# Patient Record
Sex: Female | Born: 1945
Health system: Southern US, Community
[De-identification: ages and names within clinical notes are randomized; demographics above are authoritative.]

## PROBLEM LIST (undated history)

## (undated) DIAGNOSIS — F32A Depression, unspecified: Secondary | ICD-10-CM

## (undated) DIAGNOSIS — M779 Enthesopathy, unspecified: Secondary | ICD-10-CM

## (undated) DIAGNOSIS — I2510 Atherosclerotic heart disease of native coronary artery without angina pectoris: Secondary | ICD-10-CM

## (undated) DIAGNOSIS — R81 Glycosuria: Secondary | ICD-10-CM

## (undated) DIAGNOSIS — N6019 Diffuse cystic mastopathy of unspecified breast: Secondary | ICD-10-CM

## (undated) DIAGNOSIS — I219 Acute myocardial infarction, unspecified: Secondary | ICD-10-CM

## (undated) DIAGNOSIS — C439 Malignant melanoma of skin, unspecified: Secondary | ICD-10-CM

## (undated) DIAGNOSIS — I10 Essential (primary) hypertension: Secondary | ICD-10-CM

## (undated) DIAGNOSIS — K219 Gastro-esophageal reflux disease without esophagitis: Secondary | ICD-10-CM

## (undated) DIAGNOSIS — M1990 Unspecified osteoarthritis, unspecified site: Secondary | ICD-10-CM

## (undated) DIAGNOSIS — F419 Anxiety disorder, unspecified: Secondary | ICD-10-CM

## (undated) DIAGNOSIS — N920 Excessive and frequent menstruation with regular cycle: Secondary | ICD-10-CM

## (undated) DIAGNOSIS — K4090 Unilateral inguinal hernia, without obstruction or gangrene, not specified as recurrent: Secondary | ICD-10-CM

## (undated) DIAGNOSIS — F329 Major depressive disorder, single episode, unspecified: Secondary | ICD-10-CM

## (undated) HISTORY — DX: Enthesopathy, unspecified: M77.9

## (undated) HISTORY — PX: NODE DISSECTION: SHX5269

## (undated) HISTORY — DX: Unilateral inguinal hernia, without obstruction or gangrene, not specified as recurrent: K40.90

## (undated) HISTORY — PX: APPENDECTOMY: SHX54

## (undated) HISTORY — DX: Gastro-esophageal reflux disease without esophagitis: K21.9

## (undated) HISTORY — DX: Malignant melanoma of skin, unspecified: C43.9

## (undated) HISTORY — PX: OTHER SURGICAL HISTORY: SHX169

## (undated) HISTORY — PX: CHOLECYSTECTOMY: SHX55

## (undated) HISTORY — DX: Excessive and frequent menstruation with regular cycle: N92.0

## (undated) HISTORY — PX: TOTAL ABDOMINAL HYSTERECTOMY: SHX209

## (undated) HISTORY — PX: HERNIA REPAIR: SHX51

## (undated) HISTORY — DX: Glycosuria: R81

## (undated) HISTORY — DX: Diffuse cystic mastopathy of unspecified breast: N60.19

---

## 1996-05-15 HISTORY — PX: HYSTEROSCOPY: SHX211

## 1997-04-14 DIAGNOSIS — I219 Acute myocardial infarction, unspecified: Secondary | ICD-10-CM

## 1997-04-14 HISTORY — DX: Acute myocardial infarction, unspecified: I21.9

## 1997-08-22 ENCOUNTER — Other Ambulatory Visit: Admission: RE | Admit: 1997-08-22 | Discharge: 1997-08-22 | Payer: Self-pay | Admitting: Obstetrics and Gynecology

## 1997-09-26 ENCOUNTER — Inpatient Hospital Stay (HOSPITAL_COMMUNITY): Admission: RE | Admit: 1997-09-26 | Discharge: 1997-09-28 | Payer: Self-pay | Admitting: Obstetrics and Gynecology

## 1998-01-12 HISTORY — PX: CORONARY ANGIOPLASTY WITH STENT PLACEMENT: SHX49

## 1998-01-25 ENCOUNTER — Encounter: Payer: Self-pay | Admitting: Cardiology

## 1998-01-25 ENCOUNTER — Inpatient Hospital Stay (HOSPITAL_COMMUNITY): Admission: EM | Admit: 1998-01-25 | Discharge: 1998-01-27 | Payer: Self-pay | Admitting: Emergency Medicine

## 1998-02-06 ENCOUNTER — Encounter (HOSPITAL_COMMUNITY): Admission: RE | Admit: 1998-02-06 | Discharge: 1998-05-07 | Payer: Self-pay | Admitting: Cardiology

## 1998-04-14 HISTORY — PX: ANGIOPLASTY: SHX39

## 1998-08-28 ENCOUNTER — Ambulatory Visit (HOSPITAL_COMMUNITY): Admission: RE | Admit: 1998-08-28 | Discharge: 1998-08-29 | Payer: Self-pay | Admitting: Cardiology

## 1998-09-25 ENCOUNTER — Encounter (HOSPITAL_COMMUNITY): Admission: RE | Admit: 1998-09-25 | Discharge: 1998-12-24 | Payer: Self-pay | Admitting: Cardiology

## 1998-12-25 ENCOUNTER — Encounter (HOSPITAL_COMMUNITY): Admission: RE | Admit: 1998-12-25 | Discharge: 1999-03-25 | Payer: Self-pay | Admitting: Cardiology

## 1999-08-01 ENCOUNTER — Ambulatory Visit (HOSPITAL_COMMUNITY): Admission: RE | Admit: 1999-08-01 | Discharge: 1999-08-01 | Payer: Self-pay | Admitting: Obstetrics and Gynecology

## 1999-08-01 ENCOUNTER — Encounter: Payer: Self-pay | Admitting: Obstetrics and Gynecology

## 2000-05-13 ENCOUNTER — Ambulatory Visit (HOSPITAL_COMMUNITY): Admission: RE | Admit: 2000-05-13 | Discharge: 2000-05-14 | Payer: Self-pay | Admitting: Cardiology

## 2000-05-27 ENCOUNTER — Inpatient Hospital Stay (HOSPITAL_COMMUNITY): Admission: AD | Admit: 2000-05-27 | Discharge: 2000-05-30 | Payer: Self-pay | Admitting: Cardiology

## 2000-05-28 ENCOUNTER — Encounter: Payer: Self-pay | Admitting: Cardiology

## 2000-05-29 ENCOUNTER — Encounter: Payer: Self-pay | Admitting: Cardiology

## 2000-06-09 ENCOUNTER — Encounter (HOSPITAL_COMMUNITY): Admission: RE | Admit: 2000-06-09 | Discharge: 2000-07-31 | Payer: Self-pay | Admitting: Cardiology

## 2000-11-23 ENCOUNTER — Other Ambulatory Visit: Admission: RE | Admit: 2000-11-23 | Discharge: 2000-11-23 | Payer: Self-pay | Admitting: Obstetrics and Gynecology

## 2001-05-16 ENCOUNTER — Emergency Department (HOSPITAL_COMMUNITY): Admission: EM | Admit: 2001-05-16 | Discharge: 2001-05-16 | Payer: Self-pay | Admitting: Emergency Medicine

## 2001-05-23 ENCOUNTER — Encounter: Payer: Self-pay | Admitting: Emergency Medicine

## 2001-05-23 ENCOUNTER — Inpatient Hospital Stay (HOSPITAL_COMMUNITY): Admission: EM | Admit: 2001-05-23 | Discharge: 2001-05-24 | Payer: Self-pay | Admitting: Emergency Medicine

## 2001-05-24 ENCOUNTER — Encounter: Payer: Self-pay | Admitting: Emergency Medicine

## 2001-05-27 ENCOUNTER — Ambulatory Visit (HOSPITAL_COMMUNITY): Admission: RE | Admit: 2001-05-27 | Discharge: 2001-05-27 | Payer: Self-pay | Admitting: Cardiology

## 2002-10-13 DIAGNOSIS — K4090 Unilateral inguinal hernia, without obstruction or gangrene, not specified as recurrent: Secondary | ICD-10-CM

## 2002-10-13 HISTORY — DX: Unilateral inguinal hernia, without obstruction or gangrene, not specified as recurrent: K40.90

## 2002-10-19 ENCOUNTER — Ambulatory Visit (HOSPITAL_COMMUNITY): Admission: RE | Admit: 2002-10-19 | Discharge: 2002-10-19 | Payer: Self-pay

## 2003-04-26 ENCOUNTER — Ambulatory Visit (HOSPITAL_COMMUNITY): Admission: RE | Admit: 2003-04-26 | Discharge: 2003-04-26 | Payer: Self-pay | Admitting: Cardiology

## 2005-09-29 ENCOUNTER — Ambulatory Visit (HOSPITAL_COMMUNITY): Admission: RE | Admit: 2005-09-29 | Discharge: 2005-09-29 | Payer: Self-pay | Admitting: Gastroenterology

## 2008-10-12 DIAGNOSIS — C439 Malignant melanoma of skin, unspecified: Secondary | ICD-10-CM

## 2008-10-12 HISTORY — DX: Malignant melanoma of skin, unspecified: C43.9

## 2008-10-30 ENCOUNTER — Encounter: Admission: RE | Admit: 2008-10-30 | Discharge: 2008-10-30 | Payer: Self-pay | Admitting: General Surgery

## 2008-10-30 ENCOUNTER — Ambulatory Visit (HOSPITAL_COMMUNITY): Admission: RE | Admit: 2008-10-30 | Discharge: 2008-10-30 | Payer: Self-pay | Admitting: General Surgery

## 2008-11-01 ENCOUNTER — Encounter (INDEPENDENT_AMBULATORY_CARE_PROVIDER_SITE_OTHER): Payer: Self-pay | Admitting: General Surgery

## 2008-11-01 ENCOUNTER — Ambulatory Visit (HOSPITAL_BASED_OUTPATIENT_CLINIC_OR_DEPARTMENT_OTHER): Admission: RE | Admit: 2008-11-01 | Discharge: 2008-11-01 | Payer: Self-pay | Admitting: General Surgery

## 2008-11-20 ENCOUNTER — Ambulatory Visit: Payer: Self-pay | Admitting: Oncology

## 2008-11-30 LAB — CBC WITH DIFFERENTIAL/PLATELET
BASO%: 0.5 % (ref 0.0–2.0)
Basophils Absolute: 0 10*3/uL (ref 0.0–0.1)
EOS%: 1.2 % (ref 0.0–7.0)
Eosinophils Absolute: 0.1 10*3/uL (ref 0.0–0.5)
HCT: 43.3 % (ref 34.8–46.6)
HGB: 14.8 g/dL (ref 11.6–15.9)
LYMPH%: 33.6 % (ref 14.0–49.7)
MCH: 31.3 pg (ref 25.1–34.0)
MCHC: 34.1 g/dL (ref 31.5–36.0)
MCV: 91.7 fL (ref 79.5–101.0)
MONO#: 0.4 10*3/uL (ref 0.1–0.9)
MONO%: 6.9 % (ref 0.0–14.0)
NEUT#: 3.5 10*3/uL (ref 1.5–6.5)
NEUT%: 57.8 % (ref 38.4–76.8)
Platelets: 208 10*3/uL (ref 145–400)
RBC: 4.73 10*6/uL (ref 3.70–5.45)
RDW: 13 % (ref 11.2–14.5)
WBC: 6 10*3/uL (ref 3.9–10.3)
lymph#: 2 10*3/uL (ref 0.9–3.3)

## 2008-11-30 LAB — COMPREHENSIVE METABOLIC PANEL
ALT: 36 U/L — ABNORMAL HIGH (ref 0–35)
AST: 23 U/L (ref 0–37)
Albumin: 4.2 g/dL (ref 3.5–5.2)
Alkaline Phosphatase: 68 U/L (ref 39–117)
BUN: 13 mg/dL (ref 6–23)
CO2: 25 mEq/L (ref 19–32)
Calcium: 9.3 mg/dL (ref 8.4–10.5)
Chloride: 104 mEq/L (ref 96–112)
Creatinine, Ser: 0.63 mg/dL (ref 0.40–1.20)
Glucose, Bld: 103 mg/dL — ABNORMAL HIGH (ref 70–99)
Potassium: 3.9 mEq/L (ref 3.5–5.3)
Sodium: 142 mEq/L (ref 135–145)
Total Bilirubin: 0.9 mg/dL (ref 0.3–1.2)
Total Protein: 6.5 g/dL (ref 6.0–8.3)

## 2008-11-30 LAB — LACTATE DEHYDROGENASE: LDH: 165 U/L (ref 94–250)

## 2009-06-08 ENCOUNTER — Ambulatory Visit: Payer: Self-pay | Admitting: Oncology

## 2009-11-05 ENCOUNTER — Ambulatory Visit (HOSPITAL_COMMUNITY): Admission: RE | Admit: 2009-11-05 | Discharge: 2009-11-05 | Payer: Self-pay | Admitting: Oncology

## 2009-11-12 ENCOUNTER — Ambulatory Visit: Payer: Self-pay | Admitting: Oncology

## 2010-05-06 ENCOUNTER — Encounter: Payer: Self-pay | Admitting: General Surgery

## 2010-05-09 ENCOUNTER — Other Ambulatory Visit: Payer: Self-pay | Admitting: Dermatology

## 2010-06-05 ENCOUNTER — Other Ambulatory Visit: Payer: Self-pay | Admitting: Medical

## 2010-06-05 ENCOUNTER — Encounter (HOSPITAL_BASED_OUTPATIENT_CLINIC_OR_DEPARTMENT_OTHER): Payer: BC Managed Care – PPO | Admitting: Oncology

## 2010-06-05 DIAGNOSIS — C4360 Malignant melanoma of unspecified upper limb, including shoulder: Secondary | ICD-10-CM

## 2010-06-05 LAB — LACTATE DEHYDROGENASE: LDH: 157 U/L (ref 94–250)

## 2010-06-05 LAB — CBC WITH DIFFERENTIAL/PLATELET
BASO%: 0.4 % (ref 0.0–2.0)
Basophils Absolute: 0 10*3/uL (ref 0.0–0.1)
EOS%: 1.1 % (ref 0.0–7.0)
Eosinophils Absolute: 0.1 10*3/uL (ref 0.0–0.5)
HCT: 42.2 % (ref 34.8–46.6)
HGB: 14.6 g/dL (ref 11.6–15.9)
LYMPH%: 34.7 % (ref 14.0–49.7)
MCH: 31.6 pg (ref 25.1–34.0)
MCHC: 34.6 g/dL (ref 31.5–36.0)
MCV: 91.3 fL (ref 79.5–101.0)
MONO#: 0.3 10*3/uL (ref 0.1–0.9)
MONO%: 6.2 % (ref 0.0–14.0)
NEUT#: 3.1 10*3/uL (ref 1.5–6.5)
NEUT%: 57.6 % (ref 38.4–76.8)
Platelets: 238 10*3/uL (ref 145–400)
RBC: 4.63 10*6/uL (ref 3.70–5.45)
RDW: 13.1 % (ref 11.2–14.5)
WBC: 5.5 10*3/uL (ref 3.9–10.3)
lymph#: 1.9 10*3/uL (ref 0.9–3.3)

## 2010-06-05 LAB — COMPREHENSIVE METABOLIC PANEL
ALT: 42 U/L — ABNORMAL HIGH (ref 0–35)
AST: 32 U/L (ref 0–37)
Albumin: 3.8 g/dL (ref 3.5–5.2)
Alkaline Phosphatase: 60 U/L (ref 39–117)
BUN: 10 mg/dL (ref 6–23)
CO2: 26 mEq/L (ref 19–32)
Calcium: 9.3 mg/dL (ref 8.4–10.5)
Chloride: 103 mEq/L (ref 96–112)
Creatinine, Ser: 0.69 mg/dL (ref 0.40–1.20)
Glucose, Bld: 135 mg/dL — ABNORMAL HIGH (ref 70–99)
Potassium: 3.3 mEq/L — ABNORMAL LOW (ref 3.5–5.3)
Sodium: 138 mEq/L (ref 135–145)
Total Bilirubin: 1.1 mg/dL (ref 0.3–1.2)
Total Protein: 6.8 g/dL (ref 6.0–8.3)

## 2010-07-21 LAB — CBC
HCT: 41.8 % (ref 36.0–46.0)
Hemoglobin: 14.5 g/dL (ref 12.0–15.0)
MCHC: 34.7 g/dL (ref 30.0–36.0)
MCV: 93.3 fL (ref 78.0–100.0)
Platelets: 204 10*3/uL (ref 150–400)
RBC: 4.49 MIL/uL (ref 3.87–5.11)
RDW: 13 % (ref 11.5–15.5)
WBC: 6.2 10*3/uL (ref 4.0–10.5)

## 2010-07-21 LAB — COMPREHENSIVE METABOLIC PANEL
ALT: 40 U/L — ABNORMAL HIGH (ref 0–35)
AST: 36 U/L (ref 0–37)
Albumin: 3.7 g/dL (ref 3.5–5.2)
Alkaline Phosphatase: 62 U/L (ref 39–117)
BUN: 7 mg/dL (ref 6–23)
CO2: 26 mEq/L (ref 19–32)
Calcium: 9 mg/dL (ref 8.4–10.5)
Chloride: 105 mEq/L (ref 96–112)
Creatinine, Ser: 0.67 mg/dL (ref 0.4–1.2)
GFR calc Af Amer: >60 mL/min (ref >60)
GFR calc non Af Amer: >60 mL/min (ref >60)
Glucose, Bld: 122 mg/dL — ABNORMAL HIGH (ref 70–99)
Potassium: 3.4 mEq/L — ABNORMAL LOW (ref 3.5–5.1)
Sodium: 139 mEq/L (ref 135–145)
Total Bilirubin: 1.1 mg/dL (ref 0.3–1.2)
Total Protein: 6.5 g/dL (ref 6.0–8.3)

## 2010-07-21 LAB — DIFFERENTIAL
Basophils Absolute: 0 10*3/uL (ref 0.0–0.1)
Basophils Relative: 0 % (ref 0–1)
Eosinophils Absolute: 0.1 10*3/uL (ref 0.0–0.7)
Eosinophils Relative: 2 % (ref 0–5)
Lymphocytes Relative: 35 % (ref 12–46)
Lymphs Abs: 2.2 10*3/uL (ref 0.7–4.0)
Monocytes Absolute: 0.5 10*3/uL (ref 0.1–1.0)
Monocytes Relative: 8 % (ref 3–12)
Neutro Abs: 3.5 10*3/uL (ref 1.7–7.7)
Neutrophils Relative %: 56 % (ref 43–77)

## 2010-08-27 NOTE — Op Note (Signed)
[name], [name]               ACCOUNT NO.:  [id number]   MEDICAL RECORD NO.:  [id number]          PATIENT TYPE:  AMB   LOCATION:  DSC                          FACILITY:  [facility]   PHYSICIAN:  Juanetta Gosling, MDDATE OF BIRTH:  [birth year]   DATE OF PROCEDURE:  11/01/2008  DATE OF DISCHARGE:                               OPERATIVE REPORT   PREOPERATIVE DIAGNOSES:  1. Melanoma in situ, right arm.  2. A 1.1 mm thick right shoulder melanoma.   POSTOPERATIVE DIAGNOSES:  1. Melanoma in situ, right arm.  2. A 1.1 mm thick right shoulder melanoma.   PROCEDURES:  1. Right arm melanoma in situ excision with 0.5 cm margins.  2. Injection of methylene blue dye for sentinel lymph node biopsy.  3. Right axillary sentinel lymph node biopsy.  4. Right shoulder melanoma excision with 1-cm margins.   SURGEON:  Troy Sine. Dwain Sarna, MD   ASSISTANT:  None.   ANESTHESIA:  General.   SPECIMENS:  1. Right arm melanoma.  2. Right axillary sentinel lymph node.  3. Right axillary lymph node.  4. Right axillary tissue.  5. Right shoulder melanoma, wide local excision, all to pathology.   ESTIMATED BLOOD LOSS:  Minimal.   COMPLICATIONS:  None.   DRAINS:  None.   DISPOSITION:  To recovery room in stable condition.   HISTORY:  [name] is a 65 year old female with a history of  coronary artery disease who underwent an 8-mm punch biopsy of a right  shoulder lesion as well as a shave biopsy of her right arm lesion on  October 13, 2008.  Pathology on the right shoulder lesion was a 1.1 mm thick  melanoma with no regression, no ulceration, no satellitosis, and no  vascular invasion identified.  The margin did appear free on this  limited evaluation.  The shave biopsy on the right arm showed a melanoma  in situ.  She and I discussed excision of both of these lesions with a  right axillary sentinel lymph node biopsy.  She underwent a  lymphoscintigram preoperatively which localized to her  right axilla.  We  discussed the risks and benefits of these procedures prior to beginning.   PROCEDURE IN DETAIL:  After informed consent was obtained, the patient  was first injected with technetium surrounding her right shoulder  melanoma.  She was administered 400 mg of IV ciprofloxacin due to a  penicillin allergy and had sequential compression devices placed on her  lower extremities prior to operation.  She then had an LMA placed and  underwent general anesthesia without complication.  Her entire right arm  and right axilla were then prepped with ChloraPrep.  She was then draped  in a standard sterile surgical fashion including a stockinette after 3  minutes were allowed to pass.  A surgical time-out was then performed.  Prior to this also, I injected 1 mL of methylene blue surrounding the  melanoma on the right shoulder.  I first excised the melanoma in situ in the right forearm with 0.5 cm  margins.  This was excised in total.  This was  then closed with a layer  of 3-0 Vicryl followed by 4-0 Monocryl.  Steri-Strips were placed  overlying this wound.  I then made an incision in her right axilla where it appeared that a  sentinel node was.  This was about 3-cm incision due to her body  habitus.  This dissection was carried out down into the axilla.  I  noticed two abnormal lymph nodes that I removed.  One of these appeared  to be the sentinel lymph node.  I also noticed one other smaller lymph  node upon evaluation and removed this is as a nonsentinel lymph node.  There were no background counts upon completion of this.  I did not  locate any other blue nodes in her axilla either.  This was closed with  a 2-0 Vicryl for the deep layer for the axillary fascia and then a 4-0  Monocryl was used to close the skin.  Steri-Strips and sterile dressing  were then placed.  She was then repositioned in the lateral position at about 45 degrees  with her arm down by her side and  appropriately padded.  I marked out 1-  cm margins surrounding the melanoma that she had.  There was a larger  area and a nodule present.  I went 1 cm out from the edges of the larger  area.  She had very much requested and I tried not to put this incision  where her bra strap would be resting.  This did not necessarily go with  the resting skin tension lines but I did make an attempt to put this out  of where her bra strap would reside upon completion of this.  This was  about a 3 x 9 cm elliptical incision down to level of the fascia that  was passed off the table.  I then closed this in 3 layers with a 2-0  Vicryl after developing flaps in all directions.  The dermis was then  closed with a 3-0 Vicryl and the skin was closed with a 2-0 nylon  vertical mattress sutures to bring this together.  Bacitracin and a  sterile dressing were then placed over this.  She was then rolled  supine, had her LMA removed, having tolerated this well.  She was  transferred to the recovery room in stable condition.      Juanetta Gosling, MD  Electronically Signed     MCW/MEDQ  D:  11/01/2008  T:  11/02/2008  Job:  [id number]   cc:   Venancio Poisson, M.D.  Vikki Ports, M.D.

## 2010-08-30 NOTE — H&P (Signed)
[place]. [facility]  Patient:    [name], BIER Visit Number: [id number] MRN: [id number]          Service Type: Attending:  Meade Maw, M.D. Dictated by:   Meade Maw, M.D.   CC:         Francisca December, M.D.   History and Physical  PRIMARY CARDIOLOGIST:  Dr. Francisca December.  REASON FOR ADMISSION:  Unstable angina.  HISTORY:  [name] is a 65 year old female with known coronary artery disease.  Her last hospital admission of February of 2002.  At this time, she was found to have significant disease in the ramus intermediate requiring PTCA and cutting balloon as well as PTCA with cutting balloon of the left circumflex vessel.  She has been doing well since this admission, last saw Dr. Amil Amen in January of 2003; at this time, he discontinued her Isordil mononitrate and started Isordil dinitrate.  The night prior to admission, she awoke at 12:15 with substernal chest pain which radiated to the back.  This pain was not entirely like her previous pain, in that it was more epigastric rather than lower sternal, however, she took several nitroglycerin sprays, obtained some relief and continued to use nitroglycerin spray throughout the night; she used a total of one-half bottle of nitroglycerin spray.  Currently, she is pain-free.  REVIEW OF SYSTEMS:  She feels as if her heart races at night.  She has had some shortness of breath.  She has had no pedal edema.  She had a fall this past Sunday which resulted in trauma to her left eye and her left knee.  This has subsequently prevented her normal activities this past week.  Her activity has been limited by left knee pain.  She has been compliant with her current medications.  There was associated nausea with the chest pain but no emesis. There was no orthopnea, no PND, no change in bowel or bladder habits.  CURRENT MEDICATIONS: 1. Toprol 100 mg p.o. q.d. 2. Lipitor 10 mg p.o. q.d. 3. Protonix 40  mg p.o. q.d. 4. Hydrochlorothiazide 12.5 mg p.o. q.d. 5. Estrogen q.d. 6. Isosorbide dinitrate 40 mg b.i.d. 7. Norvasc 5 mg q.d. 8. Aspirin 325 mg q.d.   ALLERGIES: 1. MORPHINE SULFATE. 2. TETANUS TOXOID. 3. AZITHROMYCIN.  PAST SURGICAL HISTORY: 1. Coronary artery disease, status post coronary angiography as noted above. 2. Appendectomy. 3. Cholecystectomy. 4. Hysterectomy with unilateral oophorectomy.  FAMILY HISTORY:  Mother passed at age 49 of stroke.  Her first cardiovascular event was in her 45s.  The patients father has coronary artery disease, status post MI in his 60s.  SOCIAL HISTORY:  No history of alcohol, tobacco or illicit drug use.  The patient works as a Geologist, engineering for a second grade.  PHYSICAL EXAMINATION:  GENERAL:  Exam reveals a middle-aged obese female currently pain-free.  VITAL SIGNS:  Blood pressure is 136/84.  Heart rate is 88.  She is afebrile.  HEENT:  Remarkable for significant ecchymosis of her left eye and left periorbital swelling.  Of note, she has been evaluated by an ophthalmologist this past weeks and was informed that her eye was okay.  NECK:  There is no neck vein distention, no bruits.  Good carotid upstrokes. Thyroid is not palpable.  CARDIOVASCULAR:  Normal S1, normal S2, regular rate and rhythm.  No murmurs, rubs, or gallops are noted.  PMI is difficult to palpate secondary to pendulous breasts.  ABDOMEN:  Obese.  No unusual bruits or  pulsations are noted.  Normal bowel sounds.  EXTREMITIES:  No clubbing, cyanosis or edema.  Good capillary refill.  NEUROLOGIC:  Nonfocal.  Motor is 5/5 throughout.  SKIN:  Warm and dry.  LABORATORY AND ACCESSORY DATA:  White count is 7.2, hemoglobin is 13, platelet count is 227,000; sodium is 138, potassium is 3.4, creatinine is 0.9; pH is 7.42 with a PCO2 of 46.  ECG reveals a normal sinus rhythm, normal ECG.  Chest x-ray is within normal limits.  IMPRESSION: 33. A 65 year old  female with current chest/epigastric pain radiating to her    back, not entirely like her previous anginal pain but similar enough to    promote her to use one-half bottle of nitroglycerin spray.  Currently, she    is pain-free.  Her cardiac workup has been negative.  She will be admitted    for further observation.  Serial cardiac enzymes will be obtained.  Further    workup at the discretion of Dr. Amil Amen. 2. Gastroesophageal reflux.  We will continue with her Protonix 40 mg daily. 3. Dyslipidemia.  We will continue with her Lipitor 10 mg daily. 4. Hypertension.  Her blood pressure currently is well-controlled on her    current regimen. 5. Mild hypokalemia.  She will be started on potassium 20 mEq daily.Dictated by:   Meade Maw, M.D. Attending:  Meade Maw, M.D. DD:  05/23/01 TD:  05/23/01 Job: [id number]

## 2010-08-30 NOTE — Cardiovascular Report (Signed)
[place]. [facility]  Patient:    [name], [name] Visit Number: [id number] MRN: [id number]          Service Type: Attending:  Francisca December, M.D. Dictated by:   Francisca December, M.D. Proc. Date: 05/27/01   CC:         Vikki Ports, M.D.  Cardiac Catheterization Laboratory   Cardiac Catheterization  PROCEDURES PERFORMED: 1. Left heart catheterization. 2. Coronary angiography 3. Left ventriculogram. 4. Right femoral artery percutaneous closure.  INDICATIONS:  The patient is a 65 year old woman who has previously undergone percutaneous revascularization of the LAD with in-stent restenosis treated in May 2000.  On May 13, 2000, she underwent cutting balloon angioplasty of the ramus intermedius and proximal left circumflex.  She was admitted five days ago with a prolonged episode of unstable angina.  A myocardial infarction was ruled out.  She underwent a myocardial perfusion study which showed reversible lateral wall defect.  She is brought now to the catheterization laboratory to identify possible progressive disease, late restenosis, and provide further therapeutic options.  DESCRIPTION OF PROCEDURE:  Left heart catheterization was performed following the percutaneous insertion of a 5 French catheter sheath utilizing the an anterior approach over a guiding J-wire into the right femoral artery.  A 110 cm pigtail catheter was used to measure pressures in the ascending aorta and in the left ventricle both prior to and following the ventriculogram.  A 30 degree cine left ventriculogram was performed utilizing a power injector. Following the sublingual administration of 0.4 mg of nitroglycerin coronary angiography was performed using 5 [place] #4 left and right Judkins catheters. Cine angiography of each coronary artery was conducted in multiple LAD and RAO projections.  At the completion of the procedure, the catheter sheath was removed and the  arteriotomy site was percutaneously closed using the Perclose system.  A right femoral angiogram documented the arteriotomy site to be well above the bifurcation of the profunda femoris and the superficial femoral arteries.  There was good hemostasis and an intact distal pulse at the completion.  HEMODYNAMICS: Systemic arterial pressure was 134/72 with a mean of 99 mmHg.  There was no systolic gradient across the aortic valve.  The left ventricular and end-diastolic pressure was 6-10 mmHg.  CORONARY ANGIOGRAPHY:  The left ventriculogram demonstrated normal chamber size and normal global systolic function with a visual estimated ejection fraction of 75%.  There was no mitral regurgitation and no coronary calcification seen.  A stent was easily visible in the mid portion of the left anterior descending artery.  There was a right dominant coronary system and the left main coronary artery was normal.  The left anterior descending artery and its branches appeared normal. There was stented segment in the mid portion of the origin of the large diagonal branch and it was widely patent. The large diagonal branch itself was widely patent without any significant obstruction.  The ongoing anterior descending artery was large, barely traversed the apex and without significant obstruction.  There was a 20% eccentric stenosis in the LAD opposite the origin of the large diagonal branch.  The left circumflex coronary artery and its branches were mildly diseased. The vessel contains a proximal 30% stenosis which starts just beyond the ostium and the site of previous angioplasty.  There was a large first marginal branch/ramus intermedius which has a 50% stenosis at its origin.  The ongoing circumflex gives rise to a small third marginal branch and a moderate to large size  true obtuse marginal branch, all of which are without significant obstruction.  The right coronary artery and its branches are  again without significant obstruction and really appears generally normal.  There is a 20% stenosis in the proximal portion of the vessel.  The ongoing right coronary artery shows no obstruction or luminal irregularity.  It gives rise to a large moderate size posterior descending artery which has a very proximal tandem smaller branch.  The posterior lateral segment is small and gives rise to a small posterior lateral branch only.  FINAL IMPRESSION: 1. Atherosclerotic coronary vascular disease, two vessel. 2. Widely patent stent side, mid left anterior descending. 3. Widely patent percutaneous transluminal coronary angioplasty site of    proximal/ostial left circumflex. 4. Mild restenosis ostium, ramus intermedius.  PLAN/RECOMMENDATION:  The patient is presented with this gratifying news.  The reversible defect on the Cardiolite was falsely positive.  It was more wide spread than could be explained by this small ramus intermedius 50% stenosis. Medical therapy only is indicated. Dictated by:   Francisca December, M.D. Attending:  Francisca December, M.D. DD:  05/27/01 TD:  05/28/01 Job: [id number]

## 2010-08-30 NOTE — Discharge Summary (Signed)
[place]. [facility]  Patient:    [name], [name]                      MRN: [id number] Adm. Date:  [id number] Disc. Date: [id number] Attending:  Corliss Marcus Dictator:   Gwenlyn Perking, M.D.                           Discharge Summary  ADMISSION DIAGNOSES: 1. Atherosclerotic coronary vascular disease two vessel with one week history    of unstable angina status post percutaneous transluminal coronary    angioplasty and cutting balloon ramus intermediate May 13, 2000, status    post percutaneous transluminal coronary angioplasty cutting balloon    proximal left circumflex May 13, 2000, status post percutaneous    transluminal coronary angioplasty and stent in the left 1999, and status    post percutaneous transluminal coronary angioplasties for in-stent    restenosis in May 2000. 2. Gastroesophageal reflux disease. 3. Dyslipidemia. 4. Obesity. 5. Positive treadmill exercise study May 11, 2000. 6. Osteoarthritis.  DISCHARGE DIAGNOSES: 1. Atherosclerotic coronary vascular disease status post cardiac    catheterization. 2. Gastroesophageal reflux. 3. Dyslipidemia. 4. Obesity. 5. Osteoarthritis.  CONSULTS:  None.  PROCEDURE:  Cardiac catheterization May 28, 2000.  HISTORY AND PHYSICAL EXAMINATION:  See admission history and physical.  HOSPITAL COURSE:  The patient was admitted on May 27, 2000 with a recent cardiac catheterization May 13, 2000 with a one week history of changing in pattern of her angina.  This was felt to be worrisome on admission in a patient with known coronary artery disease despite a recent catheterization. Therefore, the patient was admitted and in fact a cardiac catheterization was performed on May 28, 2000 again which revealed the following:  Likely chronic recurrent and persistent left main coronary artery spasm following cutting balloon dilatation of the ostium, the left circumflex, and  proximal first marginal.  Reviewing the angiograms from two weeks ago when she had the previous cardiac catheterization there was extensive manipulation of the catheter in the ostial portion of the left main with some balloon overlap into the left main from the left circumflex.  Furthermore, atherosclerotic coronary vascular disease two vessel widely patent was observed.  Non-obstructive fixed atherosclerosis of the left main coronary artery possibly worsened by "mixed angina" and an overall intact left ventricular size and systolic function were demonstrated.  Patient tolerated the cardiac catheterization well, however, topical nitroglycerin was added to her medical regimen of a beta blocker and ______.  It was felt by Dr. Amil Amen that the patient may require higher doses of the ______ as previously prescribed.  Patient was also instructed after presented with these findings that self administration of any potential vasoconstrictors to be avoided.  Furthermore, an exercise treadmill test was advised with Cardiolite myocardial perfusion imaging to rule out any occult stenosis not visualized on the cardiac catheterization which was performed on May 28, 2000.  During her hospitalization the patient did experience some nausea which was readily responsive to Zofran at first and Phenergan later. On May 30, 2000 it was decided that patient had benefited maximally from this hospital admission and could be discharged home safely after she was instructed that she needed to have the Cardiolite at a later date.  We decided to bring her back as an outpatient for a Cardiolite secondary to the nausea that the patient had experienced so on May 30, 2000 it was  decided that patient had benefited maximally from this hospital admission and could be discharged home safely.  CONDITION ON DISCHARGE:  Stable.  DISPOSITION:  Discharged patient to home.  DISCHARGE MEDICATIONS: 1. Metoprolol 100  mg one p.o. q.d. 2. Protonix 40 mg p.o. q.d. 3. Hydrochlorothiazide 12.5 mg p.o. q.d. 4. Enteric coated aspirin 325 mg one p.o. q.d. 5. Lipitor 20 mg one p.o. q.h.s. 6. Norvasc 5 mg one p.o. q.d. 7. Potassium chloride 20 mEq one p.o. q.d.  ACTIVITY:  No driving, no lifting for one week.  DIET:  Low fat, low salt.  Symptoms to warrant further treatment:  Should the patient experience any intense pain at the catheterization site, swelling, or bleeding she is to call the office immediately.  She is also to call the office if she experiences further chest pain or some to the nearest emergency room for evaluation and treatment.  FOLLOW-UP:  The patient was scheduled to come back for a followup visit with Dr. Amil Amen on June 05, 2000 at 3:15. DD:  07/21/00 TD:  07/21/00 Job: [id number]

## 2010-11-07 ENCOUNTER — Other Ambulatory Visit: Payer: Self-pay | Admitting: Dermatology

## 2010-12-04 ENCOUNTER — Ambulatory Visit (HOSPITAL_COMMUNITY)
Admission: RE | Admit: 2010-12-04 | Discharge: 2010-12-04 | Disposition: A | Discharge status: Home / Self Care | Payer: BC Managed Care – PPO | Source: Ambulatory Visit | Attending: Oncology | Admitting: Oncology

## 2010-12-04 ENCOUNTER — Other Ambulatory Visit: Payer: Self-pay | Admitting: Oncology

## 2010-12-04 ENCOUNTER — Encounter (HOSPITAL_BASED_OUTPATIENT_CLINIC_OR_DEPARTMENT_OTHER): Payer: BC Managed Care – PPO | Admitting: Oncology

## 2010-12-04 DIAGNOSIS — C801 Malignant (primary) neoplasm, unspecified: Secondary | ICD-10-CM | POA: Insufficient documentation

## 2010-12-04 DIAGNOSIS — C4360 Malignant melanoma of unspecified upper limb, including shoulder: Secondary | ICD-10-CM

## 2010-12-04 LAB — CBC WITH DIFFERENTIAL/PLATELET
BASO%: 0.5 % (ref 0.0–2.0)
Basophils Absolute: 0 10*3/uL (ref 0.0–0.1)
EOS%: 1.7 % (ref 0.0–7.0)
Eosinophils Absolute: 0.1 10*3/uL (ref 0.0–0.5)
HCT: 42.8 % (ref 34.8–46.6)
HGB: 14.8 g/dL (ref 11.6–15.9)
LYMPH%: 28.9 % (ref 14.0–49.7)
MCH: 32 pg (ref 25.1–34.0)
MCHC: 34.5 g/dL (ref 31.5–36.0)
MCV: 92.7 fL (ref 79.5–101.0)
MONO#: 0.5 10*3/uL (ref 0.1–0.9)
MONO%: 8.6 % (ref 0.0–14.0)
NEUT#: 3.8 10*3/uL (ref 1.5–6.5)
NEUT%: 60.3 % (ref 38.4–76.8)
Platelets: 229 10*3/uL (ref 145–400)
RBC: 4.62 10*6/uL (ref 3.70–5.45)
RDW: 13.2 % (ref 11.2–14.5)
WBC: 6.2 10*3/uL (ref 3.9–10.3)
lymph#: 1.8 10*3/uL (ref 0.9–3.3)

## 2010-12-04 LAB — COMPREHENSIVE METABOLIC PANEL
ALT: 37 U/L — ABNORMAL HIGH (ref 0–35)
AST: 29 U/L (ref 0–37)
Albumin: 4.2 g/dL (ref 3.5–5.2)
Alkaline Phosphatase: 62 U/L (ref 39–117)
BUN: 9 mg/dL (ref 6–23)
CO2: 26 mEq/L (ref 19–32)
Calcium: 8.9 mg/dL (ref 8.4–10.5)
Chloride: 103 mEq/L (ref 96–112)
Creatinine, Ser: 0.73 mg/dL (ref 0.50–1.10)
Glucose, Bld: 111 mg/dL — ABNORMAL HIGH (ref 70–99)
Potassium: 3.6 mEq/L (ref 3.5–5.3)
Sodium: 140 mEq/L (ref 135–145)
Total Bilirubin: 0.8 mg/dL (ref 0.3–1.2)
Total Protein: 6.7 g/dL (ref 6.0–8.3)

## 2010-12-04 LAB — LACTATE DEHYDROGENASE: LDH: 185 U/L (ref 94–250)

## 2010-12-13 ENCOUNTER — Encounter (HOSPITAL_BASED_OUTPATIENT_CLINIC_OR_DEPARTMENT_OTHER): Payer: BC Managed Care – PPO | Admitting: Oncology

## 2010-12-13 DIAGNOSIS — C4360 Malignant melanoma of unspecified upper limb, including shoulder: Secondary | ICD-10-CM

## 2011-04-25 ENCOUNTER — Telehealth: Payer: Self-pay | Admitting: Oncology

## 2011-04-25 NOTE — Telephone Encounter (Signed)
L/M ON VM RE 2/28 APPT,MAILED AS WELL   AOM PER POF FROM 12/13/10

## 2011-06-12 ENCOUNTER — Other Ambulatory Visit: Payer: Medicare Other | Admitting: Lab

## 2011-06-12 ENCOUNTER — Telehealth: Payer: Self-pay | Admitting: Oncology

## 2011-06-12 ENCOUNTER — Ambulatory Visit (HOSPITAL_BASED_OUTPATIENT_CLINIC_OR_DEPARTMENT_OTHER): Payer: Medicare Other | Admitting: Oncology

## 2011-06-12 VITALS — BP 115/65 | HR 67 | Temp 97.1°F | Ht 62.8 in | Wt 226.5 lb

## 2011-06-12 DIAGNOSIS — E785 Hyperlipidemia, unspecified: Secondary | ICD-10-CM

## 2011-06-12 DIAGNOSIS — I10 Essential (primary) hypertension: Secondary | ICD-10-CM

## 2011-06-12 DIAGNOSIS — C439 Malignant melanoma of skin, unspecified: Secondary | ICD-10-CM

## 2011-06-12 LAB — CBC WITH DIFFERENTIAL/PLATELET
BASO%: 0.5 % (ref 0.0–2.0)
Basophils Absolute: 0 10*3/uL (ref 0.0–0.1)
EOS%: 1.7 % (ref 0.0–7.0)
Eosinophils Absolute: 0.1 10*3/uL (ref 0.0–0.5)
HCT: 43.1 % (ref 34.8–46.6)
HGB: 14.6 g/dL (ref 11.6–15.9)
LYMPH%: 35.9 % (ref 14.0–49.7)
MCH: 31.5 pg (ref 25.1–34.0)
MCHC: 33.9 g/dL (ref 31.5–36.0)
MCV: 92.9 fL (ref 79.5–101.0)
MONO#: 0.5 10*3/uL (ref 0.1–0.9)
MONO%: 8 % (ref 0.0–14.0)
NEUT#: 3.3 10*3/uL (ref 1.5–6.5)
NEUT%: 53.9 % (ref 38.4–76.8)
Platelets: 225 10*3/uL (ref 145–400)
RBC: 4.64 10*6/uL (ref 3.70–5.45)
RDW: 13.7 % (ref 11.2–14.5)
WBC: 6.1 10*3/uL (ref 3.9–10.3)
lymph#: 2.2 10*3/uL (ref 0.9–3.3)

## 2011-06-12 LAB — COMPREHENSIVE METABOLIC PANEL
ALT: 42 U/L — ABNORMAL HIGH (ref 0–35)
AST: 30 U/L (ref 0–37)
Albumin: 4.2 g/dL (ref 3.5–5.2)
Alkaline Phosphatase: 66 U/L (ref 39–117)
BUN: 11 mg/dL (ref 6–23)
CO2: 28 mEq/L (ref 19–32)
Calcium: 9.5 mg/dL (ref 8.4–10.5)
Chloride: 101 mEq/L (ref 96–112)
Creatinine, Ser: 0.72 mg/dL (ref 0.50–1.10)
Glucose, Bld: 105 mg/dL — ABNORMAL HIGH (ref 70–99)
Potassium: 3.9 mEq/L (ref 3.5–5.3)
Sodium: 140 mEq/L (ref 135–145)
Total Bilirubin: 1 mg/dL (ref 0.3–1.2)
Total Protein: 6.6 g/dL (ref 6.0–8.3)

## 2011-06-12 LAB — LACTATE DEHYDROGENASE: LDH: 186 U/L (ref 94–250)

## 2011-06-12 NOTE — Progress Notes (Signed)
Hematology and Oncology Follow Up Visit  [name] [id number] Mar 04, 1946 66 y.o. 06/12/2011 2:59 PM  CC: Juanetta Gosling, MD Rebecca Der, MD [name], M.D. Rebecca Poisson, MD    Principle Diagnosis: This is a 66 year old female with a diagnosis of T2 N0, stage IB superficial spreading melanoma diagnosed July 2010.   Prior Therapy:  :  Status post wide excision followed by sentinel node biposy. Her final Clark's a level was IV with 1.4-mm depth of invasion.  Current therapy: Observation, surveillance.  Interim History: [name] presents today for a followup visit.  She has continued to do very well without any evidence to suggest relapsed or recurrent disease for her melanoma.  She had not reported any symptoms of abdominal distension or pain at this point.  She had not reported any skin rashes or lesions.  Had not had any recent change in her performance status or activity level. No skin lesions noted. She is continue to follow with Dr. Nicholas Lose  Medications: I have reviewed the patient's current medications. No current outpatient prescriptions on file.  Allergies: Allergies not on file  Past Medical History, Surgical history, Social history, and Family History were reviewed and updated.  Review of Systems: Constitutional:  Negative for fever, chills, night sweats, anorexia, weight loss, pain. Cardiovascular: no chest pain or dyspnea on exertion Respiratory: no cough, shortness of breath, or wheezing Neurological: no TIA or stroke symptoms Dermatological: negative ENT: negative Skin: Negative. Gastrointestinal: no abdominal pain, change in bowel habits, or black or bloody stools Genito-Urinary: no dysuria, trouble voiding, or hematuria Hematological and Lymphatic: negative Breast: negative Musculoskeletal: negative Remaining ROS negative. Physical Exam: Blood pressure 115/65, pulse 67, temperature 97.1 F (36.2 C), temperature source Oral, height 5'  2.8" (1.595 m), weight 226 lb 8 oz (102.74 kg). ECOG: 0 General appearance: alert Head: Normocephalic, without obvious abnormality, atraumatic Neck: no adenopathy, no carotid bruit, no JVD, supple, symmetrical, trachea midline and thyroid not enlarged, symmetric, no tenderness/mass/nodules Lymph nodes: Cervical, supraclavicular, and axillary nodes normal. Heart:regular rate and rhythm, S1, S2 normal, no murmur, click, rub or gallop Lung:chest clear, no wheezing, rales, normal symmetric air entry Abdomin: soft, non-tender, without masses or organomegaly EXT:no erythema, induration, or nodules   Lab Results: Lab Results  Component Value Date   WBC 6.1 06/12/2011   HGB 14.6 06/12/2011   HCT 43.1 06/12/2011   MCV 92.9 06/12/2011   PLT 225 06/12/2011     Chemistry      Component Value Date/Time   NA 140 12/04/2010 1321   K 3.6 12/04/2010 1321   CL 103 12/04/2010 1321   CO2 26 12/04/2010 1321   BUN 9 12/04/2010 1321   CREATININE 0.73 12/04/2010 1321      Component Value Date/Time   CALCIUM 8.9 12/04/2010 1321   ALKPHOS 62 12/04/2010 1321   AST 29 12/04/2010 1321   ALT 37* 12/04/2010 1321   BILITOT 0.8 12/04/2010 1321       Impression and Plan:  This is a 66 year old female with the following issues: 1. Stage T2 N0, stage IB Clark's level IV with depth of invasion 1.4 mm, status post wide excision.  No evidence of any metastatic disease at this point.  Plan is to continue with active surveillance, including a physical examination, liver function tests and x-rays on an annual basis.  She will continue to have dermatological examination every 6 months. 2. History of hypertension and hyperlipidemia, followed by primary care physician.  Fani Rotondo,  MD 2/28/20132:59 PM

## 2011-06-12 NOTE — Telephone Encounter (Signed)
Gv pt appt for aug2013.  Gv pt a copy of cxr orders for aug2013 @ [facility]

## 2011-11-27 ENCOUNTER — Telehealth: Payer: Self-pay | Admitting: Oncology

## 2011-11-27 ENCOUNTER — Ambulatory Visit (HOSPITAL_COMMUNITY)
Admission: RE | Admit: 2011-11-27 | Discharge: 2011-11-27 | Disposition: A | Discharge status: Home / Self Care | Payer: MEDICARE | Source: Ambulatory Visit | Attending: Oncology | Admitting: Oncology

## 2011-11-27 DIAGNOSIS — R059 Cough, unspecified: Secondary | ICD-10-CM | POA: Insufficient documentation

## 2011-11-27 DIAGNOSIS — R05 Cough: Secondary | ICD-10-CM | POA: Insufficient documentation

## 2011-11-27 DIAGNOSIS — R0602 Shortness of breath: Secondary | ICD-10-CM | POA: Insufficient documentation

## 2011-11-27 DIAGNOSIS — C439 Malignant melanoma of skin, unspecified: Secondary | ICD-10-CM

## 2011-11-27 NOTE — Telephone Encounter (Signed)
Moved 8/21 appt to 9/25. lmonvm for pt and mailed schedule.

## 2011-12-03 ENCOUNTER — Other Ambulatory Visit: Payer: Medicare Other | Admitting: Lab

## 2011-12-03 ENCOUNTER — Ambulatory Visit: Payer: Medicare Other | Admitting: Oncology

## 2011-12-24 ENCOUNTER — Other Ambulatory Visit: Payer: Self-pay | Admitting: Dermatology

## 2012-01-07 ENCOUNTER — Telehealth: Payer: Self-pay | Admitting: Oncology

## 2012-01-07 ENCOUNTER — Ambulatory Visit (HOSPITAL_BASED_OUTPATIENT_CLINIC_OR_DEPARTMENT_OTHER): Payer: Medicare Other | Admitting: Oncology

## 2012-01-07 ENCOUNTER — Other Ambulatory Visit (HOSPITAL_BASED_OUTPATIENT_CLINIC_OR_DEPARTMENT_OTHER): Payer: Medicare Other | Admitting: Lab

## 2012-01-07 VITALS — BP 127/70 | HR 60 | Temp 97.0°F | Resp 20 | Ht 62.8 in | Wt 220.1 lb

## 2012-01-07 DIAGNOSIS — C439 Malignant melanoma of skin, unspecified: Secondary | ICD-10-CM

## 2012-01-07 DIAGNOSIS — C4360 Malignant melanoma of unspecified upper limb, including shoulder: Secondary | ICD-10-CM

## 2012-01-07 LAB — COMPREHENSIVE METABOLIC PANEL (CC13)
ALT: 30 U/L (ref 0–55)
AST: 21 U/L (ref 5–34)
Albumin: 3.7 g/dL (ref 3.5–5.0)
Alkaline Phosphatase: 64 U/L (ref 40–150)
BUN: 13 mg/dL (ref 7.0–26.0)
CO2: 28 mEq/L (ref 22–29)
Calcium: 9.5 mg/dL (ref 8.4–10.4)
Chloride: 102 mEq/L (ref 98–107)
Creatinine: 0.8 mg/dL (ref 0.6–1.1)
Glucose: 101 mg/dl — ABNORMAL HIGH (ref 70–99)
Potassium: 3.4 mEq/L — ABNORMAL LOW (ref 3.5–5.1)
Sodium: 141 mEq/L (ref 136–145)
Total Bilirubin: 0.8 mg/dL (ref 0.20–1.20)
Total Protein: 6.4 g/dL (ref 6.4–8.3)

## 2012-01-07 LAB — CBC WITH DIFFERENTIAL/PLATELET
BASO%: 0.7 % (ref 0.0–2.0)
Basophils Absolute: 0 10*3/uL (ref 0.0–0.1)
EOS%: 1.8 % (ref 0.0–7.0)
Eosinophils Absolute: 0.1 10*3/uL (ref 0.0–0.5)
HCT: 43.6 % (ref 34.8–46.6)
HGB: 14.8 g/dL (ref 11.6–15.9)
LYMPH%: 33.3 % (ref 14.0–49.7)
MCH: 31.6 pg (ref 25.1–34.0)
MCHC: 34.1 g/dL (ref 31.5–36.0)
MCV: 92.7 fL (ref 79.5–101.0)
MONO#: 0.6 10*3/uL (ref 0.1–0.9)
MONO%: 8.5 % (ref 0.0–14.0)
NEUT#: 4.1 10*3/uL (ref 1.5–6.5)
NEUT%: 55.7 % (ref 38.4–76.8)
Platelets: 191 10*3/uL (ref 145–400)
RBC: 4.7 10*6/uL (ref 3.70–5.45)
RDW: 13.4 % (ref 11.2–14.5)
WBC: 7.4 10*3/uL (ref 3.9–10.3)
lymph#: 2.5 10*3/uL (ref 0.9–3.3)

## 2012-01-07 LAB — LACTATE DEHYDROGENASE (CC13): LDH: 178 U/L (ref 125–220)

## 2012-01-07 NOTE — Progress Notes (Signed)
Hematology and Oncology Follow Up Visit  [name] [id number] 1946-03-05 66 y.o. 01/07/2012 9:35 AM  CC: Rebecca Gosling, MD Rebecca Der, MD [name], M.D. Rebecca Poisson, MD    Principle Diagnosis: This is a 66 year old female with a diagnosis of T2 N0, stage IB superficial spreading melanoma diagnosed July 2010.  Prior Therapy:  :  Status post wide excision followed by sentinel node biposy. Her final Clark's a level was IV with 1.4-mm depth of invasion.  Current therapy: Observation, surveillance.  Interim History: [name] presents today for a followup visit.  She has continued to do very well without any evidence to suggest relapsed or recurrent disease for her melanoma.  She had not reported any symptoms of abdominal distension or pain at this point.  She had not reported any skin rashes or lesions.  Had not had any recent change in her performance status or activity level. No skin lesions noted. She is continue to follow with Dr. Nicholas Tran. She has not reported any abdominal pain or discomfort.   Medications: I have reviewed the patient's current medications. Current outpatient prescriptions:amLODipine (NORVASC) 10 MG tablet, Take 10 mg by mouth daily., Disp: , Rfl: ;  aspirin 325 MG tablet, Take 325 mg by mouth daily., Disp: , Rfl: ;  atorvastatin (LIPITOR) 20 MG tablet, Take 20 mg by mouth daily., Disp: , Rfl: ;  calcium citrate (CALCITRATE - DOSED IN MG ELEMENTAL CALCIUM) 950 MG tablet, Take 1 tablet by mouth daily., Disp: , Rfl:  estradiol (ESTRACE) 0.5 MG tablet, Take 0.5 mg by mouth daily., Disp: , Rfl: ;  glucosamine-chondroitin 500-400 MG tablet, Take 1 tablet by mouth 2 (two) times daily., Disp: , Rfl: ;  hydrochlorothiazide (HYDRODIURIL) 25 MG tablet, Take 25 mg by mouth daily., Disp: , Rfl: ;  isosorbide mononitrate (IMDUR) 120 MG 24 hr tablet, Take 120 mg by mouth daily., Disp: , Rfl:  lisinopril (PRINIVIL,ZESTRIL) 10 MG tablet, Take 10 mg by mouth  daily., Disp: , Rfl: ;  metoprolol (LOPRESSOR) 100 MG tablet, Take 100 mg by mouth 2 (two) times daily., Disp: , Rfl: ;  Multiple Vitamin (MULTIVITAMIN) tablet, Take 1 tablet by mouth daily., Disp: , Rfl: ;  omeprazole (PRILOSEC) 20 MG capsule, Take 20 mg by mouth 2 (two) times daily., Disp: , Rfl:  potassium chloride SA (K-DUR,KLOR-CON) 20 MEQ tablet, Take 20 mEq by mouth daily. , Disp: , Rfl: ;  Vitamin D, Ergocalciferol, (DRISDOL) 50000 UNITS CAPS, Take 50,000 Units by mouth. Every other week, Disp: , Rfl: ;  NITROSTAT 0.4 MG SL tablet, Place 0.4 mg under the tongue., Disp: , Rfl:   Allergies:  Allergies  Allergen Reactions  . Azithromycin   . Morphine And Related   . Other     Horse products  . Penicillins   . Tetanus Toxoids     Past Medical History, Surgical history, Social history, and Family History were reviewed and updated.  Review of Systems: Constitutional:  Negative for fever, chills, night sweats, anorexia, weight loss, pain. Cardiovascular: no chest pain or dyspnea on exertion Respiratory: no cough, shortness of breath, or wheezing Neurological: no TIA or stroke symptoms Dermatological: negative ENT: negative Skin: Negative. Gastrointestinal: no abdominal pain, change in bowel habits, or black or bloody stools Genito-Urinary: no dysuria, trouble voiding, or hematuria Hematological and Lymphatic: negative Breast: negative Musculoskeletal: negative Remaining ROS negative. Physical Exam: Blood pressure 127/70, pulse 60, temperature 97 F (36.1 C), temperature source Oral, resp. rate 20, height  5' 2.8" (1.595 m), weight 220 lb 1.6 oz (99.837 kg). ECOG: 0 General appearance: alert Head: Normocephalic, without obvious abnormality, atraumatic Neck: no adenopathy, no carotid bruit, no JVD, supple, symmetrical, trachea midline and thyroid not enlarged, symmetric, no tenderness/mass/nodules Lymph nodes: Cervical, supraclavicular, and axillary nodes normal. Heart:regular  rate and rhythm, S1, S2 normal, no murmur, click, rub or gallop Lung:chest clear, no wheezing, rales, normal symmetric air entry Abdomin: soft, non-tender, without masses or organomegaly EXT:no erythema, induration, or nodules   Lab Results: Lab Results  Component Value Date   WBC 7.4 01/07/2012   HGB 14.8 01/07/2012   HCT 43.6 01/07/2012   MCV 92.7 01/07/2012   PLT 191 01/07/2012     Chemistry      Component Value Date/Time   NA 140 06/12/2011 1407   K 3.9 06/12/2011 1407   CL 101 06/12/2011 1407   CO2 28 06/12/2011 1407   BUN 11 06/12/2011 1407   CREATININE 0.72 06/12/2011 1407      Component Value Date/Time   CALCIUM 9.5 06/12/2011 1407   ALKPHOS 66 06/12/2011 1407   AST 30 06/12/2011 1407   ALT 42* 06/12/2011 1407   BILITOT 1.0 06/12/2011 1407     CHEST - 2 VIEW  Comparison: PA and lateral chest 12/04/2010.  Findings: Lungs are clear. Heart size is normal. No pneumothorax  or pleural fluid. Thoracic degenerative change noted.  IMPRESSION:  No acute disease.   Impression and Plan:  This is a 66 year old female with the following issues: 1. Stage T2 N0, stage IB Clark's level IV with depth of invasion 1.4 mm, status post wide excision.  No evidence of any metastatic disease at this point.  Plan is to continue with active surveillance, including a physical examination, liver function tests and x-rays on an annual basis.  She will continue to have dermatological examination every 6 months. Her Chest xray from 11/2011 was reviewed with the patient today and is negative.   2. History of hypertension and hyperlipidemia, followed by primary care physician.  [facility], MD 9/25/20139:35 AM

## 2012-01-07 NOTE — Telephone Encounter (Signed)
gv pt appt schedule for march 2014 °

## 2012-01-29 IMAGING — CR DG CHEST 2V
2 series · 2 of 2 positions shown · non-contrast
Comparison: 11/05/2009

CLINICAL DATA: Cancer

CHEST - 2 VIEW

[w chest pa]
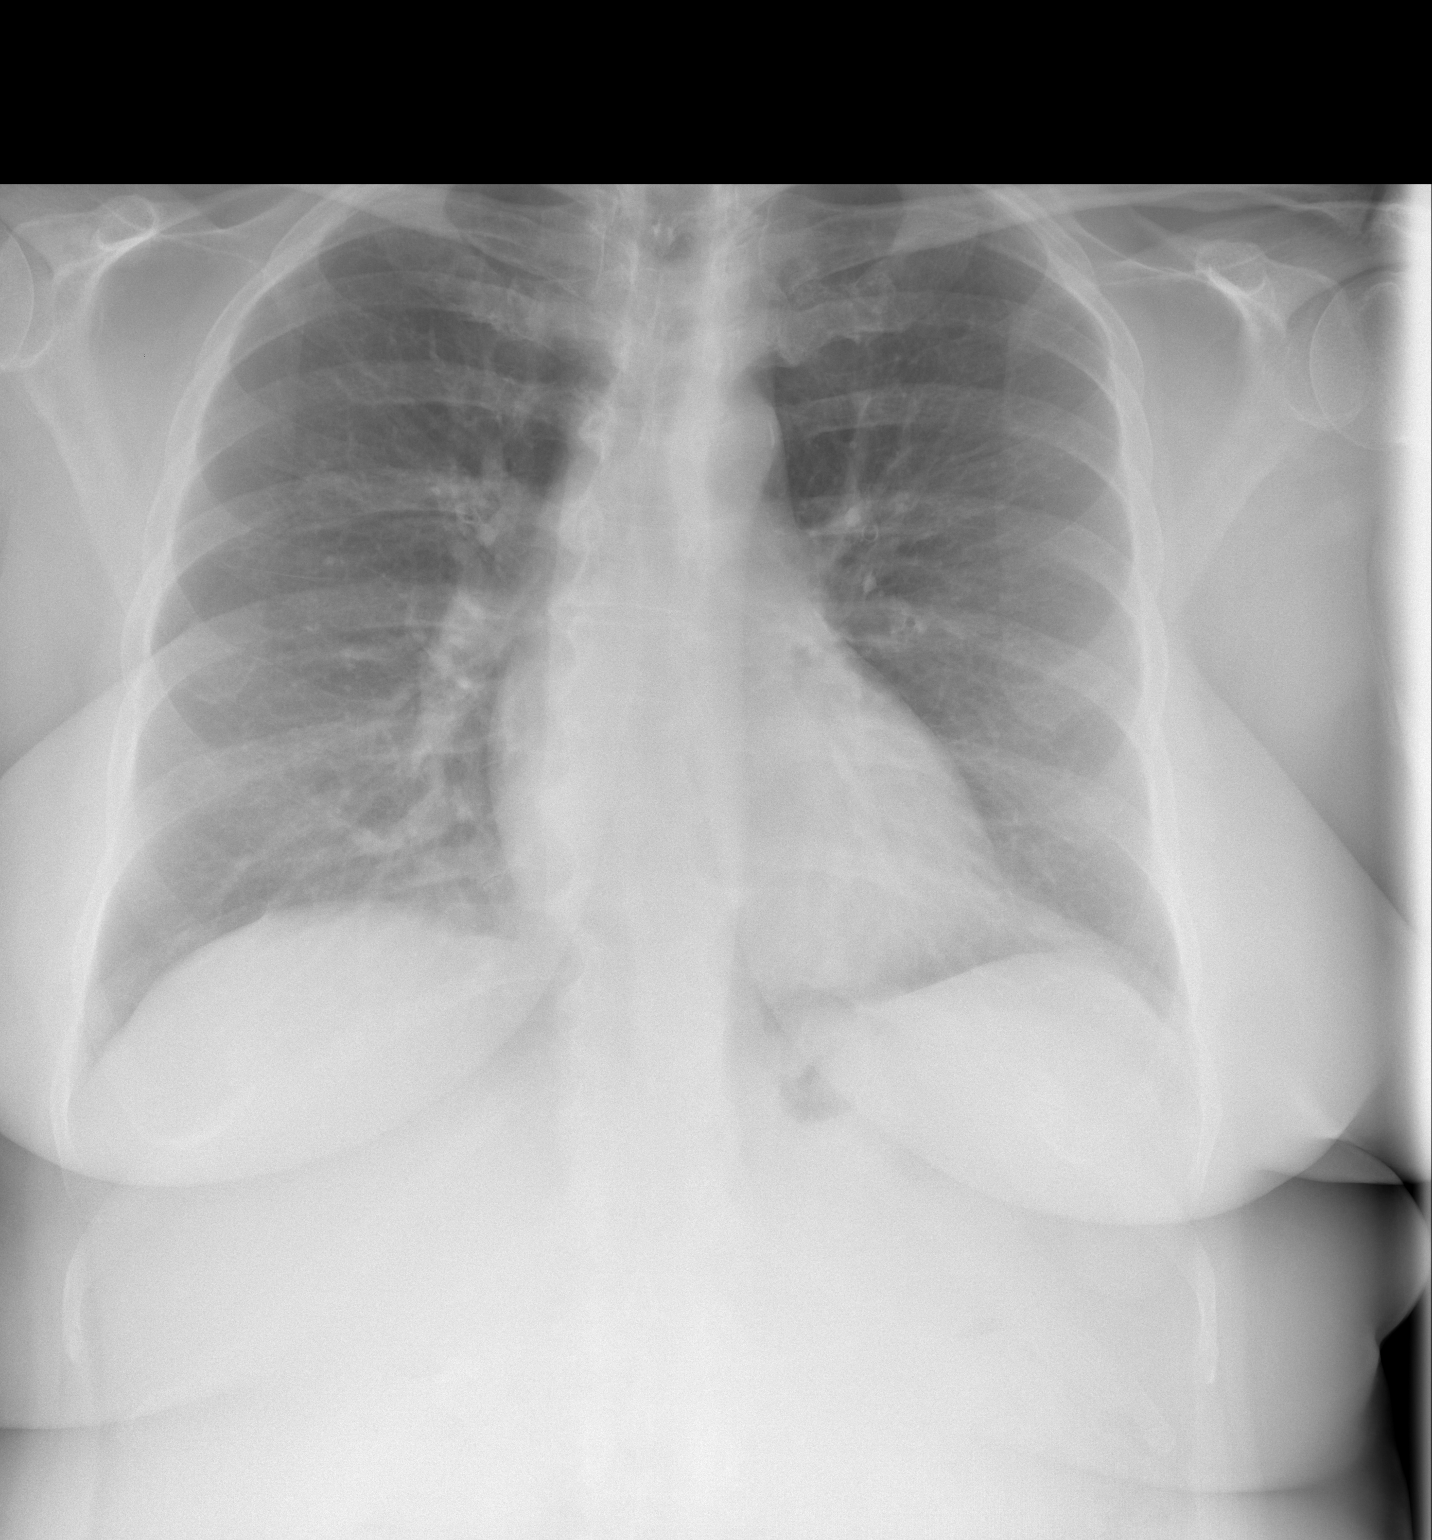

[w chest lat]
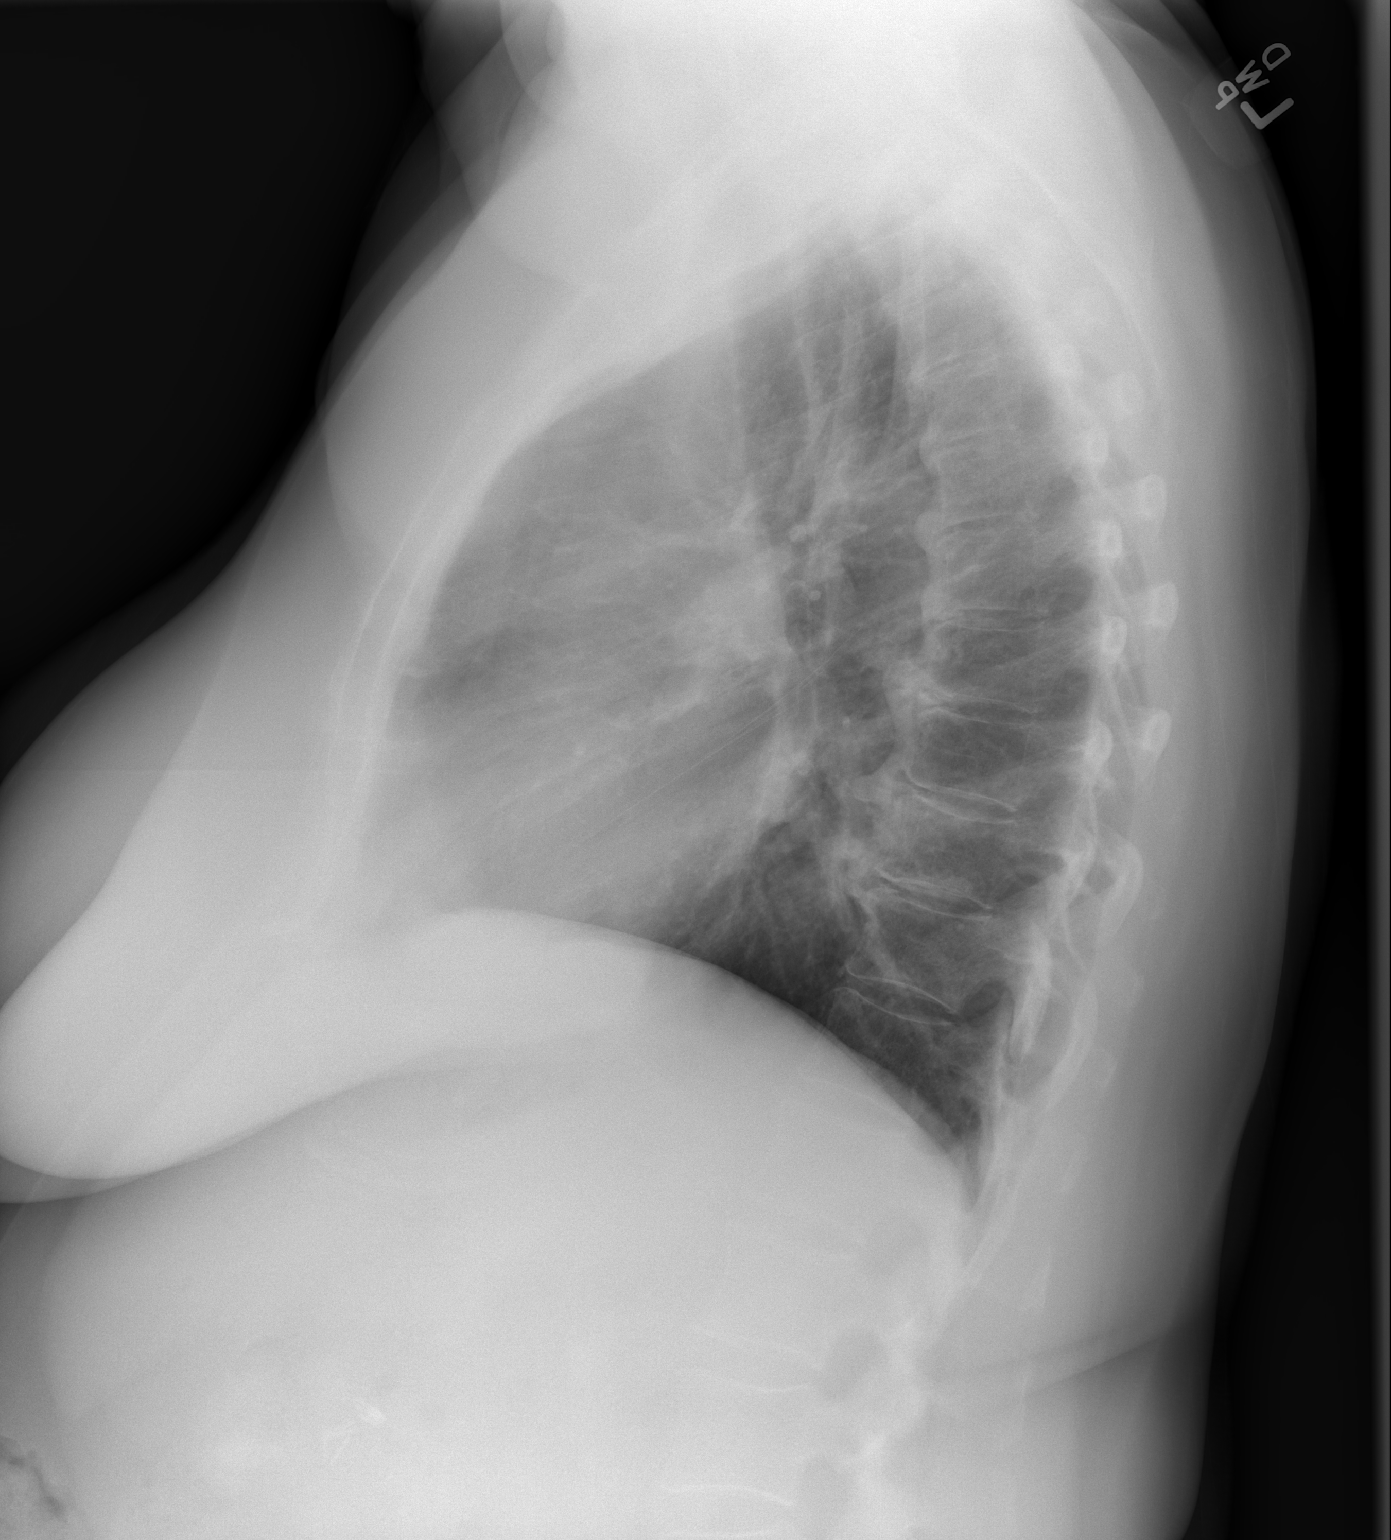

[2 of 2 positions shown; findings below may reference images not displayed]

FINDINGS: Heart size is normal.  Lungs are clear without infiltrate
or effusion.  No mass or lung nodule.
IMPRESSION: No active cardiopulmonary disease.

## 2012-06-22 ENCOUNTER — Other Ambulatory Visit (HOSPITAL_BASED_OUTPATIENT_CLINIC_OR_DEPARTMENT_OTHER): Payer: Medicare (Managed Care) | Admitting: Lab

## 2012-06-22 ENCOUNTER — Telehealth: Payer: Self-pay | Admitting: Oncology

## 2012-06-22 ENCOUNTER — Ambulatory Visit (HOSPITAL_BASED_OUTPATIENT_CLINIC_OR_DEPARTMENT_OTHER): Payer: Medicare (Managed Care) | Admitting: Oncology

## 2012-06-22 VITALS — BP 135/83 | HR 61 | Temp 97.6°F | Resp 18 | Ht 62.8 in | Wt 217.4 lb

## 2012-06-22 DIAGNOSIS — C4360 Malignant melanoma of unspecified upper limb, including shoulder: Secondary | ICD-10-CM

## 2012-06-22 DIAGNOSIS — C439 Malignant melanoma of skin, unspecified: Secondary | ICD-10-CM

## 2012-06-22 DIAGNOSIS — I10 Essential (primary) hypertension: Secondary | ICD-10-CM

## 2012-06-22 LAB — COMPREHENSIVE METABOLIC PANEL (CC13)
ALT: 45 U/L (ref 0–55)
AST: 29 U/L (ref 5–34)
Albumin: 4.1 g/dL (ref 3.5–5.0)
Alkaline Phosphatase: 72 U/L (ref 40–150)
BUN: 10.8 mg/dL (ref 7.0–26.0)
CO2: 27 mEq/L (ref 22–29)
Calcium: 9.7 mg/dL (ref 8.4–10.4)
Chloride: 103 mEq/L (ref 98–107)
Creatinine: 0.8 mg/dL (ref 0.6–1.1)
Glucose: 111 mg/dl — ABNORMAL HIGH (ref 70–99)
Potassium: 3.5 mEq/L (ref 3.5–5.1)
Sodium: 142 mEq/L (ref 136–145)
Total Bilirubin: 1.57 mg/dL — ABNORMAL HIGH (ref 0.20–1.20)
Total Protein: 7.2 g/dL (ref 6.4–8.3)

## 2012-06-22 LAB — CBC WITH DIFFERENTIAL/PLATELET
BASO%: 0.8 % (ref 0.0–2.0)
Basophils Absolute: 0 10*3/uL (ref 0.0–0.1)
EOS%: 1.6 % (ref 0.0–7.0)
Eosinophils Absolute: 0.1 10*3/uL (ref 0.0–0.5)
HCT: 44.3 % (ref 34.8–46.6)
HGB: 15.1 g/dL (ref 11.6–15.9)
LYMPH%: 32 % (ref 14.0–49.7)
MCH: 31 pg (ref 25.1–34.0)
MCHC: 34 g/dL (ref 31.5–36.0)
MCV: 91.2 fL (ref 79.5–101.0)
MONO#: 0.5 10*3/uL (ref 0.1–0.9)
MONO%: 8.8 % (ref 0.0–14.0)
NEUT#: 3.5 10*3/uL (ref 1.5–6.5)
NEUT%: 56.8 % (ref 38.4–76.8)
Platelets: 189 10*3/uL (ref 145–400)
RBC: 4.86 10*6/uL (ref 3.70–5.45)
RDW: 13.5 % (ref 11.2–14.5)
WBC: 6.2 10*3/uL (ref 3.9–10.3)
lymph#: 2 10*3/uL (ref 0.9–3.3)

## 2012-06-22 NOTE — Telephone Encounter (Signed)
GV AND PRINTED APPT SCHEDULE FOR PT FOR nov

## 2012-06-22 NOTE — Progress Notes (Signed)
Hematology and Oncology Follow Up Visit  [name] [id number] 1945-10-15 67 y.o. 06/22/2012 1:53 PM  CC: Rebecca Gosling, MD Rebecca Der, MD [name], M.D. Rebecca Poisson, MD    Principle Diagnosis: This is a 67 year old female with a diagnosis of T2 N0, stage IB superficial spreading melanoma diagnosed July 2010.  Prior Therapy:  She is Status post wide excision followed by sentinel node biposy. Her final Clark's a level was IV with 1.4-mm depth of invasion.  Current therapy: Observation, surveillance.  Interim History: [name] presents today for a followup visit.  She has continued to do very well without any evidence to suggest relapsed or recurrent disease for her melanoma.  She had not reported any symptoms of abdominal distension or pain at this point.  She had not reported any skin rashes or lesions.  Had not had any recent change in her performance status or activity level. No skin lesions noted. She is continue to follow with Dr. Nicholas Lose. She continues to have loose bowel movents that is chronic in nature.   Medications: I have reviewed the patient's current medications. Current outpatient prescriptions:amLODipine (NORVASC) 10 MG tablet, Take 10 mg by mouth daily., Disp: , Rfl: ;  aspirin 325 MG tablet, Take 325 mg by mouth daily., Disp: , Rfl: ;  atorvastatin (LIPITOR) 20 MG tablet, Take 20 mg by mouth daily., Disp: , Rfl: ;  estradiol (ESTRACE) 0.5 MG tablet, Take 0.5 mg by mouth daily., Disp: , Rfl: ;  glucosamine-chondroitin 500-400 MG tablet, Take 1 tablet by mouth 2 (two) times daily., Disp: , Rfl:  hydrochlorothiazide (HYDRODIURIL) 25 MG tablet, Take 25 mg by mouth daily., Disp: , Rfl: ;  isosorbide mononitrate (IMDUR) 120 MG 24 hr tablet, Take 120 mg by mouth daily., Disp: , Rfl: ;  lisinopril (PRINIVIL,ZESTRIL) 10 MG tablet, Take 10 mg by mouth daily., Disp: , Rfl: ;  metoprolol (LOPRESSOR) 100 MG tablet, Take 100 mg by mouth 2 (two) times daily., Disp:  , Rfl:  Multiple Vitamin (MULTIVITAMIN) tablet, Take 1 tablet by mouth daily., Disp: , Rfl: ;  NITROSTAT 0.4 MG SL tablet, Place 0.4 mg under the tongue., Disp: , Rfl: ;  potassium chloride SA (K-DUR,KLOR-CON) 20 MEQ tablet, Take 20 mEq by mouth daily. , Disp: , Rfl: ;  Vitamin D, Ergocalciferol, (DRISDOL) 50000 UNITS CAPS, Take 50,000 Units by mouth. Every other week, Disp: , Rfl:   Allergies:  Allergies  Allergen Reactions  . Azithromycin   . Morphine And Related   . Other     Horse products  . Penicillins   . Tetanus Toxoids     Past Medical History, Surgical history, Social history, and Family History were reviewed and updated.  Review of Systems: Constitutional:  Negative for fever, chills, night sweats, anorexia, weight loss, pain. Cardiovascular: no chest pain or dyspnea on exertion Respiratory: no cough, shortness of breath, or wheezing Neurological: no TIA or stroke symptoms Dermatological: negative ENT: negative Skin: Negative. Gastrointestinal: no abdominal pain, change in bowel habits, or black or bloody stools Genito-Urinary: no dysuria, trouble voiding, or hematuria Hematological and Lymphatic: negative Breast: negative Musculoskeletal: negative Remaining ROS negative. Physical Exam: Blood pressure 135/83, pulse 61, temperature 97.6 F (36.4 C), temperature source Oral, resp. rate 18, height 5' 2.8" (1.595 m), weight 217 lb 6.4 oz (98.612 kg). ECOG: 0 General appearance: alert Head: Normocephalic, without obvious abnormality, atraumatic Neck: no adenopathy, no carotid bruit, no JVD, supple, symmetrical, trachea midline and thyroid not enlarged,  symmetric, no tenderness/mass/nodules Lymph nodes: Cervical, supraclavicular, and axillary nodes normal. Heart:regular rate and rhythm, S1, S2 normal, no murmur, click, rub or gallop Lung:chest clear, no wheezing, rales, normal symmetric air entry Abdomin: soft, non-tender, without masses or organomegaly EXT:no erythema,  induration, or nodules Skin: no lesions noted on exam today.   Lab Results: Lab Results  Component Value Date   WBC 6.2 06/22/2012   HGB 15.1 06/22/2012   HCT 44.3 06/22/2012   MCV 91.2 06/22/2012   PLT 189 06/22/2012     Chemistry      Component Value Date/Time   NA 142 06/22/2012 1308   NA 140 06/12/2011 1407   K 3.5 06/22/2012 1308   K 3.9 06/12/2011 1407   CL 103 06/22/2012 1308   CL 101 06/12/2011 1407   CO2 27 06/22/2012 1308   CO2 28 06/12/2011 1407   BUN 10.8 06/22/2012 1308   BUN 11 06/12/2011 1407   CREATININE 0.8 06/22/2012 1308   CREATININE 0.72 06/12/2011 1407      Component Value Date/Time   CALCIUM 9.7 06/22/2012 1308   CALCIUM 9.5 06/12/2011 1407   ALKPHOS 72 06/22/2012 1308   ALKPHOS 66 06/12/2011 1407   AST 29 06/22/2012 1308   AST 30 06/12/2011 1407   ALT 45 06/22/2012 1308   ALT 42* 06/12/2011 1407   BILITOT 1.57* 06/22/2012 1308   BILITOT 1.0 06/12/2011 1407      Impression and Plan:  This is a 67 year old female with the following issues: 1. Stage T2 N0, stage IB Clark's level IV with depth of invasion 1.4 mm, status post wide excision.  No evidence of any metastatic disease at this point.  Plan is to continue with active surveillance, including a physical examination, liver function tests and x-rays on an annual basis.  She will continue to have dermatological examination every 6 months. Her last Chest xray done in 11/2011.  2. History of hypertension and hyperlipidemia, followed by primary care physician.  Eli Hose, MD 3/11/20141:53 PM

## 2012-09-16 ENCOUNTER — Encounter: Payer: Self-pay | Admitting: Obstetrics and Gynecology

## 2012-09-17 ENCOUNTER — Ambulatory Visit (INDEPENDENT_AMBULATORY_CARE_PROVIDER_SITE_OTHER): Payer: Medicare Other | Admitting: Obstetrics and Gynecology

## 2012-09-17 ENCOUNTER — Encounter: Payer: Self-pay | Admitting: Obstetrics and Gynecology

## 2012-09-17 VITALS — BP 122/60 | Ht 62.0 in | Wt 218.0 lb

## 2012-09-17 DIAGNOSIS — Z01419 Encounter for gynecological examination (general) (routine) without abnormal findings: Secondary | ICD-10-CM

## 2012-09-17 MED ORDER — ESTRADIOL 0.1 MG/GM VA CREA: TOPICAL_CREAM | VAGINAL | Status: DC

## 2012-09-17 MED ORDER — VITAMIN D (ERGOCALCIFEROL) 1.25 MG (50000 UNIT) PO CAPS: 50000.0000 [IU] | ORAL_CAPSULE | ORAL | Status: DC

## 2012-09-17 NOTE — Progress Notes (Signed)
67 y.o.   Married    Caucasian   female   G2P2   here for annual exam.  Sees Dr. Lavell Luster q July.  Having to take more nitroglycerin recently.  No vag bleeding.  Uses prem cream 2 x per week.  Is sexually active and that helps.  Has night sweats and hot flashes as it is, even on ERT.    No LMP recorded. Patient is postmenopausal.          Sexually active: yes  The current method of family planning is status post hysterectomy and post menopausal status.    Exercising: walk 1 mile a day, 5 days a week Last mammogram: 08/2012 normal  Last pap smear:11/23/00 History of abnormal pap: no Smoking:never Alcohol:occ glass of wine Last colonoscopy:2007 normal, repeat in 10 years Last Bone Density:  08/2011 stable Last tetanus shot: allergic reaction Last cholesterol check: 01/2012 normal  Hgb:pcp                Urine:pcp   Family History  Problem Relation Age of Onset  . Diabetes Mother   . Stroke Mother   . Hypertension Mother   . Heart disease Father   . Hypertension Father   . Cancer Father     pancreatic cancer    There are no active problems to display for this patient.   Past Medical History  Diagnosis Date  . Fibrocystic breast changes   . Menorrhagia   . Glucosuria   . GERD (gastroesophageal reflux disease)   . Bone spur   . Melanoma 10/2008    right shoulder and arm  . Hernia, inguinal, left 10/2002    Past Surgical History  Procedure Laterality Date  . Hysteroscopy  2/98    D&C (polyps)  . Total abdominal hysterectomy      LSO     Failed TVH  . Coronary angioplasty with stent placement  10/99  . Angioplasty  2000  . Node dissection      neg  . Appendectomy      Allergies: Azithromycin; Morphine and related; Other; Penicillins; Tetanus toxoids; and Augmentin  Current Outpatient Prescriptions  Medication Sig Dispense Refill  . amLODipine (NORVASC) 10 MG tablet Take 10 mg by mouth daily.      Marland Kitchen aspirin 325 MG tablet Take 325 mg by mouth daily.      Marland Kitchen  atorvastatin (LIPITOR) 20 MG tablet Take 20 mg by mouth daily.      Marland Kitchen estradiol (ESTRACE) 0.5 MG tablet Take 0.5 mg by mouth daily.      Marland Kitchen glucosamine-chondroitin 500-400 MG tablet Take 1 tablet by mouth 2 (two) times daily.      . hydrochlorothiazide (HYDRODIURIL) 25 MG tablet Take 25 mg by mouth daily.      . isosorbide mononitrate (IMDUR) 120 MG 24 hr tablet Take 120 mg by mouth daily.      Marland Kitchen lisinopril (PRINIVIL,ZESTRIL) 10 MG tablet Take 10 mg by mouth daily.      . metoprolol (LOPRESSOR) 100 MG tablet Take 100 mg by mouth 2 (two) times daily.      . Multiple Vitamin (MULTIVITAMIN) tablet Take 1 tablet by mouth daily.      Marland Kitchen NITROSTAT 0.4 MG SL tablet Place 0.4 mg under the tongue as needed.       . potassium chloride SA (K-DUR,KLOR-CON) 20 MEQ tablet Take 20 mEq by mouth daily.       . Vitamin D, Ergocalciferol, (DRISDOL) 50000 UNITS CAPS Take  50,000 Units by mouth. Every other week       No current facility-administered medications for this visit.    ROS: Pertinent items are noted in HPI.  Social Hx:  Married, two children  Exam:    BP 122/60  Ht 5\' 2"  (1.575 m)  Wt 218 lb (98.884 kg)  BMI 39.86 kg/m2  Ht stable, Wt up two pounds.   Wt Readings from Last 3 Encounters:  09/17/12 218 lb (98.884 kg)  06/22/12 217 lb 6.4 oz (98.612 kg)  01/07/12 220 lb 1.6 oz (99.837 kg)     Ht Readings from Last 3 Encounters:  09/17/12 5\' 2"  (1.575 m)  06/22/12 5' 2.8" (1.595 m)  01/07/12 5' 2.8" (1.595 m)    General appearance: alert, cooperative and appears stated age Head: Normocephalic, without obvious abnormality, atraumatic Neck: no adenopathy, supple, symmetrical, trachea midline and thyroid not enlarged, symmetric, no tenderness/mass/nodules Lungs: clear to auscultation bilaterally Breasts: Inspection negative, No nipple retraction or dimpling, No nipple discharge or bleeding, No axillary or supraclavicular adenopathy, Normal to palpation without dominant masses Heart: regular  rate and rhythm Abdomen: soft, non-tender; bowel sounds normal; no masses,  no organomegaly Extremities: extremities normal, atraumatic, no cyanosis or edema Skin: Skin color, texture, turgor normal. No rashes or lesions Lymph nodes: Cervical, supraclavicular, and axillary nodes normal. No abnormal inguinal nodes palpated Neurologic: Grossly normal   Pelvic: External genitalia:  no lesions              Urethra:  normal appearing urethra with no masses, tenderness or lesions              Bartholins and Skenes: normal                 Vagina: normal appearing vagina with normal color and discharge, no lesions,  Gr 1 rectocele, very minimal cystocele              Cervix: absent              Pap taken: no        Bimanual Exam:  Uterus:  absent                                      Adnexa: normal adnexa in size, nontender and no masses                                      Rectovaginal: Confirms                                      Anus:  normal sphincter tone, no lesions  A: normal menopausal exam, on ERT     S/P TAH/LSO     Gr 1 rectocele with min cystocele     P: mammogram counseled on breast self exam, mammography screening, adequate intake of calcium and vitamin D, diet and exercise return annually or prn     An After Visit Summary was printed and given to the patient.

## 2012-09-17 NOTE — Patient Instructions (Addendum)

## 2012-11-17 ENCOUNTER — Other Ambulatory Visit: Payer: Self-pay

## 2012-11-18 ENCOUNTER — Other Ambulatory Visit: Payer: Self-pay | Admitting: *Deleted

## 2012-11-18 NOTE — Telephone Encounter (Signed)
Fax From: [facility] for Estradiol 0.5 mg  Last Refilled: 07/05/12 Aex Scheduled: 09/19/13  Please advise patient was seen for AEX 09/17/12 no rx was given.

## 2012-11-18 NOTE — Telephone Encounter (Signed)
Chart In Your Door 

## 2012-11-19 ENCOUNTER — Other Ambulatory Visit: Payer: Self-pay

## 2012-11-19 MED ORDER — ESTRADIOL 0.5 MG PO TABS: 0.5000 mg | ORAL_TABLET | Freq: Every day | ORAL | Status: DC

## 2012-12-30 ENCOUNTER — Telehealth: Payer: Self-pay | Admitting: Oncology

## 2012-12-30 NOTE — Telephone Encounter (Signed)
Pt called and call returned pt r/s appt to 10/30 for lab and 02/11/13 MD

## 2013-01-11 ENCOUNTER — Telehealth: Payer: Self-pay | Admitting: Oncology

## 2013-01-11 NOTE — Telephone Encounter (Signed)
Due to [facility] 10/31 f/u moved to 11/7 @ 4pm per FS. S/w pt re change she is aware. Also confirmed 10/30 lb appt.

## 2013-01-27 ENCOUNTER — Telehealth: Payer: Self-pay | Admitting: *Deleted

## 2013-01-27 MED ORDER — ATORVASTATIN CALCIUM 20 MG PO TABS: 20.0000 mg | ORAL_TABLET | Freq: Every day | ORAL | Status: DC

## 2013-01-27 NOTE — Telephone Encounter (Signed)
Refilled

## 2013-02-10 ENCOUNTER — Ambulatory Visit (HOSPITAL_COMMUNITY)
Admission: RE | Admit: 2013-02-10 | Discharge: 2013-02-10 | Disposition: A | Discharge status: Home / Self Care | Payer: Medicare Other | Source: Ambulatory Visit | Attending: Oncology | Admitting: Oncology

## 2013-02-10 ENCOUNTER — Other Ambulatory Visit (HOSPITAL_BASED_OUTPATIENT_CLINIC_OR_DEPARTMENT_OTHER): Payer: Medicare (Managed Care) | Admitting: Lab

## 2013-02-10 DIAGNOSIS — C439 Malignant melanoma of skin, unspecified: Secondary | ICD-10-CM | POA: Insufficient documentation

## 2013-02-10 DIAGNOSIS — C4360 Malignant melanoma of unspecified upper limb, including shoulder: Secondary | ICD-10-CM

## 2013-02-10 LAB — CBC WITH DIFFERENTIAL/PLATELET
BASO%: 0.8 % (ref 0.0–2.0)
Basophils Absolute: 0 10*3/uL (ref 0.0–0.1)
EOS%: 2.6 % (ref 0.0–7.0)
Eosinophils Absolute: 0.2 10*3/uL (ref 0.0–0.5)
HCT: 44.1 % (ref 34.8–46.6)
HGB: 14.9 g/dL (ref 11.6–15.9)
LYMPH%: 32.3 % (ref 14.0–49.7)
MCH: 31.3 pg (ref 25.1–34.0)
MCHC: 33.7 g/dL (ref 31.5–36.0)
MCV: 92.8 fL (ref 79.5–101.0)
MONO#: 0.4 10*3/uL (ref 0.1–0.9)
MONO%: 6.9 % (ref 0.0–14.0)
NEUT#: 3.4 10*3/uL (ref 1.5–6.5)
NEUT%: 57.4 % (ref 38.4–76.8)
Platelets: 216 10*3/uL (ref 145–400)
RBC: 4.75 10*6/uL (ref 3.70–5.45)
RDW: 13.4 % (ref 11.2–14.5)
WBC: 5.9 10*3/uL (ref 3.9–10.3)
lymph#: 1.9 10*3/uL (ref 0.9–3.3)

## 2013-02-10 LAB — COMPREHENSIVE METABOLIC PANEL (CC13)
ALT: 40 U/L (ref 0–55)
AST: 28 U/L (ref 5–34)
Albumin: 3.6 g/dL (ref 3.5–5.0)
Alkaline Phosphatase: 68 U/L (ref 40–150)
Anion Gap: 11 mEq/L (ref 3–11)
BUN: 11.1 mg/dL (ref 7.0–26.0)
CO2: 25 mEq/L (ref 22–29)
Calcium: 9.5 mg/dL (ref 8.4–10.4)
Chloride: 105 mEq/L (ref 98–109)
Creatinine: 0.8 mg/dL (ref 0.6–1.1)
Glucose: 145 mg/dl — ABNORMAL HIGH (ref 70–140)
Potassium: 3.3 mEq/L — ABNORMAL LOW (ref 3.5–5.1)
Sodium: 141 mEq/L (ref 136–145)
Total Bilirubin: 1.02 mg/dL (ref 0.20–1.20)
Total Protein: 7.1 g/dL (ref 6.4–8.3)

## 2013-02-10 LAB — LACTATE DEHYDROGENASE (CC13): LDH: 197 U/L (ref 125–245)

## 2013-02-11 ENCOUNTER — Ambulatory Visit: Payer: Medicare Other | Admitting: Oncology

## 2013-02-17 ENCOUNTER — Other Ambulatory Visit: Payer: Self-pay

## 2013-02-18 ENCOUNTER — Ambulatory Visit (HOSPITAL_BASED_OUTPATIENT_CLINIC_OR_DEPARTMENT_OTHER): Payer: Medicare (Managed Care) | Admitting: Oncology

## 2013-02-18 VITALS — BP 143/84 | HR 66 | Temp 97.1°F | Resp 18 | Ht 62.0 in | Wt 222.5 lb

## 2013-02-18 DIAGNOSIS — C4360 Malignant melanoma of unspecified upper limb, including shoulder: Secondary | ICD-10-CM

## 2013-02-18 DIAGNOSIS — C439 Malignant melanoma of skin, unspecified: Secondary | ICD-10-CM

## 2013-02-18 NOTE — Progress Notes (Signed)
Hematology and Oncology Follow Up Visit  [name] [id number] 06-13-1945 67 y.o. 02/18/2013 3:52 PM  CC: Juanetta Gosling, MD Dossie Der, MD Francisca December, M.D. Venancio Poisson, MD    Principle Diagnosis: This is a 67 year old female with a diagnosis of T2 N0, stage IB superficial spreading melanoma diagnosed July 2010.  Prior Therapy:  She is Status post wide excision followed by sentinel node biposy. Her final Clark's a level was IV with 1.4-mm depth of invasion.  Current therapy: Observation, surveillance.  Interim History: [name] presents today for a followup visit. She has continued to do very well without any evidence to suggest relapsed or recurrent disease for her melanoma.  She had not reported any symptoms of abdominal distension or pain at this point.  She had not reported any skin rashes or lesions.  Had not had any recent change in her performance status or activity level. No skin lesions noted. She is continue to follow with Dr. Nicholas Lose. No issues or hospitalizations.   Medications: I have reviewed the patient's current medications.  Current Outpatient Prescriptions  Medication Sig Dispense Refill  . amLODipine (NORVASC) 10 MG tablet Take 10 mg by mouth daily.      Marland Kitchen aspirin 325 MG tablet Take 325 mg by mouth daily.      Marland Kitchen atorvastatin (LIPITOR) 20 MG tablet Take 1 tablet (20 mg total) by mouth daily.  90 tablet  3  . estradiol (ESTRACE) 0.1 MG/GM vaginal cream 1/2 g pv qhs x 2 wks, then 1/2 g pv 2-3 times q wk  42.5 g  3  . estradiol (ESTRACE) 0.5 MG tablet Take 1 tablet (0.5 mg total) by mouth daily.  90 tablet  3  . glucosamine-chondroitin 500-400 MG tablet Take 1 tablet by mouth 2 (two) times daily.      . hydrochlorothiazide (HYDRODIURIL) 25 MG tablet Take 25 mg by mouth daily.      . isosorbide mononitrate (IMDUR) 120 MG 24 hr tablet Take 120 mg by mouth daily.      Marland Kitchen lisinopril (PRINIVIL,ZESTRIL) 10 MG tablet Take 10 mg by mouth daily.      .  metoprolol (LOPRESSOR) 100 MG tablet Take 100 mg by mouth 2 (two) times daily.      . Multiple Vitamin (MULTIVITAMIN) tablet Take 1 tablet by mouth daily.      Marland Kitchen NITROSTAT 0.4 MG SL tablet Place 0.4 mg under the tongue as needed.       . potassium chloride SA (K-DUR,KLOR-CON) 20 MEQ tablet Take 20 mEq by mouth daily.       . Vitamin D, Ergocalciferol, (DRISDOL) 50000 UNITS CAPS Take 1 capsule (50,000 Units total) by mouth every 14 (fourteen) days. Every other week  26 capsule  1   No current facility-administered medications for this visit.    Allergies:  Allergies  Allergen Reactions  . Azithromycin   . Morphine And Related   . Other     Horse products  . Penicillins   . Tetanus Toxoids   . Augmentin [Amoxicillin-Pot Clavulanate] Rash    Past Medical History, Surgical history, Social history, and Family History were reviewed and updated.  Review of Systems:  Remaining ROS negative. Physical Exam: Blood pressure 143/84, pulse 66, temperature 97.1 F (36.2 C), temperature source Oral, resp. rate 18, height 5\' 2"  (1.575 m), weight 222 lb 8 oz (100.925 kg), SpO2 97.00%. ECOG: 0 General appearance: alert Head: Normocephalic, without obvious abnormality, atraumatic Neck: no adenopathy,  no carotid bruit, no JVD, supple, symmetrical, trachea midline and thyroid not enlarged, symmetric, no tenderness/mass/nodules Lymph nodes: Cervical, supraclavicular, and axillary nodes normal. Heart:regular rate and rhythm, S1, S2 normal, no murmur, click, rub or gallop Lung:chest clear, no wheezing, rales, normal symmetric air entry Abdomin: soft, non-tender, without masses or organomegaly EXT:no erythema, induration, or nodules Skin: no lesions noted on exam today.   Lab Results: Lab Results  Component Value Date   WBC 5.9 02/10/2013   HGB 14.9 02/10/2013   HCT 44.1 02/10/2013   MCV 92.8 02/10/2013   PLT 216 02/10/2013     Chemistry      Component Value Date/Time   NA 141 02/10/2013  1409   NA 140 06/12/2011 1407   K 3.3* 02/10/2013 1409   K 3.9 06/12/2011 1407   CL 103 06/22/2012 1308   CL 101 06/12/2011 1407   CO2 25 02/10/2013 1409   CO2 28 06/12/2011 1407   BUN 11.1 02/10/2013 1409   BUN 11 06/12/2011 1407   CREATININE 0.8 02/10/2013 1409   CREATININE 0.72 06/12/2011 1407      Component Value Date/Time   CALCIUM 9.5 02/10/2013 1409   CALCIUM 9.5 06/12/2011 1407   ALKPHOS 68 02/10/2013 1409   ALKPHOS 66 06/12/2011 1407   AST 28 02/10/2013 1409   AST 30 06/12/2011 1407   ALT 40 02/10/2013 1409   ALT 42* 06/12/2011 1407   BILITOT 1.02 02/10/2013 1409   BILITOT 1.0 06/12/2011 1407     EXAM:  CHEST 2 VIEW  COMPARISON: 11/27/2011  FINDINGS:  Cardiac shadow is stable. The lungs are clear bilaterally.  Degenerative changes of the thoracic spine are again seen.  IMPRESSION:  No acute abnormality is noted.  Impression and Plan:  This is a 67 year old female with the following issues: 1. Stage T2 N0, stage IB Clark's level IV with depth of invasion 1.4 mm, status post wide excision.  No evidence of any metastatic disease at this point based on laboratory data and chest x-ray that were reviewed today.  Plan is to continue with active surveillance, including a physical examination, liver function tests and x-rays on an annual basis.  She will continue to have dermatological examination every 6 months.   2. History of hypertension and hyperlipidemia, followed by primary care physician.  Eli Hose, MD 11/7/20143:52 PM

## 2013-02-22 ENCOUNTER — Other Ambulatory Visit (HOSPITAL_COMMUNITY): Payer: Medicare Other

## 2013-02-22 ENCOUNTER — Other Ambulatory Visit: Payer: 59

## 2013-02-22 ENCOUNTER — Telehealth: Payer: Self-pay | Admitting: Oncology

## 2013-02-22 NOTE — Telephone Encounter (Signed)
S/w pt re appts for lb/cxr 02/14/14 @ 1pm - cxr walk in appt and 11/5 FS @ 3pm.

## 2013-02-23 ENCOUNTER — Ambulatory Visit: Payer: 59 | Admitting: Oncology

## 2013-09-19 ENCOUNTER — Encounter: Payer: Self-pay | Admitting: Nurse Practitioner

## 2013-09-19 ENCOUNTER — Ambulatory Visit (INDEPENDENT_AMBULATORY_CARE_PROVIDER_SITE_OTHER): Payer: Medicare Other | Admitting: Nurse Practitioner

## 2013-09-19 VITALS — BP 120/74 | HR 56 | Ht 63.5 in | Wt 220.0 lb

## 2013-09-19 DIAGNOSIS — Z1211 Encounter for screening for malignant neoplasm of colon: Secondary | ICD-10-CM

## 2013-09-19 DIAGNOSIS — Z01419 Encounter for gynecological examination (general) (routine) without abnormal findings: Secondary | ICD-10-CM

## 2013-09-19 DIAGNOSIS — Z0000 Encounter for general adult medical examination without abnormal findings: Secondary | ICD-10-CM

## 2013-09-19 LAB — POCT URINALYSIS DIPSTICK
Bilirubin, UA: NEGATIVE
Blood, UA: NEGATIVE
Glucose, UA: NEGATIVE
Ketones, UA: NEGATIVE
Leukocytes, UA: NEGATIVE
Nitrite, UA: NEGATIVE
Protein, UA: NEGATIVE
Urobilinogen, UA: NEGATIVE
pH, UA: 6

## 2013-09-19 MED ORDER — ESTRADIOL 0.1 MG/GM VA CREA: TOPICAL_CREAM | VAGINAL | Status: DC

## 2013-09-19 MED ORDER — ESTRADIOL 0.5 MG PO TABS: 0.5000 mg | ORAL_TABLET | Freq: Every day | ORAL | Status: DC

## 2013-09-19 NOTE — Progress Notes (Signed)
Patient ID: [name], female   DOB: 1945-10-11, 68 y.o.   MRN: [id number] 69 y.o. G2P2 Married Caucasian Fe here for annual exam.  She is followed by Dr. Ubaldo Glassing and cancer center.  Son committed suicide while living in [place] last November.  She is in counseling and is doing some better.   She wants to continue ERT.  Vaginal atrophy is better with vaginal estrogen.  No LMP recorded. Patient is postmenopausal.          Sexually active: yes  The current method of family planning is post menopausal status and hysterectomy   Exercising: yes  walking Smoker:  no  Health Maintenance: Pap:  11/23/00, WNL, hyst MMG:  08/23/12, Bi-Rads 2:  negative  Colonoscopy:  2007, normal, repeat in 10 years BMD:  08/2011 stable TDaP:  None-allergy to vaccine Labs:  [place]  Urine:  neg   reports that she has never smoked. She has never used smokeless tobacco. She reports that she drinks about .5 ounces of alcohol per week. She reports that she does not use illicit drugs.  Past Medical History  Diagnosis Date  . Fibrocystic breast changes   . Menorrhagia   . Glucosuria   . GERD (gastroesophageal reflux disease)   . Bone spur   . Melanoma 10/2008    right shoulder and arm  . Hernia, inguinal, left 10/2002    Past Surgical History  Procedure Laterality Date  . Hysteroscopy  2/98    D&C (polyps)  . Total abdominal hysterectomy      LSO     Failed TVH  . Coronary angioplasty with stent placement  10/99  . Angioplasty  2000  . Node dissection      neg  . Appendectomy      Current Outpatient Prescriptions  Medication Sig Dispense Refill  . ALPRAZolam (XANAX) 0.25 MG tablet Take 1 tablet by mouth at bedtime.      Marland Kitchen amLODipine (NORVASC) 10 MG tablet Take 10 mg by mouth daily.      Marland Kitchen aspirin 325 MG tablet Take 325 mg by mouth daily.      Marland Kitchen atorvastatin (LIPITOR) 10 MG tablet Take 10 mg by mouth daily.      . Eflornithine HCl (VANIQA) 13.9 % cream Apply topically 2 (two) times daily with a  meal.      . estradiol (ESTRACE) 0.1 MG/GM vaginal cream 1/2 g pv qhs x 2 wks, then 1/2 g pv 2-3 times q wk  42.5 g  3  . estradiol (ESTRACE) 0.5 MG tablet Take 1 tablet (0.5 mg total) by mouth daily.  90 tablet  0  . glucosamine-chondroitin 500-400 MG tablet Take 1 tablet by mouth 2 (two) times daily.      . hydrochlorothiazide (HYDRODIURIL) 25 MG tablet Take 25 mg by mouth daily.      . isosorbide mononitrate (IMDUR) 120 MG 24 hr tablet Take 120 mg by mouth daily.      Marland Kitchen lisinopril (PRINIVIL,ZESTRIL) 10 MG tablet Take 10 mg by mouth daily.      . metoprolol (LOPRESSOR) 100 MG tablet Take 100 mg by mouth 2 (two) times daily.      . Multiple Vitamin (MULTIVITAMIN) tablet Take 1 tablet by mouth daily.      Marland Kitchen NITROSTAT 0.4 MG SL tablet Place 0.4 mg under the tongue as needed.       . potassium chloride SA (K-DUR,KLOR-CON) 20 MEQ tablet Take 20 mEq by mouth daily.       Marland Kitchen  sertraline (ZOLOFT) 50 MG tablet Take 1 tablet by mouth daily.      . Vitamin D, Ergocalciferol, (DRISDOL) 50000 UNITS CAPS capsule Take 50,000 Units by mouth every 14 (fourteen) days.       No current facility-administered medications for this visit.    Family History  Problem Relation Age of Onset  . Diabetes Mother   . Stroke Mother   . Hypertension Mother   . Heart disease Father   . Hypertension Father   . Cancer Father     pancreatic cancer    ROS:  Pertinent items are noted in HPI.  Otherwise, a comprehensive ROS was negative.  Exam:   BP 120/74  Pulse 56  Ht 5' 3.5" (1.613 m)  Wt 220 lb (99.791 kg)  BMI 38.36 kg/m2 Height: 5' 3.5" (161.3 cm)  Ht Readings from Last 3 Encounters:  09/19/13 5' 3.5" (1.613 m)  02/18/13 5\' 2"  (1.575 m)  09/17/12 5\' 2"  (1.575 m)    General appearance: alert, cooperative and appears stated age Head: Normocephalic, without obvious abnormality, atraumatic Neck: no adenopathy, supple, symmetrical, trachea midline and thyroid normal to inspection and palpation Lungs: clear to  auscultation bilaterally Breasts: normal appearance, no masses or tenderness Heart: regular rate and rhythm Abdomen: soft, non-tender; no masses,  no organomegaly Extremities: extremities normal, atraumatic, no cyanosis or edema Skin: Skin color, texture, turgor normal. No rashes or lesions Lymph nodes: Cervical, supraclavicular, and axillary nodes normal. No abnormal inguinal nodes palpated Neurologic: Grossly normal   Pelvic: External genitalia:  no lesions              Urethra:  normal appearing urethra with no masses, tenderness or lesions              Bartholin's and Skene's: normal                 Vagina: normal appearing vagina with normal color and discharge, no lesions              Cervix: absent              Pap taken: no Bimanual Exam:  Uterus:  uterus absent              Adnexa: no mass, fullness, tenderness               Rectovaginal: Confirms               Anus:  normal sphincter tone, no lesions  A:  Well Woman with normal exam  S/P TAH/ LSO 1999  Postmenopausal on ERT  Grief reaction to sons death March 03, 2023  S/P melanoma right shoulder found here at [facility]  P:   Reviewed health and wellness pertinent to exam  Pap smear not taken today  Mammogram is due and is scheduled  IFOB is given  She is aware that ERT is given only for 1 Reill until Mammo is completed.  Refill on Estradiol 0.5 mg.  Also discussed that it is time to stop and taper off ERT.  She will try this summer but if not successful will do in the fall.  Also refill of vaginal estrogen cream.  Counseled with risk of DVT, CVA, cancer, etc.  Counseled on breast self exam, mammography screening, adequate intake of calcium and vitamin D, diet and exercise return annually or prn  An After Visit Summary was printed and given to the patient.

## 2013-09-19 NOTE — Patient Instructions (Signed)

## 2013-09-21 ENCOUNTER — Telehealth: Payer: Self-pay | Admitting: Nurse Practitioner

## 2013-09-21 NOTE — Telephone Encounter (Signed)
Order to [place] Cage, [place] desk for review and signature.

## 2013-09-21 NOTE — Telephone Encounter (Signed)
Pt is calling to get an order for a 3D Screening mammogram. She checked with her insurance and was told if we write it for " preventative screening" and not a diagnostic screening it will be covered by her insurance. Pt would like to have this done at [facility].

## 2013-09-25 NOTE — Progress Notes (Signed)
Encounter reviewed by Dr. Josefa Half.  I agree with recommendation to discontinue estrogen.

## 2013-09-27 ENCOUNTER — Telehealth: Payer: Self-pay | Admitting: Nurse Practitioner

## 2013-09-27 NOTE — Telephone Encounter (Signed)
Pt has gotten mammogram done and calling to get refill for Estradiol 90 day supply to [facility] at [phone].

## 2013-09-27 NOTE — Telephone Encounter (Signed)
Spoke with patient. Patient had mammogram done last Thursday at [place]. Advised patient that results have not yet been scanned into our system. Advised I would send a message to covering provider and Rebecca Cage, FNP is out of the office. Our office may have received results and they may not have gone to scan. Advised will need results before refill. Patient agreeable and verbalizes understanding. Advised would give patient a call back with refill once results are received and reviewed. Patient agreeable and requests voicemail be left at [phone].

## 2013-09-28 ENCOUNTER — Other Ambulatory Visit: Payer: Self-pay | Admitting: Obstetrics and Gynecology

## 2013-09-28 MED ORDER — ESTRADIOL 0.5 MG PO TABS: 0.5000 mg | ORAL_TABLET | Freq: Every day | ORAL | Status: DC

## 2013-09-28 NOTE — Telephone Encounter (Signed)
Per our conversation, you will contact [facility] about the patient's incomplete  Mammogram from 09/22/13. We will need to await the final results from any diagnostic studies in order to safely prescribe her hormonal therapy.

## 2013-09-28 NOTE — Addendum Note (Signed)
Addended by: Graylon Good on: 09/28/2013 08:56 AM   Modules accepted: Orders

## 2013-09-28 NOTE — Telephone Encounter (Signed)
Addendum to abnormal screening mammogram  Additional imaging was obtained.  Images compared t0 09/22/13 and 08/23/12 - no evidence of malignancy.   Next mammogram in one year.

## 2013-09-28 NOTE — Telephone Encounter (Signed)
Please have [facility] fax any additional imaging they have performed.  I have see only the screening mammogram from 09/22/13.  It is on my desk.

## 2013-09-28 NOTE — Telephone Encounter (Signed)
I have refilled the patient's Estradiol 0.5 mg for 90 days only.  Rx sent.  The plan is for the patient to wean off the estrogen.

## 2013-09-28 NOTE — Telephone Encounter (Signed)
Spoke with [facility] mammography. Patient was seen again on 6/16 as follow up from 6/11 mammogram. Order to be faxed to our office today for review.

## 2013-09-28 NOTE — Telephone Encounter (Signed)
Dr.Silva, mammogram results from 2014 are scanned into EP[facility]. I pulled the patient's old chart and flagged all of her previous mammogram results. Chart to your desk for review.

## 2013-09-28 NOTE — Telephone Encounter (Signed)
Dr.Silva, mammogram from 6/16 to your desk for review.

## 2013-09-29 NOTE — Telephone Encounter (Signed)
Left detailed message on number provided [phone]. Okay per ROI. Advised patient that we received results from mammogram and they have been reviewed. Refill of Estradiol 0.5 mg for 90 days was sent to pharmacy of choice. Advised if patient has any further questions to feel free to call our office at [phone].  Routing to Dr.Silva as covering CC: Rebecca Cage, FNP   Routing to provider for final review. Patient agreeable to disposition. Will close encounter

## 2013-10-04 NOTE — Addendum Note (Signed)
Addended by: Antonietta Barcelona on: 10/04/2013 08:26 AM   Modules accepted: Orders

## 2013-10-10 ENCOUNTER — Encounter: Payer: Self-pay | Admitting: Interventional Cardiology

## 2013-10-10 ENCOUNTER — Encounter: Payer: Self-pay | Admitting: *Deleted

## 2013-10-17 ENCOUNTER — Encounter: Payer: Self-pay | Admitting: Interventional Cardiology

## 2013-10-17 ENCOUNTER — Ambulatory Visit (INDEPENDENT_AMBULATORY_CARE_PROVIDER_SITE_OTHER): Payer: Medicare Other | Admitting: Interventional Cardiology

## 2013-10-17 VITALS — BP 138/80 | HR 61 | Ht 63.5 in | Wt 219.0 lb

## 2013-10-17 DIAGNOSIS — E785 Hyperlipidemia, unspecified: Secondary | ICD-10-CM | POA: Insufficient documentation

## 2013-10-17 DIAGNOSIS — I10 Essential (primary) hypertension: Secondary | ICD-10-CM

## 2013-10-17 DIAGNOSIS — E782 Mixed hyperlipidemia: Secondary | ICD-10-CM

## 2013-10-17 DIAGNOSIS — I2510 Atherosclerotic heart disease of native coronary artery without angina pectoris: Secondary | ICD-10-CM | POA: Insufficient documentation

## 2013-10-17 DIAGNOSIS — I209 Angina pectoris, unspecified: Secondary | ICD-10-CM

## 2013-10-17 DIAGNOSIS — I25119 Atherosclerotic heart disease of native coronary artery with unspecified angina pectoris: Secondary | ICD-10-CM

## 2013-10-17 NOTE — Patient Instructions (Signed)
Your physician recommends that you continue on your current medications as directed. Please refer to the Current Medication list given to you today.  Your physician wants you to follow-up in: 1 year with Dr. Varanasi. You will receive a reminder letter in the mail two months in advance. If you don't receive a letter, please call our office to schedule the follow-up appointment.  

## 2013-10-17 NOTE — Progress Notes (Signed)
Patient ID: [name], female   DOB: Nov 21, 1945, 68 y.o.   MRN: [id number]    [place], [address], [place]  [address] Phone: [phone] Fax:  [phone]  Date:  10/17/2013   ID:  [name], DOB 13-Oct-1945, MRN [id number]  PCP:  Gerrit Heck, MD      History of Present Illness: [name] is a 68 y.o. female who has had CAD. She had PCI in 1999 and 2002. She has chest pressure frequently these day. It is worse in the morning and can be present when she wakes up. THere is no relation to walking.  She has relief with NTG.    Unfortunately, she has had stress due to the death of a child.  NTG use has increased as has her stress level.    She walks regularly wihtout sx.  >150 minutes of exertion /week.  Undergoing grief counseling.      Wt Readings from Last 3 Encounters:  10/17/13 219 lb (99.338 kg)  09/19/13 220 lb (99.791 kg)  02/18/13 222 lb 8 oz (100.925 kg)     Past Medical History  Diagnosis Date  . Fibrocystic breast changes   . Menorrhagia   . Glucosuria   . GERD (gastroesophageal reflux disease)   . Bone spur   . Melanoma 10/2008    right shoulder and arm  . Hernia, inguinal, left 10/2002    Current Outpatient Prescriptions  Medication Sig Dispense Refill  . ALPRAZolam (XANAX) 0.25 MG tablet Take 1 tablet by mouth at bedtime.      Marland Kitchen amLODipine (NORVASC) 10 MG tablet Take 10 mg by mouth daily.      Marland Kitchen aspirin 325 MG tablet Take 325 mg by mouth daily.      Marland Kitchen atorvastatin (LIPITOR) 10 MG tablet Take 10 mg by mouth daily.      . Eflornithine HCl (VANIQA) 13.9 % cream Apply topically 2 (two) times daily with a meal.      . estradiol (ESTRACE) 0.1 MG/GM vaginal cream 1/2 g pv qhs x 2 wks, then 1/2 g pv 2-3 times q wk  42.5 g  3  . estradiol (ESTRACE) 0.5 MG tablet Take 1 tablet (0.5 mg total) by mouth daily.  90 tablet  0  . glucosamine-chondroitin 500-400 MG tablet Take 1 tablet by mouth 2 (two) times daily.      .  hydrochlorothiazide (HYDRODIURIL) 25 MG tablet Take 25 mg by mouth daily.      . isosorbide mononitrate (IMDUR) 120 MG 24 hr tablet Take 120 mg by mouth daily.      Marland Kitchen lisinopril (PRINIVIL,ZESTRIL) 10 MG tablet Take 10 mg by mouth daily.      . metoprolol (LOPRESSOR) 100 MG tablet Take 100 mg by mouth 2 (two) times daily.      . Multiple Vitamin (MULTIVITAMIN) tablet Take 1 tablet by mouth daily.      Marland Kitchen NITROSTAT 0.4 MG SL tablet Place 0.4 mg under the tongue as needed.       . potassium chloride SA (K-DUR,KLOR-CON) 20 MEQ tablet Take 20 mEq by mouth daily.       . sertraline (ZOLOFT) 50 MG tablet Take 1 tablet by mouth daily.      . Vitamin D, Ergocalciferol, (DRISDOL) 50000 UNITS CAPS capsule Take 50,000 Units by mouth every 14 (fourteen) days.       No current facility-administered medications for this visit.    Allergies:  Allergies  Allergen Reactions  . Azithromycin   . Morphine And Related   . Other     Horse products  . Penicillins   . Tetanus Toxoids   . Augmentin [Amoxicillin-Pot Clavulanate] Rash    Social History:  The patient  reports that she has never smoked. She has never used smokeless tobacco. She reports that she drinks about .5 ounces of alcohol per week. She reports that she does not use illicit drugs.   Family History:  The patient's family history includes Cancer in her father; Diabetes in her mother; Heart disease in her father; Hypertension in her father and mother; Stroke in her mother.   ROS:  Please see the history of present illness.  No nausea, vomiting.  No fevers, chills.  No focal weakness.  No dysuria.    All other systems reviewed and negative.   PHYSICAL EXAM: VS:  BP 138/80  Pulse 61  Ht 5' 3.5" (1.613 m)  Wt 219 lb (99.338 kg)  BMI 38.18 kg/m2 Well nourished, well developed, in no acute distress HEENT: normal Neck: no JVD, no carotid bruits Cardiac:  normal S1, S2; RRR;  Lungs:  clear to auscultation bilaterally, no wheezing, rhonchi or  rales Abd: soft, nontender, no hepatomegaly Ext: no edema Skin: warm and dry Neuro:   no focal abnormalities noted  EKG:  07/11/13: NSR, no ST segment changes    ASSESSMENT AND PLAN:  Coronary atherosclerosis of native coronary artery  Continue Metoprolol Tartrate Tablet, 50 MG, 2 tablets, Orally, twice a day Change Isosorbide Mononitrate CR Tablet Extended Release 24 Hour, 60MG , TAKE 1 TABLET twice daily to help with stomach upset IMAGING: EKG    Boehler,Eileen 11/08/2012 01:56:29 PM > VARANASI,JAY 11/08/2012 02:28:08 PM > NSR, no ST segment changes   Notes: No ischemia by stress test in 2013. Still with some CP. We discussed cath but she does not want to pursue this at this time. If sx get worse,she will reconsider and let us know. She feels her sx are from stress due to the death of her child.  We spoke about cath certainly if her sx occur with exertion, wake her from sleep or feel like prior angina.  At this time, none of thise things are happening.   2. Hypercholesteremia, pure  Continue Atorvastatin Calcium Tablet, 20 MG, 1 tablet, Orally, Once a day Notes: LDL 61, controlled.    3. Essential hypertension, benign  Continue Amlodipine Besylate Tablet, 10 MG, 1 tablet, Orally, Once a day Continue Lisinopril Tablet, 10 MG, 1 tablet, Orally, Once a day Notes: Controlled.  Check BP at home.     Preventive Medicine  Adult topics discussed:  Diet: healthy diet, low calorie, low fat.  Exercise: 5 days a week, at least 30 minutes of aerobic exercise.      Signed, Mina Marble, MD, [facility] 10/17/2013 10:17 AM

## 2013-11-08 ENCOUNTER — Ambulatory Visit: Payer: Medicare (Managed Care) | Admitting: Interventional Cardiology

## 2013-11-22 ENCOUNTER — Telehealth: Payer: Self-pay | Admitting: Nurse Practitioner

## 2013-11-22 NOTE — Telephone Encounter (Signed)
Patient said she is calling saying she still has not received any results from a stool sample.

## 2013-11-22 NOTE — Telephone Encounter (Signed)
Pt needs a prior authorization sent to [facility] [phone] she needs estradiol 0.5mg  tablet

## 2013-11-22 NOTE — Telephone Encounter (Signed)
Routing to [employer], FNP for review and advise of IFOB. PA initiated. Awaiting fax with approval or denial.

## 2013-11-23 NOTE — Telephone Encounter (Signed)
Could you please read this patients IFOB test and give her the result

## 2013-11-24 LAB — FECAL OCCULT BLOOD, IMMUNOCHEMICAL: IFOBT: NEGATIVE

## 2013-11-24 NOTE — Telephone Encounter (Signed)
Left message with husband to return call.   iFob negative

## 2013-12-06 ENCOUNTER — Ambulatory Visit: Payer: Medicare (Managed Care) | Admitting: Interventional Cardiology

## 2014-01-11 NOTE — Telephone Encounter (Signed)
Pt notified via [place] on 12/12/13 by P.Grubb.

## 2014-01-20 ENCOUNTER — Telehealth: Payer: Self-pay | Admitting: Interventional Cardiology

## 2014-01-20 NOTE — Telephone Encounter (Addendum)
Lmtrc, Dr. Irish Lack, ok to proceed with heart cath?

## 2014-01-20 NOTE — Telephone Encounter (Signed)
New message     Pt want to proceed with cath.  Pt is still having occasional chest pain.  Next week and the week of oct 26th is good for her.

## 2014-01-20 NOTE — Telephone Encounter (Signed)
OK to proceed with cath.

## 2014-01-23 ENCOUNTER — Encounter: Payer: Self-pay | Admitting: Interventional Cardiology

## 2014-01-23 ENCOUNTER — Encounter: Payer: Self-pay | Admitting: Cardiology

## 2014-01-23 ENCOUNTER — Ambulatory Visit (INDEPENDENT_AMBULATORY_CARE_PROVIDER_SITE_OTHER): Payer: Medicare Other | Admitting: Interventional Cardiology

## 2014-01-23 ENCOUNTER — Other Ambulatory Visit: Payer: Medicare Other

## 2014-01-23 VITALS — BP 122/68 | HR 63 | Ht 63.5 in | Wt 222.0 lb

## 2014-01-23 DIAGNOSIS — I10 Essential (primary) hypertension: Secondary | ICD-10-CM

## 2014-01-23 DIAGNOSIS — I209 Angina pectoris, unspecified: Secondary | ICD-10-CM

## 2014-01-23 DIAGNOSIS — I25118 Atherosclerotic heart disease of native coronary artery with other forms of angina pectoris: Secondary | ICD-10-CM

## 2014-01-23 DIAGNOSIS — E782 Mixed hyperlipidemia: Secondary | ICD-10-CM

## 2014-01-23 NOTE — Progress Notes (Signed)
Patient ID: [name], female   DOB: 09-May-1945, 68 y.o.   MRN: [id number] Patient ID: [name], female   DOB: [birth year], 68 y.o.   MRN: [id number]    [place], [address] [place], [place]  [address] Phone: [phone] Fax:  [phone]  Date:  01/23/2014   ID:  [name], DOB [birth year], MRN [id number]  PCP:  Gerrit Heck, MD      History of Present Illness: [name] is a 68 y.o. female who has had CAD. She had PCI in 1999 and 2002. She has chest pressure frequently these day. It is worse in the morning and can be present when she wakes up. THere is no relation to walking.  She has relief with NTG.    Unfortunately, she has had stress due to the death of a child.  NTG use has increased as has her stress level.    She walks regularly wihtout sx.  >150 minutes of exertion /week.  Undergoing grief counseling.      Wt Readings from Last 3 Encounters:  01/23/14 222 lb (100.699 kg)  10/17/13 219 lb (99.338 kg)  09/19/13 220 lb (99.791 kg)     Past Medical History  Diagnosis Date  . Fibrocystic breast changes   . Menorrhagia   . Glucosuria   . GERD (gastroesophageal reflux disease)   . Bone spur   . Melanoma 10/2008    right shoulder and arm  . Hernia, inguinal, left 10/2002    Current Outpatient Prescriptions  Medication Sig Dispense Refill  . ALPRAZolam (XANAX) 0.25 MG tablet Take 1 tablet by mouth at bedtime.      Marland Kitchen amLODipine (NORVASC) 10 MG tablet Take 10 mg by mouth daily.      Marland Kitchen aspirin 325 MG tablet Take 325 mg by mouth daily.      Marland Kitchen atorvastatin (LIPITOR) 10 MG tablet Take 10 mg by mouth daily.      . Eflornithine HCl (VANIQA) 13.9 % cream Apply topically 2 (two) times daily with a meal.      . estradiol (ESTRACE) 0.1 MG/GM vaginal cream 1/2 g pv qhs x 2 wks, then 1/2 g pv 2-3 times q wk  42.5 g  3  . estradiol (ESTRACE) 0.5 MG tablet Take 1 tablet (0.5 mg total) by mouth daily.  90 tablet  0  . glucosamine-chondroitin  500-400 MG tablet Take 1 tablet by mouth 2 (two) times daily.      . hydrochlorothiazide (HYDRODIURIL) 25 MG tablet Take 25 mg by mouth daily.      . isosorbide mononitrate (IMDUR) 120 MG 24 hr tablet Take 120 mg by mouth daily.      Marland Kitchen lisinopril (PRINIVIL,ZESTRIL) 10 MG tablet Take 10 mg by mouth daily.      . metoprolol (LOPRESSOR) 100 MG tablet Take 100 mg by mouth 2 (two) times daily.      . Multiple Vitamin (MULTIVITAMIN) tablet Take 1 tablet by mouth daily.      Marland Kitchen NITROSTAT 0.4 MG SL tablet Place 0.4 mg under the tongue as needed.       . potassium chloride SA (K-DUR,KLOR-CON) 20 MEQ tablet Take 20 mEq by mouth daily.       . sertraline (ZOLOFT) 50 MG tablet Take 1 tablet by mouth daily.      . Vitamin D, Ergocalciferol, (DRISDOL) 50000 UNITS CAPS capsule Take 50,000 Units by mouth every 14 (fourteen) days.  No current facility-administered medications for this visit.    Allergies:    Allergies  Allergen Reactions  . Azithromycin   . Morphine And Related   . Other     Horse products  . Penicillins   . Tetanus Toxoids   . Augmentin [Amoxicillin-Pot Clavulanate] Rash    Social History:  The patient  reports that she has never smoked. She has never used smokeless tobacco. She reports that she drinks about .5 ounces of alcohol per week. She reports that she does not use illicit drugs.   Family History:  The patient's family history includes Cancer in her father; Diabetes in her mother; Heart disease in her father; Hypertension in her father and mother; Stroke in her mother.   ROS:  Please see the history of present illness.  No nausea, vomiting.  No fevers, chills.  No focal weakness.  No dysuria.    All other systems reviewed and negative.   PHYSICAL EXAM: VS:  BP 122/68  Pulse 63  Ht 5' 3.5" (1.613 m)  Wt 222 lb (100.699 kg)  BMI 38.70 kg/m2  SpO2 97% Well nourished, well developed, in no acute distress HEENT: normal Neck: no JVD, no carotid bruits Cardiac:  normal  S1, S2; RRR;  Lungs:  clear to auscultation bilaterally, no wheezing, rhonchi or rales Abd: soft, nontender, no hepatomegaly Ext: no edema Skin: warm and dry Neuro:   no focal abnormalities noted  EKG:  07/11/13: NSR, no ST segment changes    ASSESSMENT AND PLAN:  Coronary atherosclerosis of native coronary artery  Continue Metoprolol Tartrate Tablet, 50 MG, 2 tablets, Orally, twice a day Change Isosorbide Mononitrate CR Tablet Extended Release 24 Hour, 60MG , TAKE 1 TABLET twice daily to help with stomach upset IMAGING: EKG    Boehler,Eileen 11/08/2012 01:56:29 PM > VARANASI,JAY 11/08/2012 02:28:08 PM > NSR, no ST segment changes   Notes: No ischemia by stress test in 2013. Still with some CP. Plan for cath.  She wants to pursue this.  Episode of chest pain last night at rest and wanted to use SL NTG. Risks and benefits explained.  She is willing to proceed. All questions answered.   2. Hypercholesteremia, pure  Continue Atorvastatin Calcium Tablet, 20 MG, 1 tablet, Orally, Once a day Notes: LDL 61, controlled.    3. Essential hypertension, benign  Continue Amlodipine Besylate Tablet, 10 MG, 1 tablet, Orally, Once a day Continue Lisinopril Tablet, 10 MG, 1 tablet, Orally, Once a day Notes: Controlled.  Check BP at home.     Preventive Medicine  Adult topics discussed:  Diet: healthy diet, low calorie, low fat.  Exercise: 5 days a week, at least 30 minutes of aerobic exercise.      Signed, Mina Marble, MD, [facility] 01/23/2014 4:36 PM

## 2014-01-23 NOTE — Patient Instructions (Signed)
Your physician has requested that you have a cardiac catheterization. Cardiac catheterization is used to diagnose and/or treat various heart conditions. Doctors may recommend this procedure for a number of different reasons. The most common reason is to evaluate chest pain. Chest pain can be a symptom of coronary artery disease (CAD), and cardiac catheterization can show whether plaque is narrowing or blocking your heart's arteries. This procedure is also used to evaluate the valves, as well as measure the blood flow and oxygen levels in different parts of your heart. For further information please visit [email or web address]. Please follow instruction sheet, as given.  Your physician recommends that you return for lab work today.

## 2014-01-23 NOTE — Telephone Encounter (Signed)
Spoke with pt and appt made for today for pre-cath w/u with labs.

## 2014-01-24 ENCOUNTER — Encounter (HOSPITAL_COMMUNITY): Payer: Self-pay | Admitting: Pharmacy Technician

## 2014-01-24 ENCOUNTER — Other Ambulatory Visit: Payer: Self-pay | Admitting: Interventional Cardiology

## 2014-01-24 DIAGNOSIS — I209 Angina pectoris, unspecified: Secondary | ICD-10-CM

## 2014-01-24 LAB — BASIC METABOLIC PANEL
BUN: 9 mg/dL (ref 6–23)
CO2: 31 mEq/L (ref 19–32)
Calcium: 9.3 mg/dL (ref 8.4–10.5)
Chloride: 101 mEq/L (ref 96–112)
Creatinine, Ser: 0.7 mg/dL (ref 0.4–1.2)
GFR: 88.49 mL/min (ref >60.00)
Glucose, Bld: 103 mg/dL — ABNORMAL HIGH (ref 70–99)
Potassium: 3.3 mEq/L — ABNORMAL LOW (ref 3.5–5.1)
Sodium: 138 mEq/L (ref 135–145)

## 2014-01-24 LAB — CBC WITH DIFFERENTIAL/PLATELET
Basophils Absolute: 0 10*3/uL (ref 0.0–0.1)
Basophils Relative: 0.5 % (ref 0.0–3.0)
Eosinophils Absolute: 0.2 10*3/uL (ref 0.0–0.7)
Eosinophils Relative: 1.9 % (ref 0.0–5.0)
HCT: 44 % (ref 36.0–46.0)
Hemoglobin: 14.4 g/dL (ref 12.0–15.0)
Lymphocytes Relative: 29.6 % (ref 12.0–46.0)
Lymphs Abs: 2.4 10*3/uL (ref 0.7–4.0)
MCHC: 32.9 g/dL (ref 30.0–36.0)
MCV: 94.4 fl (ref 78.0–100.0)
Monocytes Absolute: 0.5 10*3/uL (ref 0.1–1.0)
Monocytes Relative: 6.2 % (ref 3.0–12.0)
Neutro Abs: 5 10*3/uL (ref 1.4–7.7)
Neutrophils Relative %: 61.8 % (ref 43.0–77.0)
Platelets: 246 10*3/uL (ref 150.0–400.0)
RBC: 4.66 Mil/uL (ref 3.87–5.11)
RDW: 13.2 % (ref 11.5–15.5)
WBC: 8.1 10*3/uL (ref 4.0–10.5)

## 2014-01-24 LAB — PROTIME-INR
INR: 1 ratio (ref 0.8–1.0)
Prothrombin Time: 11.2 s (ref 9.6–13.1)

## 2014-01-25 ENCOUNTER — Encounter (HOSPITAL_COMMUNITY)
Admission: RE | Disposition: A | Discharge status: Home / Self Care | Payer: Medicare Other | Source: Ambulatory Visit | Attending: Interventional Cardiology

## 2014-01-25 ENCOUNTER — Ambulatory Visit (HOSPITAL_COMMUNITY)
Admission: RE | Admit: 2014-01-25 | Discharge: 2014-01-25 | Disposition: A | Discharge status: Home / Self Care | Payer: Medicare Other | Source: Ambulatory Visit | Attending: Interventional Cardiology | Admitting: Interventional Cardiology

## 2014-01-25 DIAGNOSIS — T82858A Stenosis of vascular prosthetic devices, implants and grafts, initial encounter: Secondary | ICD-10-CM | POA: Diagnosis not present

## 2014-01-25 DIAGNOSIS — Z955 Presence of coronary angioplasty implant and graft: Secondary | ICD-10-CM | POA: Insufficient documentation

## 2014-01-25 DIAGNOSIS — E780 Pure hypercholesterolemia: Secondary | ICD-10-CM | POA: Insufficient documentation

## 2014-01-25 DIAGNOSIS — I10 Essential (primary) hypertension: Secondary | ICD-10-CM | POA: Insufficient documentation

## 2014-01-25 DIAGNOSIS — I209 Angina pectoris, unspecified: Secondary | ICD-10-CM | POA: Diagnosis present

## 2014-01-25 DIAGNOSIS — I25119 Atherosclerotic heart disease of native coronary artery with unspecified angina pectoris: Secondary | ICD-10-CM | POA: Diagnosis not present

## 2014-01-25 DIAGNOSIS — Z79890 Hormone replacement therapy (postmenopausal): Secondary | ICD-10-CM | POA: Insufficient documentation

## 2014-01-25 DIAGNOSIS — Z7982 Long term (current) use of aspirin: Secondary | ICD-10-CM | POA: Insufficient documentation

## 2014-01-25 DIAGNOSIS — K219 Gastro-esophageal reflux disease without esophagitis: Secondary | ICD-10-CM | POA: Insufficient documentation

## 2014-01-25 HISTORY — PX: LEFT HEART CATHETERIZATION WITH CORONARY ANGIOGRAM: SHX5451

## 2014-01-25 LAB — POTASSIUM: Potassium: 3.8 mEq/L (ref 3.7–5.3)

## 2014-01-25 SURGERY — LEFT HEART CATHETERIZATION WITH CORONARY ANGIOGRAM
Anesthesia: LOCAL

## 2014-01-25 MED ORDER — NITROGLYCERIN 1 MG/10 ML FOR IR/CATH LAB
INTRA_ARTERIAL | Status: AC
  Filled 2014-01-25: qty 10

## 2014-01-25 MED ORDER — SODIUM CHLORIDE 0.9 % IV SOLN: 1.0000 mL/kg/h | INTRAVENOUS | Status: DC

## 2014-01-25 MED ORDER — FENTANYL CITRATE 0.05 MG/ML IJ SOLN
INTRAMUSCULAR | Status: AC
  Filled 2014-01-25: qty 2

## 2014-01-25 MED ORDER — SODIUM CHLORIDE 0.9 % IV SOLN
INTRAVENOUS | Status: DC
  Administered 2014-01-25: 11:00:00 via INTRAVENOUS

## 2014-01-25 MED ORDER — ASPIRIN 81 MG PO CHEW
CHEWABLE_TABLET | ORAL | Status: AC
  Administered 2014-01-25: 81 mg via ORAL
  Filled 2014-01-25: qty 1

## 2014-01-25 MED ORDER — MIDAZOLAM HCL 2 MG/2ML IJ SOLN
INTRAMUSCULAR | Status: AC
  Filled 2014-01-25: qty 2

## 2014-01-25 MED ORDER — HEPARIN (PORCINE) IN NACL 2-0.9 UNIT/ML-% IJ SOLN
INTRAMUSCULAR | Status: AC
  Filled 2014-01-25: qty 1500

## 2014-01-25 MED ORDER — ASPIRIN 81 MG PO CHEW
81.0000 mg | CHEWABLE_TABLET | ORAL | Status: AC
  Administered 2014-01-25: 81 mg via ORAL

## 2014-01-25 MED ORDER — DIAZEPAM 5 MG PO TABS
ORAL_TABLET | ORAL | Status: AC
  Administered 2014-01-25: 5 mg via ORAL
  Filled 2014-01-25: qty 1

## 2014-01-25 MED ORDER — SODIUM CHLORIDE 0.9 % IJ SOLN: 3.0000 mL | INTRAMUSCULAR | Status: DC | PRN

## 2014-01-25 MED ORDER — DIAZEPAM 5 MG PO TABS
5.0000 mg | ORAL_TABLET | Freq: Once | ORAL | Status: AC
  Administered 2014-01-25: 5 mg via ORAL

## 2014-01-25 MED ORDER — SODIUM CHLORIDE 0.9 % IV SOLN: 250.0000 mL | INTRAVENOUS | Status: DC | PRN

## 2014-01-25 MED ORDER — VERAPAMIL HCL 2.5 MG/ML IV SOLN
INTRAVENOUS | Status: AC
  Filled 2014-01-25: qty 2

## 2014-01-25 MED ORDER — LIDOCAINE HCL (PF) 1 % IJ SOLN
INTRAMUSCULAR | Status: AC
  Filled 2014-01-25: qty 30

## 2014-01-25 MED ORDER — HEPARIN SODIUM (PORCINE) 1000 UNIT/ML IJ SOLN
INTRAMUSCULAR | Status: AC
  Filled 2014-01-25: qty 1

## 2014-01-25 MED ORDER — SODIUM CHLORIDE 0.9 % IJ SOLN: 3.0000 mL | Freq: Two times a day (BID) | INTRAMUSCULAR | Status: DC

## 2014-01-25 NOTE — H&P (View-Only) (Signed)
Patient ID: [name], female   DOB: July 27, 1945, 68 y.o.   MRN: [id number] Patient ID: [name], female   DOB: 1945/08/15, 68 y.o.   MRN: [id number]    [address] [place] [place], [place]  [address] Phone: [phone] Fax:  [phone]  Date:  01/23/2014   ID:  [name], DOB 25-Aug-1945, MRN [id number]  PCP:  Gerrit Heck, MD      History of Present Illness: [name] is a 68 y.o. female who has had CAD. She had PCI in 1999 and 2002. She has chest pressure frequently these day. It is worse in the morning and can be present when she wakes up. THere is no relation to walking.  She has relief with NTG.    Unfortunately, she has had stress due to the death of a child.  NTG use has increased as has her stress level.    She walks regularly wihtout sx.  >150 minutes of exertion /week.  Undergoing grief counseling.      Wt Readings from Last 3 Encounters:  01/23/14 222 lb (100.699 kg)  10/17/13 219 lb (99.338 kg)  09/19/13 220 lb (99.791 kg)     Past Medical History  Diagnosis Date  . Fibrocystic breast changes   . Menorrhagia   . Glucosuria   . GERD (gastroesophageal reflux disease)   . Bone spur   . Melanoma 10/2008    right shoulder and arm  . Hernia, inguinal, left 10/2002    Current Outpatient Prescriptions  Medication Sig Dispense Refill  . ALPRAZolam (XANAX) 0.25 MG tablet Take 1 tablet by mouth at bedtime.      Marland Kitchen amLODipine (NORVASC) 10 MG tablet Take 10 mg by mouth daily.      Marland Kitchen aspirin 325 MG tablet Take 325 mg by mouth daily.      Marland Kitchen atorvastatin (LIPITOR) 10 MG tablet Take 10 mg by mouth daily.      . Eflornithine HCl (VANIQA) 13.9 % cream Apply topically 2 (two) times daily with a meal.      . estradiol (ESTRACE) 0.1 MG/GM vaginal cream 1/2 g pv qhs x 2 wks, then 1/2 g pv 2-3 times q wk  42.5 g  3  . estradiol (ESTRACE) 0.5 MG tablet Take 1 tablet (0.5 mg total) by mouth daily.  90 tablet  0  . glucosamine-chondroitin  500-400 MG tablet Take 1 tablet by mouth 2 (two) times daily.      . hydrochlorothiazide (HYDRODIURIL) 25 MG tablet Take 25 mg by mouth daily.      . isosorbide mononitrate (IMDUR) 120 MG 24 hr tablet Take 120 mg by mouth daily.      Marland Kitchen lisinopril (PRINIVIL,ZESTRIL) 10 MG tablet Take 10 mg by mouth daily.      . metoprolol (LOPRESSOR) 100 MG tablet Take 100 mg by mouth 2 (two) times daily.      . Multiple Vitamin (MULTIVITAMIN) tablet Take 1 tablet by mouth daily.      Marland Kitchen NITROSTAT 0.4 MG SL tablet Place 0.4 mg under the tongue as needed.       . potassium chloride SA (K-DUR,KLOR-CON) 20 MEQ tablet Take 20 mEq by mouth daily.       . sertraline (ZOLOFT) 50 MG tablet Take 1 tablet by mouth daily.      . Vitamin D, Ergocalciferol, (DRISDOL) 50000 UNITS CAPS capsule Take 50,000 Units by mouth every 14 (fourteen) days.  No current facility-administered medications for this visit.    Allergies:    Allergies  Allergen Reactions  . Azithromycin   . Morphine And Related   . Other     Horse products  . Penicillins   . Tetanus Toxoids   . Augmentin [Amoxicillin-Pot Clavulanate] Rash    Social History:  The patient  reports that she has never smoked. She has never used smokeless tobacco. She reports that she drinks about .5 ounces of alcohol per week. She reports that she does not use illicit drugs.   Family History:  The patient's family history includes Cancer in her father; Diabetes in her mother; Heart disease in her father; Hypertension in her father and mother; Stroke in her mother.   ROS:  Please see the history of present illness.  No nausea, vomiting.  No fevers, chills.  No focal weakness.  No dysuria.    All other systems reviewed and negative.   PHYSICAL EXAM: VS:  BP 122/68  Pulse 63  Ht 5' 3.5" (1.613 m)  Wt 222 lb (100.699 kg)  BMI 38.70 kg/m2  SpO2 97% Well nourished, well developed, in no acute distress HEENT: normal Neck: no JVD, no carotid bruits Cardiac:  normal  S1, S2; RRR;  Lungs:  clear to auscultation bilaterally, no wheezing, rhonchi or rales Abd: soft, nontender, no hepatomegaly Ext: no edema Skin: warm and dry Neuro:   no focal abnormalities noted  EKG:  07/11/13: NSR, no ST segment changes    ASSESSMENT AND PLAN:  Coronary atherosclerosis of native coronary artery  Continue Metoprolol Tartrate Tablet, 50 MG, 2 tablets, Orally, twice a day Change Isosorbide Mononitrate CR Tablet Extended Release 24 Hour, 60MG , TAKE 1 TABLET twice daily to help with stomach upset IMAGING: EKG    Boehler,Eileen 11/08/2012 01:56:29 PM > VARANASI,JAY 11/08/2012 02:28:08 PM > NSR, no ST segment changes   Notes: No ischemia by stress test in 2013. Still with some CP. Plan for cath.  She wants to pursue this.  Episode of chest pain last night at rest and wanted to use SL NTG. Risks and benefits explained.  She is willing to proceed. All questions answered.   2. Hypercholesteremia, pure  Continue Atorvastatin Calcium Tablet, 20 MG, 1 tablet, Orally, Once a day Notes: LDL 61, controlled.    3. Essential hypertension, benign  Continue Amlodipine Besylate Tablet, 10 MG, 1 tablet, Orally, Once a day Continue Lisinopril Tablet, 10 MG, 1 tablet, Orally, Once a day Notes: Controlled.  Check BP at home.     Preventive Medicine  Adult topics discussed:  Diet: healthy diet, low calorie, low fat.  Exercise: 5 days a week, at least 30 minutes of aerobic exercise.      Signed, Mina Marble, MD, [facility] 01/23/2014 4:36 PM

## 2014-01-25 NOTE — Interval H&P Note (Signed)
Cath Lab Visit (complete for each Cath Lab visit)  Clinical Evaluation Leading to the Procedure:   ACS: No.  Non-ACS:    Anginal Classification: CCS III  Anti-ischemic medical therapy: Maximal Therapy (2 or more classes of medications)  Non-Invasive Test Results: No non-invasive testing performed  Prior CABG: No previous CABG      History and Physical Interval Note:  01/25/2014 1:29 PM  [name]  has presented today for surgery, with the diagnosis of unstable angina  The various methods of treatment have been discussed with the patient and family. After consideration of risks, benefits and other options for treatment, the patient has consented to  Procedure(s): LEFT HEART CATHETERIZATION WITH CORONARY ANGIOGRAM (N/A) as a surgical intervention .  The patient's history has been reviewed, patient examined, no change in status, stable for surgery.  I have reviewed the patient's chart and labs.  Questions were answered to the patient's satisfaction.     VARANASI,JAYADEEP S.

## 2014-01-25 NOTE — Discharge Instructions (Signed)
Follow post radial cath instructions ° °Radial Site Care °Refer to this sheet in the next few weeks. These instructions provide you with information on caring for yourself after your procedure. Your caregiver may also give you more specific instructions. Your treatment has been planned according to current medical practices, but problems sometimes occur. Call your caregiver if you have any problems or questions after your procedure. °HOME CARE INSTRUCTIONS °· You may shower the day after the procedure. Remove the bandage (dressing) and gently wash the site with plain soap and water. Gently pat the site dry. °· Do not apply powder or lotion to the site. °· Do not submerge the affected site in water for 3 to 5 days. °· Inspect the site at least twice daily. °· Do not flex or bend the affected arm for 24 hours. °· No lifting over 5 pounds (2.3 kg) for 5 days after your procedure. °· Do not drive home if you are discharged the same day of the procedure. Have someone else drive you. °· You may drive 24 hours after the procedure unless otherwise instructed by your caregiver. °· Do not operate machinery or power tools for 24 hours. °· A responsible adult should be with you for the first 24 hours after you arrive home. °What to expect: °· Any bruising will usually fade within 1 to 2 weeks. °· Blood that collects in the tissue (hematoma) may be painful to the touch. It should usually decrease in size and tenderness within 1 to 2 weeks. °SEEK IMMEDIATE MEDICAL CARE IF: °· You have unusual pain at the radial site. °· You have redness, warmth, swelling, or pain at the radial site. °· You have drainage (other than a small amount of blood on the dressing). °· You have chills. °· You have a fever or persistent symptoms for more than 72 hours. °· You have a fever and your symptoms suddenly get worse. °· Your arm becomes pale, cool, tingly, or numb. °· You have heavy bleeding from the site. Hold pressure on the site. °Document  Released: 05/03/2010 Document Revised: 06/23/2011 Document Reviewed: 05/03/2010 °Exit[employer]® Patient Information ©2015 [employer]. This information is not intended to replace advice given to you by your health care provider. Make sure you discuss any questions you have with your health care provider. ° °

## 2014-01-25 NOTE — CV Procedure (Signed)
       PROCEDURE:  Left heart catheterization with selective coronary angiography, left ventriculogram.  INDICATIONS:  Angina  The risks, benefits, and details of the procedure were explained to the patient.  The patient verbalized understanding and wanted to proceed.  Informed written consent was obtained.  PROCEDURE TECHNIQUE:  After Xylocaine anesthesia a 25F slender sheath was placed in the right radial artery with a single anterior needle wall stick.   Right coronary angiography was done using a Judkins R4 guide catheter.  Left coronary angiography was done using a Judkins L3.5 guide catheter.  Left ventriculography was done using a pigtail catheter.  A TR band was used for hemostasis.   CONTRAST:  Total of 60 cc.  COMPLICATIONS:  None.    HEMODYNAMICS:  Aortic pressure was 93/47; LV pressure was 95/3; LVEDP 10.  There was no gradient between the left ventricle and aorta.    ANGIOGRAPHIC DATA:   The left main coronary artery is widely patent.  The left anterior descending artery is a large vessel which wraps around the apex. A stent in the mid vessel has mild in-stent restenosis. There is a large diagonal which arises just proximal to the LAD stent. The diagonal is widely patent.  The left circumflex artery is a large vessel. There is mild proximal disease. There is a first obtuse marginal which has moderate proximal disease. In the distal circumflex, there is a 40-50% eccentric stenosis.  The right coronary artery is a large dominant vessel. There is mild disease at the ostium. There is a mild irregularities throughout the vessel. Compared to the 2002 films, there is a mild progression of disease in the RCA.Marland Kitchen  LEFT VENTRICULOGRAM:  Left ventricular angiogram was done in the 30 RAO projection and revealed normal left ventricular wall motion and systolic function with an estimated ejection fraction of 60 %.  LVEDP was 10 mmHg.  IMPRESSIONS:  1. Widely patent left main coronary  artery. 2. Patent stents in the mid left anterior descending artery with only mild in-stent restenosis. Patent large diagonal branch. 3. Mild disease in the left circumflex artery and its branches. 4. Mild scattered disease in the right coronary artery. 5. Normal left ventricular systolic function.  LVEDP 10 mmHg.  Ejection fraction 55%.  RECOMMENDATION:  Medical therapy.  Continue aggressive secondary prevention.

## 2014-01-30 ENCOUNTER — Other Ambulatory Visit: Payer: Self-pay | Admitting: Interventional Cardiology

## 2014-01-31 ENCOUNTER — Other Ambulatory Visit: Payer: Self-pay | Admitting: *Deleted

## 2014-01-31 NOTE — Telephone Encounter (Signed)
Should this patient be on atorvastatin 10mg  or 20mg ? Last office list has 10mg , but Dr Hassell Done dictation on this office note says continue atorvastatin 20mg . Please advise. Thanks, MI

## 2014-01-31 NOTE — Telephone Encounter (Signed)
I really don't know the answer. It looks like she is most likely on lipitor 10 mg daily. I don't know where he got 20 mg from in the note. Please call the patient to confirm her dose.   Thank you

## 2014-02-01 ENCOUNTER — Other Ambulatory Visit: Payer: Self-pay

## 2014-02-01 MED ORDER — ATORVASTATIN CALCIUM 10 MG PO TABS: 10.0000 mg | ORAL_TABLET | Freq: Every day | ORAL | Status: DC

## 2014-02-01 MED ORDER — POTASSIUM CHLORIDE CRYS ER 20 MEQ PO TBCR: 20.0000 meq | EXTENDED_RELEASE_TABLET | Freq: Two times a day (BID) | ORAL | Status: DC

## 2014-02-01 NOTE — Telephone Encounter (Signed)
Spoke with patient and she confirmed that her dose is 10mg  now. It was reduced from 20mg  about a year or so ago. I will send in rx.

## 2014-02-02 NOTE — Telephone Encounter (Signed)
OK 

## 2014-02-03 NOTE — Progress Notes (Signed)
Appointment with [name] 02/10/2014 at 3:00 pm for radial site check.

## 2014-02-06 ENCOUNTER — Telehealth: Payer: Self-pay | Admitting: Nurse Practitioner

## 2014-02-06 NOTE — Telephone Encounter (Signed)
Left message to call back. PG

## 2014-02-09 NOTE — Telephone Encounter (Signed)
Called patient about information from [facility] and concerns about her being on ERT.  We again are concerned with her taking Estradiol and Estrace vaginal cream.  She stated that cardiology evaluation recently was negative and that stent placement was in 1999.  I explained that this still puts her at high risk for DVT.  She is in agreement to hold med's and has a recheck appoinmetn there tomorrow and will get their recommendations as well.  She feels the estrogen really helps her with skin dryness and better voice quality.  She does understand rationale.

## 2014-02-10 ENCOUNTER — Encounter: Payer: Self-pay | Admitting: Cardiology

## 2014-02-10 ENCOUNTER — Ambulatory Visit (INDEPENDENT_AMBULATORY_CARE_PROVIDER_SITE_OTHER): Payer: Medicare Other | Admitting: Cardiology

## 2014-02-10 VITALS — BP 120/70 | HR 63 | Ht 62.5 in | Wt 220.4 lb

## 2014-02-10 DIAGNOSIS — I2510 Atherosclerotic heart disease of native coronary artery without angina pectoris: Secondary | ICD-10-CM

## 2014-02-10 DIAGNOSIS — I10 Essential (primary) hypertension: Secondary | ICD-10-CM

## 2014-02-10 DIAGNOSIS — E782 Mixed hyperlipidemia: Secondary | ICD-10-CM

## 2014-02-10 NOTE — Assessment & Plan Note (Signed)
Stable coronary arteries by recent cath.  Continue medications.

## 2014-02-10 NOTE — Assessment & Plan Note (Signed)
treated

## 2014-02-10 NOTE — Patient Instructions (Signed)
Your physician wants you to follow-up in: 6 Months with Dr Irish Lack. You will receive a reminder letter in the mail two months in advance. If you don't receive a letter, please call our office to schedule the follow-up appointment.

## 2014-02-10 NOTE — Assessment & Plan Note (Signed)
controlled 

## 2014-02-10 NOTE — Progress Notes (Signed)
02/10/2014   PCP: Gerrit Heck, MD   Chief Complaint  Patient presents with  . Appointment    no cp, sob or swelling-post cath    Primary Cardiologist:Dr. Ed Blalock  HPI:  [name] is a 68 y.o. female who has had CAD. She had PCI in 1999 and 2002. She had been having chest pressure frequently worse in the morning and could be present when she wakes up. There is no relation to walking. She has relief with NTG. Unfortunately, she has had stress due to the death of a child. NTG use has increased as has her stress level.  1. Widely patent left main coronary artery. 2. Patent stents in the mid left anterior descending artery with only mild in-stent restenosis. Patent large diagonal branch. 3. Mild disease in the left circumflex artery and its branches. 4. Mild scattered disease in the right coronary artery. 5. Normal left ventricular systolic function. LVEDP 10 mmHg. Ejection fraction   Medical therapy recommended.  She presents today for follow up.  No further chest pain.    She has one issue she is on estrogen and would like to continue it.  Her insurance would like our approval if we agree that she can continue.  Will have Dr Beau Fanny review.  She has not yet started to exercise, still grieving her child's death.   Allergies  Allergen Reactions  . Azithromycin   . Morphine And Related   . Other     Horse products  . Penicillins   . Tetanus Toxoids   . Augmentin [Amoxicillin-Pot Clavulanate] Rash    Current Outpatient Prescriptions  Medication Sig Dispense Refill  . amLODipine (NORVASC) 10 MG tablet Take 10 mg by mouth daily.      Marland Kitchen aspirin 325 MG tablet Take 325 mg by mouth daily.      Marland Kitchen atorvastatin (LIPITOR) 10 MG tablet Take 1 tablet (10 mg total) by mouth daily.  90 tablet  3  . Eflornithine HCl (VANIQA) 13.9 % cream Apply topically 2 (two) times daily with a meal.      . estradiol (ESTRACE) 0.1 MG/GM vaginal cream Place 0.5  Applicatorfuls vaginally 2 (two) times a week.      . estradiol (ESTRACE) 0.5 MG tablet Take 1 tablet (0.5 mg total) by mouth daily.  90 tablet  0  . glucosamine-chondroitin 500-400 MG tablet Take 1 tablet by mouth 2 (two) times daily.      . hydrochlorothiazide (HYDRODIURIL) 25 MG tablet Take 25 mg by mouth daily.      . isosorbide mononitrate (IMDUR) 120 MG 24 hr tablet Take 120 mg by mouth daily.      Marland Kitchen lisinopril (PRINIVIL,ZESTRIL) 10 MG tablet Take 10 mg by mouth daily.      . metoprolol (LOPRESSOR) 100 MG tablet Take 100 mg by mouth 2 (two) times daily.      . Multiple Vitamin (MULTIVITAMIN) tablet Take 1 tablet by mouth daily.      Marland Kitchen NITROSTAT 0.4 MG SL tablet Place 0.4 mg under the tongue every 5 (five) minutes as needed for chest pain.       . potassium chloride SA (K-DUR,KLOR-CON) 20 MEQ tablet Take 1 tablet (20 mEq total) by mouth 2 (two) times daily.  180 tablet  3  . Vitamin D, Ergocalciferol, (DRISDOL) 50000 UNITS CAPS capsule Take 50,000 Units by mouth every 14 (fourteen) days.       No current facility-administered medications for  this visit.    Past Medical History  Diagnosis Date  . Fibrocystic breast changes   . Menorrhagia   . Glucosuria   . GERD (gastroesophageal reflux disease)   . Bone spur   . Melanoma 10/2008    right shoulder and arm  . Hernia, inguinal, left 10/2002    Past Surgical History  Procedure Laterality Date  . Hysteroscopy  2/98    D&C (polyps)  . Total abdominal hysterectomy      LSO     Failed TVH  . Coronary angioplasty with stent placement  10/99  . Angioplasty  2000  . Node dissection      neg  . Appendectomy      BDZ:HGDJMEQ:AS colds or fevers, no weight changes CV:see HPI PUL:see HPI GI:no diarrhea constipation or melena, no indigestion GU:no hematuria, no dysuria MS:no joint pain, no claudication Neuro:no syncope, no lightheadedness   Wt Readings from Last 3 Encounters:  02/10/14 220 lb 6.4 oz (99.973 kg)  01/25/14 222 lb 4  oz (100.812 kg)  01/25/14 222 lb 4 oz (100.812 kg)    PHYSICAL EXAM BP 120/70  Pulse 63  Ht 5' 2.5" (1.588 m)  Wt 220 lb 6.4 oz (99.973 kg)  BMI 39.64 kg/m2 General:Pleasant affect, NAD Skin:Warm and dry, brisk capillary refill HEENT:normocephalic, sclera clear, mucus membranes moist Neck:supple, no JVD, no bruits  Heart:S1S2 RRR without murmur, gallup, rub or click Lungs:clear without rales, rhonchi, or wheezes TMH:DQQI, non tender, + BS, do not palpate liver spleen or masses Ext:no lower ext edema, 2+ pedal pulses, 2+ radial pulses- rt radial cath site without complications.  Neuro:alert and oriented X 3, MAE, follows commands, + facial symmetry  WLN:LGXQ  ASSESSMENT AND PLAN Coronary atherosclerosis of native coronary artery Stable coronary arteries by recent cath.  Continue medications.  Mixed hyperlipidemia treated  Essential hypertension, benign controlled

## 2014-02-13 ENCOUNTER — Encounter: Payer: Self-pay | Admitting: Cardiology

## 2014-02-14 ENCOUNTER — Ambulatory Visit (HOSPITAL_COMMUNITY)
Admission: RE | Admit: 2014-02-14 | Discharge: 2014-02-14 | Disposition: A | Discharge status: Home / Self Care | Payer: Medicare Other | Source: Ambulatory Visit | Attending: Oncology | Admitting: Oncology

## 2014-02-14 ENCOUNTER — Other Ambulatory Visit (HOSPITAL_BASED_OUTPATIENT_CLINIC_OR_DEPARTMENT_OTHER): Payer: Medicare Other

## 2014-02-14 DIAGNOSIS — R05 Cough: Secondary | ICD-10-CM | POA: Insufficient documentation

## 2014-02-14 DIAGNOSIS — C439 Malignant melanoma of skin, unspecified: Secondary | ICD-10-CM | POA: Diagnosis not present

## 2014-02-14 DIAGNOSIS — C4360 Malignant melanoma of unspecified upper limb, including shoulder: Secondary | ICD-10-CM

## 2014-02-14 DIAGNOSIS — I252 Old myocardial infarction: Secondary | ICD-10-CM | POA: Diagnosis not present

## 2014-02-14 LAB — COMPREHENSIVE METABOLIC PANEL (CC13)
ALT: 37 U/L (ref 0–55)
AST: 25 U/L (ref 5–34)
Albumin: 4.1 g/dL (ref 3.5–5.0)
Alkaline Phosphatase: 68 U/L (ref 40–150)
Anion Gap: 11 mEq/L (ref 3–11)
BUN: 10.1 mg/dL (ref 7.0–26.0)
CO2: 27 mEq/L (ref 22–29)
Calcium: 9.8 mg/dL (ref 8.4–10.4)
Chloride: 105 mEq/L (ref 98–109)
Creatinine: 0.8 mg/dL (ref 0.6–1.1)
Glucose: 107 mg/dl (ref 70–140)
Potassium: 3.9 mEq/L (ref 3.5–5.1)
Sodium: 143 mEq/L (ref 136–145)
Total Bilirubin: 1.32 mg/dL — ABNORMAL HIGH (ref 0.20–1.20)
Total Protein: 7.3 g/dL (ref 6.4–8.3)

## 2014-02-14 LAB — CBC WITH DIFFERENTIAL/PLATELET
BASO%: 0.8 % (ref 0.0–2.0)
Basophils Absolute: 0.1 10*3/uL (ref 0.0–0.1)
EOS%: 1.3 % (ref 0.0–7.0)
Eosinophils Absolute: 0.1 10*3/uL (ref 0.0–0.5)
HCT: 47 % — ABNORMAL HIGH (ref 34.8–46.6)
HGB: 15.3 g/dL (ref 11.6–15.9)
LYMPH%: 28.8 % (ref 14.0–49.7)
MCH: 30.7 pg (ref 25.1–34.0)
MCHC: 32.7 g/dL (ref 31.5–36.0)
MCV: 93.9 fL (ref 79.5–101.0)
MONO#: 0.5 10*3/uL (ref 0.1–0.9)
MONO%: 7.9 % (ref 0.0–14.0)
NEUT#: 4.1 10*3/uL (ref 1.5–6.5)
NEUT%: 61.2 % (ref 38.4–76.8)
Platelets: 207 10*3/uL (ref 145–400)
RBC: 5 10*6/uL (ref 3.70–5.45)
RDW: 13.3 % (ref 11.2–14.5)
WBC: 6.7 10*3/uL (ref 3.9–10.3)
lymph#: 1.9 10*3/uL (ref 0.9–3.3)

## 2014-02-16 ENCOUNTER — Telehealth: Payer: Self-pay | Admitting: Oncology

## 2014-02-16 ENCOUNTER — Ambulatory Visit (HOSPITAL_BASED_OUTPATIENT_CLINIC_OR_DEPARTMENT_OTHER): Payer: Medicare Other | Admitting: Oncology

## 2014-02-16 VITALS — BP 132/56 | HR 68 | Temp 97.9°F | Resp 18 | Ht 62.5 in | Wt 220.6 lb

## 2014-02-16 DIAGNOSIS — C439 Malignant melanoma of skin, unspecified: Secondary | ICD-10-CM

## 2014-02-16 NOTE — Telephone Encounter (Signed)
gv pt appt schedule oct/nov 2016. cxr to be doen 02/08/15 same day as f/u.

## 2014-02-16 NOTE — Progress Notes (Signed)
Hematology and Oncology Follow Up Visit  [name] [id number] Oct 02, 1945 68 y.o. 02/16/2014 3:10 PM  CC: Dickey Gave, MD Rudean Hitt, MD Barnett Abu, M.D. Rolm Bookbinder, MD    Principle Diagnosis: This is a 68 year old female with a diagnosis of T2 N0, stage IB superficial spreading melanoma diagnosed July 2010.  Prior Therapy:  She is Status post wide excision followed by sentinel node biposy. Her final Clark's a level was IV with 1.4-mm depth of invasion.  Current therapy: Observation, surveillance.  Interim History: [name] presents today for a followup visit. Since the last visit, she reports new complaints. She underwent a cardiac catheterization and showed no evidence of blockages.She has continued to do very well without any evidence to suggest relapsed or recurrent disease for her melanoma.  She had not reported any symptoms of abdominal distension or pain at this point.  She had not reported any skin rashes or lesions.  Had not had any recent change in her performance status or activity level. No skin lesions noted. She is continue to follow with Dr. Ubaldo Glassing. She does not report any headaches or blurry vision or syncope. She does not report any shortness of breath, leg edema or palpitation. She does report occasional chest pain and anxiety but does have improved recently. She does not report any nausea, vomiting, hematochezia or melena. She does not report any urinary symptoms. Rest of her review of systems unremarkable.  Medications: I have reviewed the patient's current medications.  Current Outpatient Prescriptions  Medication Sig Dispense Refill  . amLODipine (NORVASC) 10 MG tablet Take 10 mg by mouth daily.    Marland Kitchen aspirin 325 MG tablet Take 325 mg by mouth daily.    Marland Kitchen atorvastatin (LIPITOR) 10 MG tablet Take 1 tablet (10 mg total) by mouth daily. 90 tablet 3  . Eflornithine HCl (VANIQA) 13.9 % cream Apply topically 2 (two) times daily with a meal.     . estradiol (ESTRACE) 0.1 MG/GM vaginal cream Place 0.5 Applicatorfuls vaginally 2 (two) times a week.    . estradiol (ESTRACE) 0.5 MG tablet Take 1 tablet (0.5 mg total) by mouth daily. 90 tablet 0  . glucosamine-chondroitin 500-400 MG tablet Take 1 tablet by mouth 2 (two) times daily.    . hydrochlorothiazide (HYDRODIURIL) 25 MG tablet Take 25 mg by mouth daily.    . isosorbide mononitrate (IMDUR) 120 MG 24 hr tablet Take 120 mg by mouth daily.    Marland Kitchen lisinopril (PRINIVIL,ZESTRIL) 10 MG tablet Take 10 mg by mouth daily.    . metoprolol (LOPRESSOR) 100 MG tablet Take 100 mg by mouth 2 (two) times daily.    . Multiple Vitamin (MULTIVITAMIN) tablet Take 1 tablet by mouth daily.    Marland Kitchen NITROSTAT 0.4 MG SL tablet Place 0.4 mg under the tongue every 5 (five) minutes as needed for chest pain.     . potassium chloride SA (K-DUR,KLOR-CON) 20 MEQ tablet Take 1 tablet (20 mEq total) by mouth 2 (two) times daily. 180 tablet 3  . Vitamin D, Ergocalciferol, (DRISDOL) 50000 UNITS CAPS capsule Take 50,000 Units by mouth every 14 (fourteen) days.     No current facility-administered medications for this visit.    Allergies:  Allergies  Allergen Reactions  . Azithromycin   . Morphine And Related   . Other     Horse products  . Penicillins   . Tetanus Toxoids   . Augmentin [Amoxicillin-Pot Clavulanate] Rash    Past Medical History, Surgical  history, Social history, and Family History were reviewed and updated.  Review of Systems:  Remaining ROS negative. Physical Exam: Blood pressure 132/56, pulse 68, temperature 97.9 F (36.6 C), temperature source Oral, resp. rate 18, height 5' 2.5" (1.588 m), weight 220 lb 9.6 oz (100.064 kg), SpO2 97 %. ECOG: 0 General appearance: alert Head: Normocephalic, without obvious abnormality, atraumatic Neck: no adenopathy, no carotid bruit, no JVD, supple, symmetrical, trachea midline and thyroid not enlarged, symmetric, no tenderness/mass/nodules Lymph nodes:  Cervical, supraclavicular, and axillary nodes normal. Heart:regular rate and rhythm, S1, S2 normal, no murmur, click, rub or gallop Lung:chest clear, no wheezing, rales, normal symmetric air entry Abdomin: soft, non-tender, without masses or organomegaly EXT:no erythema, induration, or nodules Skin: no lesions noted on exam today.   Lab Results: Lab Results  Component Value Date   WBC 6.7 02/14/2014   HGB 15.3 02/14/2014   HCT 47.0* 02/14/2014   MCV 93.9 02/14/2014   PLT 207 02/14/2014     Chemistry      Component Value Date/Time   NA 143 02/14/2014 1302   NA 138 01/23/2014 1620   K 3.9 02/14/2014 1302   K 3.8 01/25/2014 1033   CL 101 01/23/2014 1620   CL 103 06/22/2012 1308   CO2 27 02/14/2014 1302   CO2 31 01/23/2014 1620   BUN 10.1 02/14/2014 1302   BUN 9 01/23/2014 1620   CREATININE 0.8 02/14/2014 1302   CREATININE 0.7 01/23/2014 1620      Component Value Date/Time   CALCIUM 9.8 02/14/2014 1302   CALCIUM 9.3 01/23/2014 1620   ALKPHOS 68 02/14/2014 1302   ALKPHOS 66 06/12/2011 1407   AST 25 02/14/2014 1302   AST 30 06/12/2011 1407   ALT 37 02/14/2014 1302   ALT 42* 06/12/2011 1407   BILITOT 1.32* 02/14/2014 1302   BILITOT 1.0 06/12/2011 1407     EXAM: CHEST 2 VIEW  COMPARISON: PA and lateral chest of February 10, 2013  FINDINGS: The lungs are adequately inflated and clear. The heart and mediastinal structures are normal. There is no pleural effusion. There is gentle curvature of the mid upper thoracic spine convex toward the right. There is calcification of the anterior longitudinal ligament of the thoracic spine.  IMPRESSION: There is no active cardiopulmonary disease.   Impression and Plan:  This is a 68 year old female with the following issues: 1. Stage T2 N0, stage IB Clark's level IV with depth of invasion 1.4 mm, status post wide excision.  Her laboratory data and chest x-ray reviewed today after they were obtained on 02/14/2014. No  evidence of any metastatic disease noted at this time.  Plan is to continue with active surveillance, including a physical examination, liver function tests and x-rays on an annual basis.  She will continue to have dermatological examination every 6 months.   2. History of hypertension and hyperlipidemia, followed by primary care physician.  GEXBMW,UXLKG, MD 11/5/20153:10 PM

## 2014-03-09 ENCOUNTER — Telehealth: Payer: Self-pay | Admitting: Interventional Cardiology

## 2014-03-09 NOTE — Telephone Encounter (Signed)
Ok to continue estrogen

## 2014-03-13 NOTE — Telephone Encounter (Signed)
Where did this originate from. Just wanted to have a clue before I called the patient.

## 2014-03-15 NOTE — Telephone Encounter (Signed)
I left a message for the patient to call. 

## 2014-03-15 NOTE — Telephone Encounter (Signed)
I believe on of her other doctors was wondering about the estrogen.  Not sure how I got the initial message.

## 2014-03-23 ENCOUNTER — Telehealth: Payer: Self-pay | Admitting: Interventional Cardiology

## 2014-03-23 ENCOUNTER — Encounter (HOSPITAL_COMMUNITY): Payer: Self-pay | Admitting: Interventional Cardiology

## 2014-03-23 ENCOUNTER — Telehealth: Payer: Self-pay | Admitting: Nurse Practitioner

## 2014-03-23 NOTE — Telephone Encounter (Signed)
Routing to [employer], FNP as Juluis Rainier. Patient has been holding Estrace 0.5 mg daily and Estrace vaginal cream 0.5 applicator twice weekly until after follow up with heart specialist.

## 2014-03-23 NOTE — Telephone Encounter (Signed)
The patient called back today. She states that her insurance is not wanting to cover her estrogen based on her cardiac history. I advised that per Dr. Irish Lack, she is ok to continue this. Per the patient, her RX for estrogen is being handled with Edman Circle- PA at [facility]. I advised I would call the office and let the staff know that she may continue estrogen per Dr. Irish Lack. She is agreeable. The office # is [phone]. Alvis Lemmings, RN, BSN  I left a message for [employer] or the nurse that the patient may continue estrogen per Dr. Irish Lack and to call if any questions. Alvis Lemmings, RN, BSN

## 2014-03-23 NOTE — Telephone Encounter (Signed)
Heather w/Dr Hassell Done office [facility]) states pt ok to take Estrogen based on cardiac history.  Pt was having difficulty getting estrogen covered by insurance because of cardiac history.  Nira Conn states welcome to call her with any questions.  bf

## 2014-03-23 NOTE — Telephone Encounter (Signed)
Previous encounter open. Documented on previous encounter.

## 2014-03-23 NOTE — Telephone Encounter (Signed)
I have tried to call [name] twice myself to let her know that Dr. Quincy Simmonds and I have reviewed her cardiac risk factors and input from cardiologist and we do not feel the hormones are safe for her and need to stop them.

## 2014-03-23 NOTE — Telephone Encounter (Signed)
New Message  Pt returning call from [facility].

## 2014-04-05 NOTE — Telephone Encounter (Signed)
Calling patient to ensure she received message from Milford Cage, [place]. Called home number (only number in records) and female answered phone. No details left, advised calling from [facility] and can return call at her convenience.

## 2014-05-01 NOTE — Telephone Encounter (Signed)
Patty, are you aware if patient was reached and message discussed?

## 2014-05-01 NOTE — Telephone Encounter (Signed)
No I am not aware that she know this information as I could not reach her.

## 2014-05-09 ENCOUNTER — Other Ambulatory Visit: Payer: Self-pay | Admitting: Obstetrics and Gynecology

## 2014-05-10 ENCOUNTER — Other Ambulatory Visit: Payer: Self-pay | Admitting: *Deleted

## 2014-05-10 ENCOUNTER — Telehealth: Payer: Self-pay | Admitting: Nurse Practitioner

## 2014-05-10 NOTE — Telephone Encounter (Signed)
Spoke with patient. Advised patient of previous message from Milford Cage, [place] as seen below. Please see telephone encounter from 03/23/2014. Patient states "I got the okay from [place]. I thought this was handled and I had been given the okay. He was supposed to contact your office. Everyone wants me to be off estrogen and I love being on it. I have really bad night sweats." Advised patient per Milford Cage, FNP and Dr.Silva's review do not feel this is appropriate at this time due to cardiac history. "I just don't understanding. My cardiologist said I could use it. Will you check with Patty and have her check with him?" Advised patient will send message to Milford Cage, [place] for review. Patient is agreeable.  Milford Cage, FNP at 03/23/2014 9:07 PM     Status: Signed       Expand All Collapse All   I have tried to call Artis twice myself to let her know that Dr. Quincy Simmonds and I have reviewed her cardiac risk factors and input from cardiologist and we do not feel the hormones are safe for her and need to stop them.

## 2014-05-10 NOTE — Telephone Encounter (Signed)
Patient needs a preautherization for estrace cream to be covered by her insurance.  [phone], pharmacy on file.

## 2014-05-10 NOTE — Telephone Encounter (Signed)
Medication refill request: Vitamin D 50,000  Last AEX:  09/19/13 with PG Next AEX: 10/05/14 with PG Last Vitamin D level checked: 10/04/2008 at 51 Last MMG (if hormonal medication request): N/A Refill authorized: Please advise.  (Chart In Your Door)

## 2014-05-10 NOTE — Telephone Encounter (Addendum)
See phone note from 05-10-14.  Routing to provider for final review.  Will close encounter

## 2014-05-10 NOTE — Telephone Encounter (Signed)
Medication refill request: Estrace 0.5 mg tab Last AEX:  09/19/13 Next AEX: 10/05/14 Last MMG (if hormonal medication request): 09/27/13 BIRADS2:Benign  Refill authorized: 09/28/13 #90/0R. Today #90/1R?

## 2014-05-11 MED ORDER — VITAMIN D (ERGOCALCIFEROL) 1.25 MG (50000 UNIT) PO CAPS: 50000.0000 [IU] | ORAL_CAPSULE | ORAL | Status: DC

## 2014-05-11 NOTE — Telephone Encounter (Signed)
Dr. Quincy Simmonds and I did review notes from the cardiology and Dr. Quincy Simmonds still feels strongly that ERT needs to be stopped now.  In the past the notes from Dr. Lenda Kelp on 07/2010 states "no hot flashes and wants to stay on ERT to stay young and pretty".  We feel she puts herself at greater risk with staying on ERT.  She may make an apt with MD to discuss further if desired.

## 2014-05-11 NOTE — Telephone Encounter (Signed)
RX request was denied on the refill request.

## 2014-05-11 NOTE — Telephone Encounter (Signed)
Spoke with patient. Advised patient of message as seen below from Milford Cage, [place]. Patient is agreeable. Patient does not wish to make appointment at this time with MD. Will return call if she would like to make an appointment to discuss.  Routing to provider for final review. Patient agreeable to disposition. Will close encounter

## 2014-05-25 ENCOUNTER — Other Ambulatory Visit: Payer: Self-pay

## 2014-05-25 DIAGNOSIS — C439 Malignant melanoma of skin, unspecified: Secondary | ICD-10-CM

## 2014-05-25 MED ORDER — NITROGLYCERIN 0.4 MG SL SUBL: 0.4000 mg | SUBLINGUAL_TABLET | SUBLINGUAL | Status: DC | PRN

## 2014-08-30 ENCOUNTER — Ambulatory Visit (INDEPENDENT_AMBULATORY_CARE_PROVIDER_SITE_OTHER): Payer: Medicare Other | Admitting: Interventional Cardiology

## 2014-08-30 ENCOUNTER — Encounter: Payer: Self-pay | Admitting: Interventional Cardiology

## 2014-08-30 VITALS — BP 124/68 | HR 69 | Ht 62.5 in | Wt 220.0 lb

## 2014-08-30 DIAGNOSIS — I2510 Atherosclerotic heart disease of native coronary artery without angina pectoris: Secondary | ICD-10-CM | POA: Diagnosis not present

## 2014-08-30 DIAGNOSIS — I10 Essential (primary) hypertension: Secondary | ICD-10-CM | POA: Diagnosis not present

## 2014-08-30 DIAGNOSIS — G4710 Hypersomnia, unspecified: Secondary | ICD-10-CM

## 2014-08-30 DIAGNOSIS — R400 Somnolence: Secondary | ICD-10-CM

## 2014-08-30 MED ORDER — ASPIRIN EC 81 MG PO TBEC: 81.0000 mg | DELAYED_RELEASE_TABLET | Freq: Every day | ORAL | Status: DC

## 2014-08-30 NOTE — Progress Notes (Signed)
Patient ID: [name], female   DOB: 30-Nov-1945, 69 y.o.   MRN: [id number]     Cardiology Office Note   Date:  08/30/2014   ID:  [name], DOB November 11, 1945, MRN [id number]  PCP:  Gerrit Heck, MD    No chief complaint on file. sleepiness   Wt Readings from Last 3 Encounters:  08/30/14 220 lb (99.791 kg)  02/16/14 220 lb 9.6 oz (100.064 kg)  02/10/14 220 lb 6.4 oz (99.973 kg)       History of Present Illness: [name] is a 69 y.o. female   who has had CAD. She had PCI in 1999 and 2002.  She had a cath without obstructive idsease in 2015.  No chest pain at this time.  She is not exercising regularly.   Her only complaint is sleeping too much during the day and feeling tired after 10 hours of sleep.  Her husband has not mentioned snoring but she has not asked specifically.    Past Medical History  Diagnosis Date  . Fibrocystic breast changes   . Menorrhagia   . Glucosuria   . GERD (gastroesophageal reflux disease)   . Bone spur   . Melanoma 10/2008    right shoulder and arm  . Hernia, inguinal, left 10/2002    Past Surgical History  Procedure Laterality Date  . Hysteroscopy  2/98    D&C (polyps)  . Total abdominal hysterectomy      LSO     Failed TVH  . Coronary angioplasty with stent placement  10/99  . Angioplasty  2000  . Node dissection      neg  . Appendectomy    . Left heart catheterization with coronary angiogram N/A 01/25/2014    Procedure: LEFT HEART CATHETERIZATION WITH CORONARY ANGIOGRAM;  Surgeon: Jettie Booze, MD;  Location[facility] C[facility] LAB;  Service: Cardiovascular;  Laterality: N/A;     Current Outpatient Prescriptions  Medication Sig Dispense Refill  . amLODipine (NORVASC) 10 MG tablet Take 10 mg by mouth daily.    Marland Kitchen atorvastatin (LIPITOR) 10 MG tablet Take 1 tablet (10 mg total) by mouth daily. 90 tablet 3  . Eflornithine HCl (VANIQA) 13.9 % cream Apply topically 2 (two) times daily with a meal.    . estradiol  (ESTRACE) 0.1 MG/GM vaginal cream Place 0.5 Applicatorfuls vaginally 2 (two) times a week.    . estradiol (ESTRACE) 0.5 MG tablet Take 1 tablet (0.5 mg total) by mouth daily. 90 tablet 0  . glucosamine-chondroitin 500-400 MG tablet Take 1 tablet by mouth 2 (two) times daily.    . hydrochlorothiazide (HYDRODIURIL) 25 MG tablet Take 25 mg by mouth daily.    . isosorbide mononitrate (IMDUR) 120 MG 24 hr tablet Take 120 mg by mouth daily.    Marland Kitchen lisinopril (PRINIVIL,ZESTRIL) 10 MG tablet Take 10 mg by mouth daily.    . metoprolol (LOPRESSOR) 100 MG tablet Take 100 mg by mouth 2 (two) times daily.    . Multiple Vitamin (MULTIVITAMIN) tablet Take 1 tablet by mouth daily.    . nitroGLYCERIN (NITROSTAT) 0.4 MG SL tablet Place 1 tablet (0.4 mg total) under the tongue every 5 (five) minutes as needed for chest pain. 100 tablet 1  . potassium chloride SA (K-DUR,KLOR-CON) 20 MEQ tablet Take 1 tablet (20 mEq total) by mouth 2 (two) times daily. 180 tablet 3  . sertraline (ZOLOFT) 50 MG tablet Take 50 mg by mouth at bedtime.    . Vitamin D,  Ergocalciferol, (DRISDOL) 50000 UNITS CAPS capsule Take 1 capsule (50,000 Units total) by mouth every 14 (fourteen) days. 10 capsule 0   No current facility-administered medications for this visit.    Allergies:   Azithromycin; Other; Penicillins; Tetanus toxoids; Morphine and related; and Augmentin    Social History:  The patient  reports that she has never smoked. She has never used smokeless tobacco. She reports that she drinks about 0.5 oz of alcohol per week. She reports that she does not use illicit drugs.   Family History:  The patient's *family history includes Cancer in her father; Diabetes in her mother; Heart disease in her father; Hypertension in her father and mother; Stroke in her mother.    ROS:  Please see the history of present illness.   Otherwise, review of systems are positive for .   All other systems are reviewed and negative.    PHYSICAL  EXAM: VS:  BP 124/68 mmHg  Pulse 69  Ht 5' 2.5" (1.588 m)  Wt 220 lb (99.791 kg)  BMI 39.57 kg/m2  SpO2 97% , BMI Body mass index is 39.57 kg/(m^2). GEN: Well nourished, well developed, in no acute distress HEENT: normal Neck: no JVD, carotid bruits, or masses Cardiac: RRR; no murmurs, rubs, or gallops,no edema  Respiratory:  clear to auscultation bilaterally, normal work of breathing GI: soft, nontender, nondistended, + BS MS: no deformity or atrophy Skin: warm and dry, no rash Neuro:  Strength and sensation are intact Psych: euthymic mood, full affect   EKG:   The ekg ordered today demonstrates    Recent Labs: 02/14/2014: ALT 37; BUN 10.1; Creatinine 0.8; Hemoglobin 15.3; Platelets 207; Potassium 3.9; Sodium 143   Lipid Panel No results found for: CHOL, TRIG, HDL, CHOLHDL, VLDL, LDLCALC, LDLDIRECT   Other studies Reviewed: Additional studies/ records that were reviewed today with results demonstrating: PCI > 15 years.   ASSESSMENT AND PLAN:  1. CAD: Decrease aspirin dose to 81 mg daily.  Asked if she would want to decrease isosorbide, but she wants to continue the same dose.  2. Sleepiness: ? Sleep apnea.  She is not interested in a sleep study at this time. Labs were checked by Dr. Drema Dallas. I've asked her to check with her husband regarding whether she snores or stops breathing at night while asleep. She will ask him. If she changes her mind, it is okay to order a sleep study.  . 3. HTN: Well controlled. Continue current medicines.  Current medicines are reviewed at length with the patient today.  The patient concerns regarding her medicines were addressed.  The following changes have been made:  decrease aspirin 81 mg a day to reduce bleeding risk   Labs/ tests ordered today include:  No orders of the defined types were placed in this encounter.    Recommend 150 minutes/week of aerobic exercise Low fat, low carb, high fiber diet recommended  Disposition:   FU in 1  year   Teresita Madura., MD  08/30/2014 3:59 PM    [facility] [address], [place], [place]  [address] Phone: [phone]; Fax: [phone]

## 2014-08-30 NOTE — Patient Instructions (Signed)
Medication Instructions:  Your physician recommends that you continue on your current medications as directed. Please refer to the Current Medication list given to you today.   Labwork: NONE  Testing/Procedures: NONE  Follow-Up: Your physician wants you to follow-up in: 12 months with Dr. Irish Lack. You will receive a reminder letter in the mail two months in advance. If you don't receive a letter, please call our office to schedule the follow-up appointment.   Any Other Special Instructions Will Be Listed Below (If Applicable).

## 2014-09-26 ENCOUNTER — Ambulatory Visit: Payer: Medicare Other | Admitting: Nurse Practitioner

## 2014-10-05 ENCOUNTER — Ambulatory Visit: Payer: Medicare Other | Admitting: Nurse Practitioner

## 2015-02-08 ENCOUNTER — Encounter: Payer: Self-pay | Admitting: *Deleted

## 2015-02-08 ENCOUNTER — Other Ambulatory Visit (HOSPITAL_BASED_OUTPATIENT_CLINIC_OR_DEPARTMENT_OTHER): Payer: Medicare Other

## 2015-02-08 DIAGNOSIS — I2510 Atherosclerotic heart disease of native coronary artery without angina pectoris: Secondary | ICD-10-CM

## 2015-02-08 DIAGNOSIS — C439 Malignant melanoma of skin, unspecified: Secondary | ICD-10-CM

## 2015-02-08 LAB — CBC WITH DIFFERENTIAL/PLATELET
BASO%: 0.4 % (ref 0.0–2.0)
Basophils Absolute: 0 10*3/uL (ref 0.0–0.1)
EOS%: 1.6 % (ref 0.0–7.0)
Eosinophils Absolute: 0.1 10*3/uL (ref 0.0–0.5)
HCT: 43.6 % (ref 34.8–46.6)
HGB: 14.8 g/dL (ref 11.6–15.9)
LYMPH%: 30.6 % (ref 14.0–49.7)
MCH: 31.8 pg (ref 25.1–34.0)
MCHC: 33.9 g/dL (ref 31.5–36.0)
MCV: 93.8 fL (ref 79.5–101.0)
MONO#: 0.9 10*3/uL (ref 0.1–0.9)
MONO%: 11.2 % (ref 0.0–14.0)
NEUT#: 4.3 10*3/uL (ref 1.5–6.5)
NEUT%: 56.2 % (ref 38.4–76.8)
Platelets: 228 10*3/uL (ref 145–400)
RBC: 4.65 10*6/uL (ref 3.70–5.45)
RDW: 13.2 % (ref 11.2–14.5)
WBC: 7.7 10*3/uL (ref 3.9–10.3)
lymph#: 2.3 10*3/uL (ref 0.9–3.3)

## 2015-02-08 LAB — COMPREHENSIVE METABOLIC PANEL (CC13)
ALT: 45 U/L (ref 0–55)
AST: 25 U/L (ref 5–34)
Albumin: 4 g/dL (ref 3.5–5.0)
Alkaline Phosphatase: 71 U/L (ref 40–150)
Anion Gap: 8 mEq/L (ref 3–11)
BUN: 11.3 mg/dL (ref 7.0–26.0)
CO2: 28 mEq/L (ref 22–29)
Calcium: 9.9 mg/dL (ref 8.4–10.4)
Chloride: 105 mEq/L (ref 98–109)
Creatinine: 0.7 mg/dL (ref 0.6–1.1)
EGFR: 83 mL/min/{1.73_m2} — ABNORMAL LOW (ref >90)
Glucose: 91 mg/dl (ref 70–140)
Potassium: 3.9 mEq/L (ref 3.5–5.1)
Sodium: 141 mEq/L (ref 136–145)
Total Bilirubin: 0.82 mg/dL (ref 0.20–1.20)
Total Protein: 7 g/dL (ref 6.4–8.3)

## 2015-02-08 LAB — LACTATE DEHYDROGENASE (CC13): LDH: 203 U/L (ref 125–245)

## 2015-02-08 NOTE — Progress Notes (Unsigned)
Patient came in for lab and a cxr, but was turned away by radiology, she was told there was no order for a cxr. New order placed in epic and notified patient to come back to [facility] for cxr, pa and lateral. Note to dr Hazeline Junker desk.

## 2015-02-09 ENCOUNTER — Ambulatory Visit (HOSPITAL_COMMUNITY)
Admission: RE | Admit: 2015-02-09 | Discharge: 2015-02-09 | Disposition: A | Discharge status: Home / Self Care | Payer: Medicare Other | Source: Ambulatory Visit | Attending: Oncology | Admitting: Oncology

## 2015-02-09 DIAGNOSIS — Z85820 Personal history of malignant melanoma of skin: Secondary | ICD-10-CM | POA: Diagnosis not present

## 2015-02-09 DIAGNOSIS — I2510 Atherosclerotic heart disease of native coronary artery without angina pectoris: Secondary | ICD-10-CM | POA: Diagnosis not present

## 2015-02-15 ENCOUNTER — Ambulatory Visit (HOSPITAL_BASED_OUTPATIENT_CLINIC_OR_DEPARTMENT_OTHER): Payer: Medicare Other | Admitting: Oncology

## 2015-02-15 ENCOUNTER — Telehealth: Payer: Self-pay | Admitting: Oncology

## 2015-02-15 VITALS — BP 149/78 | HR 66 | Temp 97.7°F | Resp 19 | Ht 63.5 in | Wt 223.0 lb

## 2015-02-15 DIAGNOSIS — C439 Malignant melanoma of skin, unspecified: Secondary | ICD-10-CM

## 2015-02-15 DIAGNOSIS — Z85820 Personal history of malignant melanoma of skin: Secondary | ICD-10-CM | POA: Diagnosis not present

## 2015-02-15 NOTE — Telephone Encounter (Signed)
gv adn printed avs °

## 2015-02-15 NOTE — Progress Notes (Signed)
Hematology and Oncology Follow Up Visit  [name] [id number] 21-Sep-1945 69 y.o. 02/15/2015 3:16 PM  CC: Dickey Gave, MD Rudean Hitt, MD Barnett Abu, M.D. Rolm Bookbinder, MD    Principle Diagnosis: This is a 69 year old female with a diagnosis of T2 N0, stage IB superficial spreading melanoma diagnosed July 2010.  Prior Therapy:  She is Status post wide excision followed by sentinel node biposy. Her final Clark's a level was IV with 1.4-mm depth of invasion.  Current therapy: Observation, surveillance.  Interim History: [name] presents today for a followup visit. Since the last visit, she continues to do well without any complications. She had not reported any skin rashes or lesions.  Had not had any recent change in her performance status or activity level. She is continue to follow with Dr. Ubaldo Glassing. Her appetite remains within normal range her weight is relatively stable. She follows up with cardiology on a regular basis because of her cardiac history. She also does routine physical examination by her primary care physician.   She does not report any headaches or blurry vision or syncope. She does not report any shortness of breath, leg edema or palpitation. She does report occasional chest pain and anxiety but does have improved recently. She does not report any nausea, vomiting, hematochezia or melena. She does not report any urinary symptoms. Rest of her review of systems unremarkable.  Medications: I have reviewed the patient's current medications.  Current Outpatient Prescriptions  Medication Sig Dispense Refill  . amLODipine (NORVASC) 10 MG tablet Take 10 mg by mouth daily.    Marland Kitchen aspirin EC 81 MG tablet Take 1 tablet (81 mg total) by mouth daily. 90 tablet 3  . atorvastatin (LIPITOR) 10 MG tablet Take 1 tablet (10 mg total) by mouth daily. 90 tablet 3  . Eflornithine HCl (VANIQA) 13.9 % cream Apply topically 2 (two) times daily with a meal.    .  estradiol (ESTRACE) 0.1 MG/GM vaginal cream Place 0.5 Applicatorfuls vaginally 2 (two) times a week.    . estradiol (ESTRACE) 0.5 MG tablet Take 1 tablet (0.5 mg total) by mouth daily. 90 tablet 0  . glucosamine-chondroitin 500-400 MG tablet Take 1 tablet by mouth 2 (two) times daily.    . hydrochlorothiazide (HYDRODIURIL) 25 MG tablet Take 25 mg by mouth daily.    . isosorbide mononitrate (IMDUR) 120 MG 24 hr tablet Take 120 mg by mouth daily.    Marland Kitchen lisinopril (PRINIVIL,ZESTRIL) 10 MG tablet Take 10 mg by mouth daily.    . metoprolol (LOPRESSOR) 100 MG tablet Take 100 mg by mouth 2 (two) times daily.    . Multiple Vitamin (MULTIVITAMIN) tablet Take 1 tablet by mouth daily.    . nitroGLYCERIN (NITROSTAT) 0.4 MG SL tablet Place 1 tablet (0.4 mg total) under the tongue every 5 (five) minutes as needed for chest pain. 100 tablet 1  . potassium chloride SA (K-DUR,KLOR-CON) 20 MEQ tablet Take 1 tablet (20 mEq total) by mouth 2 (two) times daily. 180 tablet 3  . sertraline (ZOLOFT) 50 MG tablet Take 50 mg by mouth at bedtime.    . Vitamin D, Ergocalciferol, (DRISDOL) 50000 UNITS CAPS capsule Take 1 capsule (50,000 Units total) by mouth every 14 (fourteen) days. 10 capsule 0   No current facility-administered medications for this visit.    Allergies:  Allergies  Allergen Reactions  . Azithromycin Swelling  . Other Itching, Swelling and Cough    Horse products  . Penicillins  Swelling  . Tetanus Toxoids Swelling  . Morphine And Related Nausea And Vomiting  . Augmentin [Amoxicillin-Pot Clavulanate] Rash    Past Medical History, Surgical history, Social history, and Family History were reviewed and updated.  Review of Systems:  Remaining ROS negative. Physical Exam: Blood pressure 149/78, pulse 66, temperature 97.7 F (36.5 C), temperature source Oral, resp. rate 19, height 5' 3.5" (1.613 m), weight 223 lb (101.152 kg), SpO2 97 %. ECOG: 0 General appearance: alert, awake woman appeared  without distress. Head: Normocephalic, without obvious abnormality Neck: no adenopathy Lymph nodes: Cervical, supraclavicular, and axillary nodes normal. Heart:regular rate and rhythm, S1, S2 normal, no murmur, click, rub or gallop Lung:chest clear, no wheezing, rales, normal symmetric air entry Abdomin: soft, non-tender, without masses or organomegaly no shifting dullness or ascites. EXT:no erythema, induration, or nodules Skin: no rashes or lesions noted today.  Lab Results: Lab Results  Component Value Date   WBC 7.7 02/08/2015   HGB 14.8 02/08/2015   HCT 43.6 02/08/2015   MCV 93.8 02/08/2015   PLT 228 02/08/2015     Chemistry      Component Value Date/Time   NA 141 02/08/2015 1342   NA 138 01/23/2014 1620   K 3.9 02/08/2015 1342   K 3.8 01/25/2014 1033   CL 101 01/23/2014 1620   CL 103 06/22/2012 1308   CO2 28 02/08/2015 1342   CO2 31 01/23/2014 1620   BUN 11.3 02/08/2015 1342   BUN 9 01/23/2014 1620   CREATININE 0.7 02/08/2015 1342   CREATININE 0.7 01/23/2014 1620      Component Value Date/Time   CALCIUM 9.9 02/08/2015 1342   CALCIUM 9.3 01/23/2014 1620   ALKPHOS 71 02/08/2015 1342   ALKPHOS 66 06/12/2011 1407   AST 25 02/08/2015 1342   AST 30 06/12/2011 1407   ALT 45 02/08/2015 1342   ALT 42* 06/12/2011 1407   BILITOT 0.82 02/08/2015 1342   BILITOT 1.0 06/12/2011 1407      EXAM: CHEST 2 VIEW  COMPARISON: PA and lateral chest x-ray of February 14, 2014  FINDINGS: The lungs are adequately inflated. There is no focal infiltrate. There is no pleural effusion. The heart and pulmonary vascularity are normal. The mediastinum is normal in width. There is calcification of the anterior longitudinal ligament of the thoracic spine.  IMPRESSION: There is no active cardiopulmonary disease   Impression and Plan:  This is a 69 year old female with the following issues: 1. Stage T2 N0, stage IB Clark's level IV with depth of invasion 1.4 mm, status post  wide excision.  Her laboratory data and chest x-ray from October 2016 were reviewed today and showed no evidence of any metastatic disease noted at this time. She is over 6 years now from her diagnosis and treatment and the likelihood of recurrent melanoma systemically is very small. I have recommended continued dermatology follow-up every 6 months and I'll be happy to see her in the future as needed. 2. History of hypertension and hyperlipidemia, followed by primary care physician.  Zola Button, MD 11/3/20163:16 PM

## 2015-09-24 ENCOUNTER — Other Ambulatory Visit: Payer: Self-pay | Admitting: Interventional Cardiology

## 2015-11-13 ENCOUNTER — Encounter: Payer: Self-pay | Admitting: Interventional Cardiology

## 2015-11-13 ENCOUNTER — Ambulatory Visit (INDEPENDENT_AMBULATORY_CARE_PROVIDER_SITE_OTHER): Payer: Medicare Other | Admitting: Interventional Cardiology

## 2015-11-13 VITALS — BP 110/60 | HR 59 | Ht 63.5 in | Wt 214.0 lb

## 2015-11-13 DIAGNOSIS — G4710 Hypersomnia, unspecified: Secondary | ICD-10-CM

## 2015-11-13 DIAGNOSIS — I2510 Atherosclerotic heart disease of native coronary artery without angina pectoris: Secondary | ICD-10-CM

## 2015-11-13 DIAGNOSIS — E782 Mixed hyperlipidemia: Secondary | ICD-10-CM

## 2015-11-13 DIAGNOSIS — I10 Essential (primary) hypertension: Secondary | ICD-10-CM

## 2015-11-13 DIAGNOSIS — R400 Somnolence: Secondary | ICD-10-CM

## 2015-11-13 MED ORDER — METOPROLOL TARTRATE 50 MG PO TABS: 50.0000 mg | ORAL_TABLET | Freq: Two times a day (BID) | ORAL | 3 refills | Status: DC

## 2015-11-13 NOTE — Progress Notes (Signed)
Patient ID: [name], female   DOB: 1945-09-06, 70 y.o.   MRN: [id number]     Cardiology Office Note   Date:  11/13/2015   ID:  [name], DOB 12-Feb-1946, MRN [id number]  PCP:  Gerrit Heck, MD    No chief complaint on file. sleepiness   Wt Readings from Last 3 Encounters:  11/13/15 214 lb (97.1 kg)  02/15/15 223 lb (101.2 kg)  08/30/14 220 lb (99.8 kg)       History of Present Illness: [name] is a 70 y.o. female   who has had CAD. She had PCI in 1999 and 2002.  She had a cath without obstructive idsease in 2015.  No chest pain at this time.  She is not exercising regularly.   Her only complaint is sleeping too much during the day and feeling tired after 10 hours of sleep.  Her husband has not mentioned snoring when asked specifically.  She knows she should walk but she does not do it.      Past Medical History:  Diagnosis Date  . Bone spur   . Fibrocystic breast changes   . GERD (gastroesophageal reflux disease)   . Glucosuria   . Hernia, inguinal, left 10/2002  . Melanoma ([place]) 10/2008   right shoulder and arm  . Menorrhagia     Past Surgical History:  Procedure Laterality Date  . ANGIOPLASTY  2000  . APPENDECTOMY    . CORONARY ANGIOPLASTY WITH STENT PLACEMENT  10/99  . HYSTEROSCOPY  2/98   D&C (polyps)  . LEFT HEART CATHETERIZATION WITH CORONARY ANGIOGRAM N/A 01/25/2014   Procedure: LEFT HEART CATHETERIZATION WITH CORONARY ANGIOGRAM;  Surgeon: Jettie Booze, MD;  Location: [facility] CATH LAB;  Service: Cardiovascular;  Laterality: N/A;  . NODE DISSECTION     neg  . TOTAL ABDOMINAL HYSTERECTOMY     LSO     Failed TVH     Current Outpatient Prescriptions  Medication Sig Dispense Refill  . amLODipine (NORVASC) 10 MG tablet Take 10 mg by mouth daily.    Marland Kitchen aspirin EC 81 MG tablet Take 1 tablet (81 mg total) by mouth daily. 90 tablet 3  . atorvastatin (LIPITOR) 10 MG tablet Take 1 tablet (10 mg total) by mouth daily. 90 tablet 3   . Eflornithine HCl (VANIQA) 13.9 % cream Apply topically 2 (two) times daily with a meal.    . glucosamine-chondroitin 500-400 MG tablet Take 1 tablet by mouth 2 (two) times daily.    . hydrochlorothiazide (HYDRODIURIL) 25 MG tablet Take 25 mg by mouth daily.    . isosorbide mononitrate (IMDUR) 120 MG 24 hr tablet Take 120 mg by mouth daily.    Marland Kitchen lisinopril (PRINIVIL,ZESTRIL) 10 MG tablet Take 10 mg by mouth daily.    . metoprolol (LOPRESSOR) 100 MG tablet Take 100 mg by mouth 2 (two) times daily.    . Multiple Vitamin (MULTIVITAMIN) tablet Take 1 tablet by mouth daily.    . nitroGLYCERIN (NITROSTAT) 0.4 MG SL tablet Place 0.4 mg under the tongue every 5 (five) minutes as needed for chest pain (3 DOSES MAX).    Marland Kitchen potassium chloride SA (K-DUR,KLOR-CON) 20 MEQ tablet Take 1 tablet (20 mEq total) by mouth 2 (two) times daily. 180 tablet 3  . sertraline (ZOLOFT) 50 MG tablet Take 150 mg by mouth at bedtime.    . Vitamin D, Ergocalciferol, (DRISDOL) 50000 UNITS CAPS capsule Take 1 capsule (50,000 Units total) by mouth every 14 (fourteen)  days. 10 capsule 0   No current facility-administered medications for this visit.     Allergies:   Azithromycin; Other; Penicillins; Tetanus toxoids; Morphine and related; and Augmentin [amoxicillin-pot clavulanate]    Social History:  The patient  reports that she has never smoked. She has never used smokeless tobacco. She reports that she drinks about 0.5 oz of alcohol per week . She reports that she does not use drugs.   Family History:  The patient's *family history includes Cancer in her father; Diabetes in her mother; Heart attack in her father; Heart disease in her father; Hypertension in her father and mother; Stroke in her mother.    ROS:  Please see the history of present illness.   Otherwise, review of systems are positive for .   All other systems are reviewed and negative.    PHYSICAL EXAM: VS:  BP 110/60   Pulse (!) 59   Ht 5' 3.5" (1.613 m)    Wt 214 lb (97.1 kg)   BMI 37.31 kg/m  , BMI Body mass index is 37.31 kg/m. GEN: Well nourished, well developed, in no acute distress  HEENT: normal  Neck: no JVD, carotid bruits, or masses Cardiac: RRR; no murmurs, rubs, or gallops,no edema  Respiratory:  clear to auscultation bilaterally, normal work of breathing GI: soft, nontender, nondistended, + BS MS: no deformity or atrophy  Skin: warm and dry, no rash Neuro:  Strength and sensation are intact Psych: euthymic mood, full affect   EKG:   The ekg ordered today demonstrates SB, IRBBB, no ST segment changes   Recent Labs: 02/08/2015: ALT 45; BUN 11.3; Creatinine 0.7; HGB 14.8; Platelets 228; Potassium 3.9; Sodium 141   Lipid Panel No results found for: CHOL, TRIG, HDL, CHOLHDL, VLDL, LDLCALC, LDLDIRECT   Other studies Reviewed: Additional studies/ records that were reviewed today with results demonstrating: PCI > 15 years.   ASSESSMENT AND PLAN:  1. CAD: Decrease aspirin dose to 81 mg daily.  She wants to decrease meds.  Given bradycardia, decrease metoprolol to 50 mg BID. If after a few weeks, she is feeling well with normal BP, can decrease isosorbide to 60 mg daily.   2. Sleepiness: ? Sleep apnea.  She is not interested in a sleep study at this time. Labs were checked by Dr. Drema Dallas. Her husband did not notice that she snores or stops breathing at night while asleep.   3. HTN: Well controlled. Continue current medicines.  Highest reading at home is in the 140 range. 4. Hyperlipidemia: COntinue atorvastatin.   Current medicines are reviewed at length with the patient today.  The patient concerns regarding her medicines were addressed.  The following changes have been made:  decrease aspirin 81 mg a day to reduce bleeding risk   Labs/ tests ordered today include:  No orders of the defined types were placed in this encounter.   Recommend 150 minutes/week of aerobic exercise Low fat, low carb, high fiber diet  recommended  Disposition:   FU in 1 year   Signed, Larae Grooms, MD  11/13/2015 3:15 PM    [facility] [address], [place], [place]  [address] Phone: [phone]; Fax: [phone]

## 2015-11-13 NOTE — Patient Instructions (Signed)
Medication Instructions:  Decrease Metoprolol to 50 mg twice daily-All other medications remain the same  Labwork: None  Testing/Procedures: None  Follow-Up: Your physician wants you to follow-up in: 1 year. You will receive a reminder letter in the mail two months in advance. If you don't receive a letter, please call our office to schedule the follow-up appointment.   Any Other Special Instructions Will Be Listed Below (If Applicable). Please call Jeani Hawking (Dr Hassell Done nurse) at [phone] in 2 weeks to report how you feel.    If you need a refill on your cardiac medications before your next appointment, please call your pharmacy.

## 2015-12-10 ENCOUNTER — Telehealth: Payer: Self-pay | Admitting: Interventional Cardiology

## 2015-12-10 NOTE — Telephone Encounter (Signed)
Will forward to Dr Irish Lack as Juluis Rainier.

## 2015-12-10 NOTE — Telephone Encounter (Signed)
New message        Pt c/o medication issue:  1. Name of Medication: metoprolol 2. How are you currently taking this medication (dosage and times per day)? 50mg  bid 3. Are you having a reaction (difficulty breathing--STAT)? no 4. What is your medication issue? Calling to let the doctor know that this new dosage is working great.  You do not need to call pt back

## 2016-02-10 ENCOUNTER — Other Ambulatory Visit: Payer: Self-pay | Admitting: Interventional Cardiology

## 2016-10-06 ENCOUNTER — Encounter: Payer: Medicare Other | Admitting: Family Medicine

## 2016-10-06 ENCOUNTER — Ambulatory Visit (INDEPENDENT_AMBULATORY_CARE_PROVIDER_SITE_OTHER): Payer: Medicare Other | Admitting: Family Medicine

## 2016-10-06 VITALS — BP 148/80 | Ht 63.0 in | Wt 214.0 lb

## 2016-10-06 DIAGNOSIS — M79672 Pain in left foot: Secondary | ICD-10-CM

## 2016-10-06 DIAGNOSIS — M79671 Pain in right foot: Secondary | ICD-10-CM | POA: Diagnosis not present

## 2016-10-07 DIAGNOSIS — M79671 Pain in right foot: Secondary | ICD-10-CM | POA: Insufficient documentation

## 2016-10-07 DIAGNOSIS — M79672 Pain in left foot: Principal | ICD-10-CM

## 2016-10-07 NOTE — Assessment & Plan Note (Signed)
Possible that she has a component of metatarsalgia related to her structural changes of her foot. - Custom orthotics today - She had improvement of her pain with the orthotics. She still has some pronation and may benefit from a medial to lateral wedge in the future  - Follow-up as needed  Patient was fitted for a standard, cushioned, semi-rigid orthotic. The orthotic was heated and afterward the patient stood on the orthotic blank positioned on the orthotic stand. The patient was positioned in subtalar neutral position and 10 degrees of ankle dorsiflexion in a weight bearing stance. After completion of molding, a stable base was applied to the orthotic blank. The blank was ground to a stable position for weight bearing. Size: 8 Base: Blue EVA Additional Posting and Padding: None The patient ambulated these, and they were very comfortable.  I spent 45 minutes with this patient, greater than 50% was face-to-face time counseling regarding the below diagnosis.

## 2016-10-07 NOTE — Progress Notes (Signed)
  [name] - 71 y.o. female MRN [id number]  Date of birth: [birth year]  SUBJECTIVE:  Including CC & ROS.   [name] is a 71 year old female that is presenting with bilateral foot pain. The pain is occurring on the forefoot of each foot. The pain started after she was walking at the beach and sandals. His been occurring for several months now. She denies any injury or surgery to her feet. She reports buying over-the-counter orthotics. She is not currently taking any oral anti-inflammatory medications. Has had [place] to help with the pain.  ROS: No unexpected weight loss, fever, chills, swelling, instability, muscle pain, numbness/tingling, redness, otherwise see HPI   HISTORY: Past Medical, Surgical, Social, and Family History Reviewed & Updated per EMR.   Pertinent Historical Findings include: PMHx: CAD, arthritis Surgical:   Appendectomy, hysterectomy, foot fracture, cholecystectomy Social:  No tobacco use, occasional alcohol use FHx: Diabetes, heart disease  DATA REVIEWED: 08/12/16 x-rays taken at [facility]: No significant degenerative changes  PHYSICAL EXAM:  VS: BP (!) 148/80   Ht 5\' 3"  (1.6 m)   Wt 214 lb (97.1 kg)   BMI 37.91 kg/m  PHYSICAL EXAM: Gen: NAD, alert, cooperative with exam, well-appearing HEENT: clear conjunctiva, EOMI CV:  no edema, capillary refill brisk,  Resp: non-labored, normal speech Skin: no rashes, normal turgor  Neuro: no gross deficits.  Psych:  alert and oriented MSK: Bilateral feet: Obvious loss of her transverse arch and longitudinal arch. Some tenderness to palpation of the metatarsal heads. Callus formation over the forefoot and each foot. Normal range of motion. Normal strength. Negative anterior drawer. Negative talar tilt. Neurovascularly intact.  ASSESSMENT & PLAN:   Pain in both feet Possible that she has a component of metatarsalgia related to her structural changes of her foot. - Custom orthotics today - She had  improvement of her pain with the orthotics. She still has some pronation and may benefit from a medial to lateral wedge in the future  - Follow-up as needed  Patient was fitted for a standard, cushioned, semi-rigid orthotic. The orthotic was heated and afterward the patient stood on the orthotic blank positioned on the orthotic stand. The patient was positioned in subtalar neutral position and 10 degrees of ankle dorsiflexion in a weight bearing stance. After completion of molding, a stable base was applied to the orthotic blank. The blank was ground to a stable position for weight bearing. Size: 8 Base: Blue EVA Additional Posting and Padding: None The patient ambulated these, and they were very comfortable.  I spent 45 minutes with this patient, greater than 50% was face-to-face time counseling regarding the below diagnosis.

## 2016-11-27 ENCOUNTER — Encounter (INDEPENDENT_AMBULATORY_CARE_PROVIDER_SITE_OTHER): Payer: Self-pay

## 2016-11-27 ENCOUNTER — Ambulatory Visit (INDEPENDENT_AMBULATORY_CARE_PROVIDER_SITE_OTHER): Payer: Medicare Other | Admitting: Interventional Cardiology

## 2016-11-27 ENCOUNTER — Encounter: Payer: Self-pay | Admitting: Interventional Cardiology

## 2016-11-27 VITALS — BP 132/86 | HR 71 | Ht 63.0 in | Wt 219.4 lb

## 2016-11-27 DIAGNOSIS — I25118 Atherosclerotic heart disease of native coronary artery with other forms of angina pectoris: Secondary | ICD-10-CM

## 2016-11-27 DIAGNOSIS — E782 Mixed hyperlipidemia: Secondary | ICD-10-CM | POA: Diagnosis not present

## 2016-11-27 DIAGNOSIS — I10 Essential (primary) hypertension: Secondary | ICD-10-CM | POA: Diagnosis not present

## 2016-11-27 MED ORDER — ISOSORBIDE MONONITRATE ER 60 MG PO TB24: 60.0000 mg | ORAL_TABLET | Freq: Every day | ORAL | 3 refills | Status: DC

## 2016-11-27 NOTE — Patient Instructions (Signed)
Medication Instructions:  Your physician has recommended you make the following change in your medication:   DECREASE imdur to 60 mg daily   Labwork: None ordered  Testing/Procedures: None ordered  Follow-Up: Your physician wants you to follow-up in: 1 year with Dr. Irish Lack. You will receive a reminder letter in the mail two months in advance. If you don't receive a letter, please call our office to schedule the follow-up appointment.   Any Other Special Instructions Will Be Listed Below (If Applicable).     If you need a refill on your cardiac medications before your next appointment, please call your pharmacy.

## 2016-11-27 NOTE — Progress Notes (Signed)
Cardiology Office Note   Date:  11/27/2016   ID:  Rebecca, Tran 01/14/1946, MRN [id number]  PCP:  Leighton Ruff, MD    No chief complaint on file.  CAD  Wt Readings from Last 3 Encounters:  11/27/16 219 lb 6.4 oz (99.5 kg)  10/06/16 214 lb (97.1 kg)  11/13/15 214 lb (97.1 kg)       History of Present Illness: [name] is a 71 y.o. female   who has had CAD. She had PCI in 1999 and 2002.  She had a cath without obstructive idsease in 2015.  She has occasional chest discomfort.  SHe uses NTG very rarely.  Not related to activity and not like what she had before PCI.  It can happen early in the morning.  She does not walk much.  Denies :  Dizziness.   Orthopnea. Palpitations. Paroxysmal nocturnal dyspnea. Shortness of breath. Syncope.   Overall, she feels well.   Past Medical History:  Diagnosis Date  . Bone spur   . Fibrocystic breast changes   . GERD (gastroesophageal reflux disease)   . Glucosuria   . Hernia, inguinal, left 10/2002  . Melanoma ([place]) 10/2008   right shoulder and arm  . Menorrhagia     Past Surgical History:  Procedure Laterality Date  . ANGIOPLASTY  2000  . APPENDECTOMY    . CORONARY ANGIOPLASTY WITH STENT PLACEMENT  10/99  . HYSTEROSCOPY  2/98   D&C (polyps)  . LEFT HEART CATHETERIZATION WITH CORONARY ANGIOGRAM N/A 01/25/2014   Procedure: LEFT HEART CATHETERIZATION WITH CORONARY ANGIOGRAM;  Surgeon: Jettie Booze, MD;  Location: [facility] CATH LAB;  Service: Cardiovascular;  Laterality: N/A;  . NODE DISSECTION     neg  . TOTAL ABDOMINAL HYSTERECTOMY     LSO     Failed TVH     Current Outpatient Prescriptions  Medication Sig Dispense Refill  . amLODipine (NORVASC) 10 MG tablet Take 10 mg by mouth daily.    Marland Kitchen aspirin EC 81 MG tablet Take 1 tablet (81 mg total) by mouth daily. 90 tablet 3  . atorvastatin (LIPITOR) 10 MG tablet Take 1 tablet (10 mg total) by mouth daily. 90 tablet 3  . glucosamine-chondroitin 500-400 MG  tablet Take 1 tablet by mouth 2 (two) times daily.    . hydrochlorothiazide (HYDRODIURIL) 25 MG tablet Take 25 mg by mouth daily.    . isosorbide mononitrate (IMDUR) 120 MG 24 hr tablet Take 120 mg by mouth daily.    Marland Kitchen lisinopril (PRINIVIL,ZESTRIL) 10 MG tablet Take 10 mg by mouth daily.    . Multiple Vitamin (MULTIVITAMIN) tablet Take 1 tablet by mouth daily.    . nitroGLYCERIN (NITROSTAT) 0.4 MG SL tablet DISSOLVE ONE TABLET UNDER THE TONGUE EVERY 5 MINUTES AS NEEDED FOR CHEST PAIN. 75 tablet 1  . potassium chloride SA (K-DUR,KLOR-CON) 20 MEQ tablet Take 1 tablet (20 mEq total) by mouth 2 (two) times daily. 180 tablet 3  . sertraline (ZOLOFT) 50 MG tablet Take 150 mg by mouth at bedtime.    . Vitamin D, Ergocalciferol, (DRISDOL) 50000 UNITS CAPS capsule Take 1 capsule (50,000 Units total) by mouth every 14 (fourteen) days. 10 capsule 0  . metoprolol (LOPRESSOR) 50 MG tablet Take 1 tablet (50 mg total) by mouth 2 (two) times daily. Decrease in dose 180 tablet 3   No current facility-administered medications for this visit.     Allergies:   Azithromycin; Other; Penicillins; Tetanus toxoids; Morphine and related;  and Augmentin [amoxicillin-pot clavulanate]    Social History:  The patient  reports that she has never smoked. She has never used smokeless tobacco. She reports that she drinks about 0.5 oz of alcohol per week . She reports that she does not use drugs.   Family History:  The patient's family history includes Cancer in her father; Diabetes in her mother; Heart attack in her father; Heart disease in her father; Hypertension in her father and mother; Stroke in her mother.    ROS:  Please see the history of present illness.   Otherwise, review of systems are positive for occasional chest pain.   All other systems are reviewed and negative.    PHYSICAL EXAM: VS:  BP 132/86   Pulse 71   Ht 5\' 3"  (1.6 m)   Wt 219 lb 6.4 oz (99.5 kg)   SpO2 92%   BMI 38.86 kg/m  , BMI Body mass index  is 38.86 kg/m. GEN: Well nourished, well developed, in no acute distress  HEENT: normal  Neck: no JVD, carotid bruits, or masses Cardiac: RRR; no murmurs, rubs, or gallops,no edema  Respiratory:  clear to auscultation bilaterally, normal work of breathing GI: soft, nontender, nondistended, + BS MS: no deformity or atrophy  Skin: warm and dry, no rash Neuro:  Strength and sensation are intact Psych: euthymic mood, full affect   EKG:   The ekg ordered today demonstrates NSR, IRBBB, no ST changes   Recent Labs: No results found for requested labs within last 8760 hours.   Lipid Panel No results found for: CHOL, TRIG, HDL, CHOLHDL, VLDL, LDLCALC, LDLDIRECT   Other studies Reviewed: Additional studies/ records that were reviewed today with results demonstrating: prior PCI results reviewed.   ASSESSMENT AND PLAN:  1. CAD: Anginal sx are infrequent, different from prior angina.  Continue aggressive secondary prevention.  OK to decrease isosorbide to 60 mg daily.  If she has more chest pain, then would have to go back to higher dose.  She wants to trty a lower dose.   2. Hyperlipidemia: LDL 52 in 4/18. COntinue current therapy.  3. HTN: COntrolled.  Continue current meds.  4. I stressed the importance of more exercise to her for her overall wellbeing.   Current medicines are reviewed at length with the patient today.  The patient concerns regarding her medicines were addressed.  The following changes have been made:  No change  Labs/ tests ordered today include:  No orders of the defined types were placed in this encounter.   Recommend 150 minutes/week of aerobic exercise Low fat, low carb, high fiber diet recommended  Disposition:   FU in 1 year   Signed, Larae Grooms, MD  11/27/2016 3:54 PM    [facility] [place], [place],   [address] Phone: [phone]; Fax: [phone]

## 2017-01-20 ENCOUNTER — Ambulatory Visit: Payer: Medicare Other | Admitting: Interventional Cardiology

## 2017-01-28 ENCOUNTER — Ambulatory Visit: Payer: Medicare Other | Admitting: Interventional Cardiology

## 2017-02-18 ENCOUNTER — Other Ambulatory Visit: Payer: Self-pay | Admitting: Gastroenterology

## 2017-02-25 ENCOUNTER — Encounter (HOSPITAL_COMMUNITY): Payer: Self-pay | Admitting: *Deleted

## 2017-02-25 ENCOUNTER — Other Ambulatory Visit: Payer: Self-pay

## 2017-02-25 NOTE — Progress Notes (Signed)
Patient with current cough which developed over weekend per patient.  Instructed patient to call PCP and be evaluated prior to procedure on 03/03/17.  Patient voiced understanding.

## 2017-03-03 ENCOUNTER — Ambulatory Visit (HOSPITAL_COMMUNITY)
Admission: RE | Admit: 2017-03-03 | Discharge: 2017-03-03 | Disposition: A | Discharge status: Home / Self Care | Payer: Medicare Other | Source: Ambulatory Visit | Attending: Gastroenterology | Admitting: Gastroenterology

## 2017-03-03 ENCOUNTER — Ambulatory Visit (HOSPITAL_COMMUNITY): Payer: Medicare Other | Admitting: Anesthesiology

## 2017-03-03 ENCOUNTER — Other Ambulatory Visit: Payer: Self-pay

## 2017-03-03 ENCOUNTER — Encounter (HOSPITAL_COMMUNITY)
Admission: RE | Disposition: A | Discharge status: Home / Self Care | Payer: Self-pay | Source: Ambulatory Visit | Attending: Gastroenterology

## 2017-03-03 ENCOUNTER — Encounter (HOSPITAL_COMMUNITY): Payer: Self-pay

## 2017-03-03 DIAGNOSIS — Z1211 Encounter for screening for malignant neoplasm of colon: Secondary | ICD-10-CM | POA: Insufficient documentation

## 2017-03-03 DIAGNOSIS — K5730 Diverticulosis of large intestine without perforation or abscess without bleeding: Secondary | ICD-10-CM | POA: Diagnosis not present

## 2017-03-03 DIAGNOSIS — I252 Old myocardial infarction: Secondary | ICD-10-CM | POA: Insufficient documentation

## 2017-03-03 DIAGNOSIS — F418 Other specified anxiety disorders: Secondary | ICD-10-CM | POA: Diagnosis not present

## 2017-03-03 DIAGNOSIS — D126 Benign neoplasm of colon, unspecified: Secondary | ICD-10-CM | POA: Diagnosis not present

## 2017-03-03 DIAGNOSIS — I2510 Atherosclerotic heart disease of native coronary artery without angina pectoris: Secondary | ICD-10-CM | POA: Insufficient documentation

## 2017-03-03 DIAGNOSIS — K219 Gastro-esophageal reflux disease without esophagitis: Secondary | ICD-10-CM | POA: Diagnosis not present

## 2017-03-03 DIAGNOSIS — I10 Essential (primary) hypertension: Secondary | ICD-10-CM | POA: Diagnosis not present

## 2017-03-03 DIAGNOSIS — Z955 Presence of coronary angioplasty implant and graft: Secondary | ICD-10-CM | POA: Insufficient documentation

## 2017-03-03 DIAGNOSIS — R1310 Dysphagia, unspecified: Secondary | ICD-10-CM | POA: Diagnosis not present

## 2017-03-03 DIAGNOSIS — K209 Esophagitis, unspecified: Secondary | ICD-10-CM | POA: Diagnosis not present

## 2017-03-03 HISTORY — DX: Unspecified osteoarthritis, unspecified site: M19.90

## 2017-03-03 HISTORY — PX: COLONOSCOPY WITH PROPOFOL: SHX5780

## 2017-03-03 HISTORY — DX: Depression, unspecified: F32.A

## 2017-03-03 HISTORY — DX: Atherosclerotic heart disease of native coronary artery without angina pectoris: I25.10

## 2017-03-03 HISTORY — DX: Acute myocardial infarction, unspecified: I21.9

## 2017-03-03 HISTORY — DX: Anxiety disorder, unspecified: F41.9

## 2017-03-03 HISTORY — PX: ESOPHAGOGASTRODUODENOSCOPY (EGD) WITH PROPOFOL: SHX5813

## 2017-03-03 HISTORY — DX: Essential (primary) hypertension: I10

## 2017-03-03 HISTORY — DX: Major depressive disorder, single episode, unspecified: F32.9

## 2017-03-03 SURGERY — ESOPHAGOGASTRODUODENOSCOPY (EGD) WITH PROPOFOL
Anesthesia: Monitor Anesthesia Care

## 2017-03-03 MED ORDER — EPHEDRINE SULFATE 50 MG/ML IJ SOLN
INTRAMUSCULAR | Status: DC | PRN
  Administered 2017-03-03: 5 mg via INTRAVENOUS

## 2017-03-03 MED ORDER — PROPOFOL 500 MG/50ML IV EMUL
INTRAVENOUS | Status: DC | PRN
  Administered 2017-03-03: 75 ug/kg/min via INTRAVENOUS

## 2017-03-03 MED ORDER — EPHEDRINE 5 MG/ML INJ
INTRAVENOUS | Status: AC
  Filled 2017-03-03: qty 10

## 2017-03-03 MED ORDER — ONDANSETRON HCL 4 MG/2ML IJ SOLN
INTRAMUSCULAR | Status: AC
  Filled 2017-03-03: qty 4

## 2017-03-03 MED ORDER — LACTATED RINGERS IV SOLN
INTRAVENOUS | Status: DC | PRN
  Administered 2017-03-03: 07:00:00 via INTRAVENOUS

## 2017-03-03 MED ORDER — PROPOFOL 500 MG/50ML IV EMUL
INTRAVENOUS | Status: DC | PRN
  Administered 2017-03-03: 50 mg via INTRAVENOUS

## 2017-03-03 MED ORDER — SODIUM CHLORIDE 0.9 % IV SOLN: INTRAVENOUS | Status: DC

## 2017-03-03 MED ORDER — LACTATED RINGERS IV SOLN
INTRAVENOUS | Status: DC
Start: 2017-03-03 — End: 2017-03-03
  Administered 2017-03-03: 07:00:00 via INTRAVENOUS

## 2017-03-03 MED ORDER — ONDANSETRON HCL 4 MG/2ML IJ SOLN
INTRAMUSCULAR | Status: DC | PRN
  Administered 2017-03-03: 4 mg via INTRAVENOUS

## 2017-03-03 MED ORDER — PROPOFOL 10 MG/ML IV BOLUS
INTRAVENOUS | Status: AC
  Filled 2017-03-03: qty 40

## 2017-03-03 SURGICAL SUPPLY — 24 items

## 2017-03-03 NOTE — Op Note (Addendum)
[facility] Patient Name: [name] Procedure Date: 03/03/2017 MRN: [id number] Attending MD: Juanita Craver , MD Date of Birth: 1945-04-24 CSN: [id number] Age: 71 Admit Type: Outpatient Procedure:                Colonoscopy with cold snare polypectomy x 3. Indications:              CRC screening for colorectal malignant neoplasm. Providers:                Juanita Craver, MD, Cleda Daub, RN, Elspeth Cho                            Tech., Technician, Arnoldo Hooker, CRNA Referring MD:             Rudean Hitt MD Medicines:                Monitored Anesthesia Care Complications:            No immediate complications. Estimated Blood Loss:     Estimated blood loss: minimal. Procedure:                Pre-anesthesia assessment:- Prior to the procedure,                            a history and physical was performed, and patient                            medications and allergies were reviewed. The                            patient's tolerance of previous anesthesia was also                            reviewed. The risks and benefits of the procedure                            and the sedation options and risks were discussed                            with the patient. All questions were answered, and                            informed consent was obtained. Prior                            Anticoagulants: The patient has taken aspirin, last                            dose was 5 days prior to procedure. ASA Grade                            Assessment: III - A patient with severe systemic                            disease. After reviewing the risks and benefits,  the patient was deemed in satisfactory condition to                            undergo the procedure. After obtaining informed                            consent, the colonoscope was passed under direct                            vision. Throughout the procedure, the patient's                             blood pressure, pulse, and oxygen saturations were                            monitored continuously. The [id number] ([id number])                            scope was introduced through the anus and advanced                            to the the terminal ileum, with identification of                            the appendiceal orifice and IC valve. The                            colonoscopy was performed with moderate difficulty                            due to inadequate bowel prep. Successful completion                            of the procedure was aided by lavage. The patient                            tolerated the procedure well. The quality of the                            bowel preparation was adequate. The terminal ileum,                            the ileocecal valve, the appendiceal orifice and                            the rectum were photographed. The bowel preparation                            used was GoLYTELY. Scope In: 7:46:26 AM Scope Out: 8:05:27 AM Scope Withdrawal Time: 0 hours 12 minutes 9 seconds  Total Procedure Duration: 0 hours 19 minutes 1 second  Findings:      Multiple large-mouthed diverticula were found in the sigmoid colon.      Three 6-7 mm  sessile polyps were found, 2 in the sigmoid colon and 1 in       the mid-ascending colon; these polyps were removed with a cold snare x       3; resection and retrieval were complete.      The terminal ileum appeared normal.      The exam was otherwise without abnormality on direct and retroflexion       views. Impression:               - Diverticulosis in the sigmoid colon.                           - Three 6-7 mm sessile polyps, 2 in the sigmoid                            colon and 1 in the mid ascending colon, removed                            with a cold snare x 3; resected and retrieved.                           - The examined portion of the ileum was normal.                           -  The examination was otherwise normal on direct                            and retroflexion views. Moderate Sedation:      MAC used. Recommendation:           - High fiber diet with augmented water consumption                            daily.                           - Continue present medications.                           - Await pathology results.                           - No Ibuprofen, Naproxen, or other non-steroidal                            anti-inflammatory drugs for 2 weeks after polyp                            removal.                           - Repeat colonoscopy in 5-10 years for surveillance.                           - Return to GI office in 2 weeks.                           -  If the patient has any abnormal GI symptoms in                            the interim, she has been advised to call the                            office ASAP for further recommendations. Procedure Code(s):        --- Professional ---                           [phone], Colonoscopy, flexible; with removal of                            tumor(s), polyp(s), or other lesion(s) by snare                            technique Diagnosis Code(s):        --- Professional ---                           D12.2, Benign neoplasm of ascending colon                           D12.5, Benign neoplasm of sigmoid colon                           Z12.11, Encounter for screening for malignant                            neoplasm of colon                           K57.30, Diverticulosis of large intestine without                            perforation or abscess without bleeding CPT copyright 2016 American Medical Association. All rights reserved. The codes documented in this report are preliminary and upon coder review may  be revised to meet current compliance requirements. Juanita Craver, MD Juanita Craver, MD 03/03/2017 8:26:15 AM This report has been signed electronically. Number of Addenda: 0

## 2017-03-03 NOTE — Anesthesia Preprocedure Evaluation (Signed)
Anesthesia Evaluation  Patient identified by MRN, date of birth, ID band Patient awake    Reviewed: Allergy & Precautions, NPO status , Patient's Chart, lab work & pertinent test results  Airway Mallampati: I  TM Distance: >3 FB Neck ROM: Full    Dental   Pulmonary    Pulmonary exam normal        Cardiovascular hypertension, Pt. on medications + CAD, + Past MI and + Cardiac Stents  Normal cardiovascular exam     Neuro/Psych Anxiety Depression    GI/Hepatic GERD  Medicated and Controlled,  Endo/Other    Renal/GU      Musculoskeletal   Abdominal   Peds  Hematology   Anesthesia Other Findings   Reproductive/Obstetrics                             Anesthesia Physical Anesthesia Plan  ASA: III  Anesthesia Plan: MAC   Post-op Pain Management:    Induction: Intravenous  PONV Risk Score and Plan: 3 and Treatment may vary due to age or medical condition and Ondansetron  Airway Management Planned: Simple Face Mask  Additional Equipment:   Intra-op Plan:   Post-operative Plan:   Informed Consent: I have reviewed the patients History and Physical, chart, labs and discussed the procedure including the risks, benefits and alternatives for the proposed anesthesia with the patient or authorized representative who has indicated his/her understanding and acceptance.     Plan Discussed with: CRNA and Surgeon  Anesthesia Plan Comments:         Anesthesia Quick Evaluation

## 2017-03-03 NOTE — Anesthesia Postprocedure Evaluation (Signed)
Anesthesia Post Note  Patient: [name]  Procedure(s) Performed: ESOPHAGOGASTRODUODENOSCOPY (EGD) WITH PROPOFOL (N/A ) COLONOSCOPY WITH PROPOFOL (N/A )     Patient location during evaluation: PACU Anesthesia Type: MAC Level of consciousness: awake and alert Pain management: pain level controlled Vital Signs Assessment: post-procedure vital signs reviewed and stable Respiratory status: spontaneous breathing, nonlabored ventilation, respiratory function stable and patient connected to nasal cannula oxygen Cardiovascular status: stable and blood pressure returned to baseline Postop Assessment: no apparent nausea or vomiting Anesthetic complications: no    Last Vitals:  Vitals:   03/03/17 0830 03/03/17 0837  BP: 110/73   Pulse: 61 63  Resp: (!) 21 14  Temp:    SpO2: 95% 95%    Last Pain:  Vitals:   03/03/17 0814  TempSrc: Oral                 OSSEY,KEVIN DAVID

## 2017-03-03 NOTE — Discharge Instructions (Signed)
Upper Endoscopy, Care After  Refer to this sheet in the next few weeks. These instructions provide you with information about caring for yourself after your procedure. Your health care provider may also give you more specific instructions. Your treatment has been planned according to current medical practices, but problems sometimes occur. Call your health care provider if you have any problems or questions after your procedure.  What can I expect after the procedure?  After the procedure, it is common to have:  · A sore throat.  · Bloating.  · Nausea.    Follow these instructions at home:  · Follow instructions from your health care provider about what to eat or drink after your procedure.  · Return to your normal activities as told by your health care provider. Ask your health care provider what activities are safe for you.  · Take over-the-counter and prescription medicines only as told by your health care provider.  · Do not drive for 24 hours if you received a sedative.  · Keep all follow-up visits as told by your health care provider. This is important.  Contact a health care provider if:  · You have a sore throat that lasts longer than one day.  · You have trouble swallowing.  Get help right away if:  · You have a fever.  · You vomit blood or your vomit looks like coffee grounds.  · You have bloody, black, or tarry stools.  · You have a severe sore throat or you cannot swallow.  · You have difficulty breathing.  · You have severe pain in your chest or belly.  This information is not intended to replace advice given to you by your health care provider. Make sure you discuss any questions you have with your health care provider.  Document Released: 09/30/2011 Document Revised: 09/06/2015 Document Reviewed: 01/11/2015  Elsevier Interactive Patient Education © 2017 [employer]  Colonoscopy, Adult, Care After  This sheet gives you information about how to care for yourself after your procedure. Your doctor may  also give you more specific instructions. If you have problems or questions, call your doctor.  Follow these instructions at home:  General instructions     · For the first 24 hours after the procedure:  ? Do not drive or use machinery.  ? Do not sign important documents.  ? Do not drink alcohol.  ? Do your daily activities more slowly than normal.  ? Eat foods that are soft and easy to digest.  ? Rest often.  · Take over-the-counter or prescription medicines only as told by your doctor.  · It is up to you to get the results of your procedure. Ask your doctor, or the department performing the procedure, when your results will be ready.  To help cramping and bloating:   · Try walking around.  · Put heat on your belly (abdomen) as told by your doctor. Use a heat source that your doctor recommends, such as a moist heat pack or a heating pad.  ? Put a towel between your skin and the heat source.  ? Leave the heat on for 20-30 minutes.  ? Remove the heat if your skin turns bright red. This is especially important if you cannot feel pain, heat, or cold. You can get burned.  Eating and drinking   · Drink enough fluid to keep your pee (urine) clear or pale yellow.  · Return to your normal diet as told by your doctor. Avoid heavy   or fried foods that are hard to digest.  · Avoid drinking alcohol for as long as told by your doctor.  Contact a doctor if:  · You have blood in your poop (stool) 2-3 days after the procedure.  Get help right away if:  · You have more than a small amount of blood in your poop.  · You see large clumps of tissue (blood clots) in your poop.  · Your belly is swollen.  · You feel sick to your stomach (nauseous).  · You throw up (vomit).  · You have a fever.  · You have belly pain that gets worse, and medicine does not help your pain.  This information is not intended to replace advice given to you by your health care provider. Make sure you discuss any questions you have with your health care  provider.  Document Released: 05/03/2010 Document Revised: 12/24/2015 Document Reviewed: 12/24/2015  Elsevier Interactive Patient Education © 2017 [employer] Inc[employer]

## 2017-03-03 NOTE — Transfer of Care (Signed)
Immediate Anesthesia Transfer of Care Note  Patient: [name]  Procedure(s) Performed: ESOPHAGOGASTRODUODENOSCOPY (EGD) WITH PROPOFOL (N/A ) COLONOSCOPY WITH PROPOFOL (N/A )  Patient Location: PACU  Anesthesia Type:MAC  Level of Consciousness: awake, alert  and oriented  Airway & Oxygen Therapy: Patient Spontanous Breathing and Patient connected to nasal cannula oxygen  Post-op Assessment: Report given to RN  Post vital signs: Reviewed and stable  Last Vitals:  Vitals:   03/03/17 0633  BP: (!) 172/79  Pulse: 66  Resp: 20  Temp: 36.7 C  SpO2: 94%    Last Pain:  Vitals:   03/03/17 0633  TempSrc: Oral         Complications: No apparent anesthesia complications

## 2017-03-03 NOTE — H&P (Signed)
[name] is an 71 y.o. female.   Chief Complaint: Difficulty swallowing.  HPI: 71 year old white female here for a screening colonoscopy. She has had some dysphagia and therefore an EGD is also planned to be done today. See office notes for details.  Past Medical History:  Diagnosis Date  . Anxiety   . Arthritis   . Bone spur   . Coronary artery disease    stent - 1999  . Depression   . Fibrocystic breast changes   . GERD (gastroesophageal reflux disease)   . Glucosuria   . Hernia, inguinal, left 10/2002  . Hypertension   . Melanoma ([place]) 10/2008   right shoulder and arm  . Menorrhagia   . Myocardial infarction ([place]) 1999   Past Surgical History:  Procedure Laterality Date  . ANGIOPLASTY  2000  . APPENDECTOMY    . bone chip removed from left foot     . CHOLECYSTECTOMY    . CORONARY ANGIOPLASTY WITH STENT PLACEMENT  10/99  . HERNIA REPAIR     umbilical   . HYSTEROSCOPY  2/98   D&C (polyps)  . LEFT HEART CATHETERIZATION WITH CORONARY ANGIOGRAM N/A 01/25/2014   Performed by Jettie Booze, MD at [facility] CATH LAB  . NODE DISSECTION     neg  . TOTAL ABDOMINAL HYSTERECTOMY     LSO     Failed TVH   Family History  Problem Relation Age of Onset  . Diabetes Mother   . Stroke Mother   . Hypertension Mother   . Heart disease Father   . Hypertension Father   . Cancer Father        pancreatic cancer  . Heart attack Father    Social History:  reports that  has never smoked. she has never used smokeless tobacco. She reports that she drinks about 0.5 oz of alcohol per week. She reports that she does not use drugs.  Allergies:  Allergies  Allergen Reactions  . Azithromycin Swelling  . Other Itching, Swelling, Other (See Comments) and Cough    Horse products  . Penicillins Swelling    Has patient had a PCN reaction causing immediate rash, facial/tongue/throat swelling, SOB or lightheadedness with hypotension: Yes Has patient had a PCN reaction causing severe rash  involving mucus membranes or skin necrosis: No Has patient had a PCN reaction that required hospitalization: No Has patient had a PCN reaction occurring within the last 10 years: No If all of the above answers are "NO", then may proceed with Cephalosporin use.   . Tetanus Toxoids Swelling  . Morphine And Related Nausea And Vomiting  . Augmentin [Amoxicillin-Pot Clavulanate] Rash    Has patient had a PCN reaction causing immediate rash, facial/tongue/throat swelling, SOB or lightheadedness with hypotension: Yes Has patient had a PCN reaction causing severe rash involving mucus membranes or skin necrosis: Yes Has patient had a PCN reaction that required hospitalization:No Has patient had a PCN reaction occurring within the last 10 years: No If all of the above answers are "NO", then may proceed with Cephalosporin use.    Medications Prior to Admission  Medication Sig Dispense Refill  . acetaminophen (TYLENOL 8 HOUR ARTHRITIS PAIN) 650 MG CR tablet Take 1,300 mg every 8 (eight) hours as needed by mouth for pain.    Marland Kitchen ALPRAZolam (XANAX) 0.25 MG tablet Take 0.25 mg 2 (two) times daily as needed by mouth for anxiety.    Marland Kitchen amLODipine (NORVASC) 10 MG tablet Take 10 mg at  bedtime by mouth.     Marland Kitchen aspirin EC 81 MG tablet Take 1 tablet (81 mg total) by mouth daily. (Patient taking differently: Take 81 mg at bedtime by mouth. ) 90 tablet 3  . atorvastatin (LIPITOR) 10 MG tablet Take 1 tablet (10 mg total) by mouth daily. 90 tablet 3  . Fish Oil-Cholecalciferol (OMEGA-3 + VITAMIN D3 PO) Take 1 tablet daily by mouth. 1280 mg-1000 I.U.    . glucosamine-chondroitin 500-400 MG tablet Take 1 tablet by mouth 2 (two) times daily.    . hydrochlorothiazide (HYDRODIURIL) 25 MG tablet Take 25 mg by mouth daily.    . isosorbide mononitrate (IMDUR) 60 MG 24 hr tablet Take 1 tablet (60 mg total) by mouth daily. 90 tablet 3  . lisinopril (PRINIVIL,ZESTRIL) 10 MG tablet Take 10 mg by mouth daily.    . metoprolol  (LOPRESSOR) 50 MG tablet Take 1 tablet (50 mg total) by mouth 2 (two) times daily. Decrease in dose 180 tablet 3  . Multiple Vitamin (MULTIVITAMIN WITH MINERALS) TABS tablet Take 1 tablet at bedtime by mouth.    . nitroGLYCERIN (NITROSTAT) 0.4 MG SL tablet DISSOLVE ONE TABLET UNDER THE TONGUE EVERY 5 MINUTES AS NEEDED FOR CHEST PAIN. 75 tablet 1  . potassium chloride SA (K-DUR,KLOR-CON) 20 MEQ tablet Take 1 tablet (20 mEq total) by mouth 2 (two) times daily. 180 tablet 3  . ranitidine (ZANTAC) 300 MG tablet Take 300 mg at bedtime by mouth.    . sertraline (ZOLOFT) 50 MG tablet Take 150 mg by mouth at bedtime.    . Soft Lens Products (REWETTING DROPS) SOLN Place 1 drop 3 (three) times daily as needed into both eyes (for dry/irritated contact lenses).    . vitamin B-12 (CYANOCOBALAMIN) 1000 MCG tablet Take 1,000 mcg daily by mouth.    . Vitamin D, Ergocalciferol, (DRISDOL) 50000 UNITS CAPS capsule Take 1 capsule (50,000 Units total) by mouth every 14 (fourteen) days. (Patient taking differently: Take 50,000 Units every 14 (fourteen) days by mouth. Sundays) 10 capsule 0    No results found for this or any previous visit (from the past 48 hour(s)). No results found.  Review of Systems  Eyes: Negative.   Respiratory: Negative.   Cardiovascular: Negative.   Gastrointestinal: Positive for heartburn. Negative for abdominal pain, blood in stool, constipation, diarrhea, melena, nausea and vomiting.  Genitourinary: Negative.   Musculoskeletal: Positive for joint pain.  Neurological: Negative.   Endo/Heme/Allergies: Negative.   Psychiatric/Behavioral: Positive for depression.    Blood pressure (!) 172/79, pulse 66, temperature 98 F (36.7 C), temperature source Oral, resp. rate 20, height 5\' 3"  (1.6 m), weight 99.8 kg (220 lb), SpO2 94 %. Physical Exam  Constitutional: She is oriented to person, place, and time. She appears well-developed and well-nourished.  HENT:  Head: Normocephalic and  atraumatic.  Eyes: Conjunctivae and EOM are normal. Pupils are equal, round, and reactive to light.  Neck: Normal range of motion. Neck supple.  Cardiovascular: Normal rate and regular rhythm.  Respiratory: Effort normal and breath sounds normal.  GI: Soft. Bowel sounds are normal.  Musculoskeletal: Normal range of motion.  Neurological: She is alert and oriented to person, place, and time.  Skin: Skin is warm.  Psychiatric: She has a normal mood and affect. Her behavior is normal. Judgment and thought content normal.    Assessment/Plan Dysphagia/GERD/CRC screening: proceed with an EGD/Colonoscopy at this time.  MANN,JYOTHI, MD 03/03/2017, 7:25 AM

## 2017-03-03 NOTE — Op Note (Signed)
[facility] Patient Name: [name] Procedure Date: 03/03/2017 MRN: [id number] Attending MD: Juanita Craver , MD Date of Birth: [birth year] CSN: [id number] Age: 71 Admit Type: Outpatient Procedure:                EGD with esophageal biopsies. Indications:              Dysphagia, Gastro-esophageal reflux disease. Providers:                Juanita Craver, MD, Cleda Daub, RN, Elspeth Cho                            Tech., Technician, Arnoldo Hooker, CRNA. Referring MD:             Rudean Hitt MD Medicines:                Monitored Anesthesia Care Complications:            No immediate complications. Estimated Blood Loss:     Estimated blood loss: minimal. Procedure:                Pre-anesthesia assessment: - Prior to the                            procedure, a history and physical was performed,                            and patient medications and allergies were                            reviewed. The patient's tolerance of previous                            anesthesia was also reviewed. The risks and                            benefits of the procedure and the sedation options                            and risks were discussed with the patient. All                            questions were answered, and informed consent was                            obtained. Prior anticoagulants: The patient has                            taken aspirin, last dose was 5 days prior to                            procedure. ASA Grade Assessment: III - A patient                            with severe systemic disease. After reviewing the  risks and benefits, the patient was deemed in                            satisfactory condition to undergo the procedure.                            After obtaining informed consent, the endoscope was                            passed under direct vision. Throughout the                            procedure, the  patient's blood pressure, pulse, and                            oxygen saturations were monitored continuously. The                            [id number] [phone]) scope was introduced through the                            mouth, and advanced to the second part of duodenum.                            The EGD was accomplished without difficulty. The                            patient tolerated the procedure well. Scope In: Scope Out: Findings:      Mucosal changes including ringed esophagus were found in the entire       esophagus. Esophageal findings were graded using the Eosinophilic       Esophagitis Endoscopic Reference Score (EoE-EREFS) as: Edema Grade 0       Normal (distinct vascular markings), Rings Grade 1 Mild (subtle       circumferential ridges seen on esophageal distension), Exudates Grade 0       None (no white lesions seen), Furrows Grade 1 Present (vertical lines       with or without visible depth) and Stricture none (no stricture found).       Biopsies were taken with a cold forceps for histology.      The entire examined stomach was normal.      The cardia and gastric fundus were normal on retroflexion.      The examined duodenum was normal. Impression:               - Esophageal mucosal changes suggestive of                            Eosinophilic Esophagitis-biopsies done.                           - Normal stomach.                           - Normal examined duodenum. Moderate Sedation:      MAC used. Recommendation:           -  High fiber diet with augmented water consumption                            daily.                           - Continue present medications.                           - Await pathology results.                           - Return to my office in 2 weeks. Procedure Code(s):        --- Professional ---                           [phone], Esophagogastroduodenoscopy, flexible,                            transoral; with biopsy, single or  multiple Diagnosis Code(s):        --- Professional ---                           R13.10, Dysphagia, unspecified                           K20.9, Esophagitis, unspecified CPT copyright 2016 American Medical Association. All rights reserved. The codes documented in this report are preliminary and upon coder review may  be revised to meet current compliance requirements. Juanita Craver, MD Juanita Craver, MD 03/03/2017 8:19:17 AM This report has been signed electronically. Number of Addenda: 0

## 2017-03-04 ENCOUNTER — Encounter (HOSPITAL_COMMUNITY): Payer: Self-pay | Admitting: Gastroenterology

## 2017-03-11 ENCOUNTER — Other Ambulatory Visit: Payer: Self-pay | Admitting: Interventional Cardiology

## 2017-04-21 ENCOUNTER — Telehealth: Payer: Self-pay | Admitting: Interventional Cardiology

## 2017-04-21 NOTE — Telephone Encounter (Signed)
Left detailed message on patient's VM (DPR on file) letting her know that she can take tylenol. Instructed for the patient to call back with any questions.

## 2017-04-21 NOTE — Telephone Encounter (Signed)
New Message  Pt c/o medication issue:  1. Name of Medication: slow relief tylenol   2. How are you currently taking this medication (dosage and times per day)? 500mg   3. Are you having a reaction (difficulty breathing--STAT)? No   4. What is your medication issue? Per pt would like to speak with RN. Pt would like to start taking this medication due to her arthritis recommended pt her doctor at [facility]. Pt would like to discuss with RN before taking

## 2017-09-01 ENCOUNTER — Other Ambulatory Visit: Payer: Self-pay | Admitting: Interventional Cardiology

## 2017-10-09 ENCOUNTER — Other Ambulatory Visit: Payer: Self-pay | Admitting: Family Medicine

## 2017-10-09 DIAGNOSIS — R519 Headache, unspecified: Secondary | ICD-10-CM

## 2017-10-09 DIAGNOSIS — R51 Headache: Principal | ICD-10-CM

## 2017-10-22 ENCOUNTER — Ambulatory Visit
Admission: RE | Admit: 2017-10-22 | Discharge: 2017-10-22 | Disposition: A | Discharge status: Home / Self Care | Payer: Medicare Other | Source: Ambulatory Visit | Attending: Family Medicine | Admitting: Family Medicine

## 2017-10-22 DIAGNOSIS — R51 Headache: Principal | ICD-10-CM

## 2017-10-22 DIAGNOSIS — R519 Headache, unspecified: Secondary | ICD-10-CM

## 2017-10-28 ENCOUNTER — Other Ambulatory Visit: Payer: Self-pay

## 2017-10-28 MED ORDER — ISOSORBIDE MONONITRATE ER 60 MG PO TB24: 60.0000 mg | ORAL_TABLET | Freq: Every day | ORAL | 0 refills | Status: DC

## 2017-12-31 ENCOUNTER — Telehealth: Payer: Self-pay | Admitting: Neurology

## 2017-12-31 ENCOUNTER — Ambulatory Visit: Payer: Medicare Other | Admitting: Neurology

## 2017-12-31 ENCOUNTER — Encounter: Payer: Self-pay | Admitting: Neurology

## 2017-12-31 VITALS — BP 180/83 | HR 64 | Ht 63.0 in | Wt 220.0 lb

## 2017-12-31 DIAGNOSIS — G479 Sleep disorder, unspecified: Secondary | ICD-10-CM | POA: Diagnosis not present

## 2017-12-31 DIAGNOSIS — R51 Headache: Secondary | ICD-10-CM | POA: Diagnosis not present

## 2017-12-31 DIAGNOSIS — R519 Headache, unspecified: Secondary | ICD-10-CM

## 2017-12-31 DIAGNOSIS — E669 Obesity, unspecified: Secondary | ICD-10-CM | POA: Diagnosis not present

## 2017-12-31 DIAGNOSIS — G4489 Other headache syndrome: Secondary | ICD-10-CM

## 2017-12-31 DIAGNOSIS — R351 Nocturia: Secondary | ICD-10-CM | POA: Diagnosis not present

## 2017-12-31 NOTE — Progress Notes (Signed)
Subjective:    Patient ID: [name] is a 72 y.o. female.  HPI     Star Age, MD, PhD [facility] [address] [place], [place] [address]  Dear Dr. Drema Dallas,  I saw your patient, [name], upon your kind request in my neurologic clinic today for initial consultation of her new onset recurrent headaches. The patient is accompanied by her husband today.  As you know, [name] is a 72 year old right-handed woman with an underlying medical history of hypertension, prediabetes, vitamin D deficiency, coronary artery disease with status post stenting, anxiety, depression, hyperlipidemia, and obesity, who reports recent onset of headaches in January 2019. Headaches came on gradually and are located in the right posterior area. She had years ago a sebaceous cyst removed which was rather large and in the same area. She feels like the headache is in the scar area. The scar is not actually bothering her as such. She cannot pinpoint a very small area, it is a achy headache. She has no associated photophobia or nausea or vomiting, its typically not throbbing but more constant. She has had neck pain on the right side without any significant radiation. She has occasional left arm tingling but no radiating pain on the left. She has otherwise no one-sided weakness or numbness, no facial symptoms nor slurring of speech. She has no headache history in the past. She has never had an MRI. Of note, she did get a single coronary stent placed in October 1999. She does not report any significant snoring but does have sleep disturbance particularly difficulty maintaining sleep. She is in bed around 1 AM typically, falls asleep okay but wakes up around 5. She has nocturia about once per average night. She has difficulty maintaining sleep after that. She often lays in bed and falls asleep around 8 AM and sleeps till noon typically. She also takes an afternoon nap of about  30-45 minutes. Sometimes she sleeps longer than that. She has noticed clicking sensation or sounds in her neck. She feels that she has reduced neck mobility. She is retired and lives with her husband, they have 2 children. She is a nonsmoker and drinks alcohol very occasionally, soda about 1-2 servings per day on average. She reports right ear fullness. She was told that she had mild inflammation type changes in her ear canal, no ringing in the ear reported. I reviewed your office records, which you kindly included. You ordered a head CT. She had a head CT without contrast on 10/22/2017 and I reviewed the results: Impression: Normal head CT. She has never had a sleep study but her cardiologist recommended that she pursue a sleep study.for her headaches she has not really tried anything, other than taking her typical dose of Tylenol which she takes daily for arthritis.  Her Past Medical History Is Significant For: Past Medical History:  Diagnosis Date  . Anxiety   . Arthritis   . Bone spur   . Coronary artery disease    stent - 1999  . Depression   . Fibrocystic breast changes   . GERD (gastroesophageal reflux disease)   . Glucosuria   . Hernia, inguinal, left 10/2002  . Hypertension   . Melanoma ([place]) 10/2008   right shoulder and arm  . Menorrhagia   . Myocardial infarction [facility]) 1999    Her Past Surgical History Is Significant For: Past Surgical History:  Procedure Laterality Date  . ANGIOPLASTY  2000  . APPENDECTOMY    .  bone chip removed from left foot     . CHOLECYSTECTOMY    . COLONOSCOPY WITH PROPOFOL N/A 03/03/2017   Procedure: COLONOSCOPY WITH PROPOFOL;  Surgeon: Juanita Craver, MD;  Location: [facility] ENDOSCOPY;  Service: Endoscopy;  Laterality: N/A;  . CORONARY ANGIOPLASTY WITH STENT PLACEMENT  10/99  . ESOPHAGOGASTRODUODENOSCOPY (EGD) WITH PROPOFOL N/A 03/03/2017   Procedure: ESOPHAGOGASTRODUODENOSCOPY (EGD) WITH PROPOFOL;  Surgeon: Juanita Craver, MD;  Location: [facility] ENDOSCOPY;   Service: Endoscopy;  Laterality: N/A;  . HERNIA REPAIR     umbilical   . HYSTEROSCOPY  2/98   D&C (polyps)  . LEFT HEART CATHETERIZATION WITH CORONARY ANGIOGRAM N/A 01/25/2014   Procedure: LEFT HEART CATHETERIZATION WITH CORONARY ANGIOGRAM;  Surgeon: Jettie Booze, MD;  Location: [facility] CATH LAB;  Service: Cardiovascular;  Laterality: N/A;  . NODE DISSECTION     neg  . TOTAL ABDOMINAL HYSTERECTOMY     LSO     Failed TVH    Her Family History Is Significant For: Family History  Problem Relation Age of Onset  . Diabetes Mother   . Stroke Mother   . Hypertension Mother   . Heart disease Father   . Hypertension Father   . Cancer Father        pancreatic cancer  . Heart attack Father     Her Social History Is Significant For: Social History   Socioeconomic History  . Marital status: Married    Spouse name: Not on file  . Number of children: Not on file  . Years of education: Not on file  . Highest education level: Not on file  Occupational History  . Not on file  Social Needs  . Financial resource strain: Not on file  . Food insecurity:    Worry: Not on file    Inability: Not on file  . Transportation needs:    Medical: Not on file    Non-medical: Not on file  Tobacco Use  . Smoking status: Never Smoker  . Smokeless tobacco: Never Used  Substance and Sexual Activity  . Alcohol use: Yes    Alcohol/week: 1.0 standard drinks    Types: 1 Standard drinks or equivalent per week    Comment: occ glass of wine  . Drug use: No    Frequency: 5.0 times per week  . Sexual activity: Yes    Partners: Male    Birth control/protection: Post-menopausal, Surgical    Comment: TAH/LSO  Lifestyle  . Physical activity:    Days per week: Not on file    Minutes per session: Not on file  . Stress: Not on file  Relationships  . Social connections:    Talks on phone: Not on file    Gets together: Not on file    Attends religious service: Not on file    Active member of club or  organization: Not on file    Attends meetings of clubs or organizations: Not on file    Relationship status: Not on file  Other Topics Concern  . Not on file  Social History Narrative  . Not on file    Her Allergies Are:  Allergies  Allergen Reactions  . Azithromycin Swelling  . Other Itching, Swelling, Other (See Comments) and Cough    Horse products  . Penicillins Swelling    Has patient had a PCN reaction causing immediate rash, facial/tongue/throat swelling, SOB or lightheadedness with hypotension: Yes Has patient had a PCN reaction causing severe rash involving mucus membranes or skin necrosis: No  Has patient had a PCN reaction that required hospitalization: No Has patient had a PCN reaction occurring within the last 10 years: No If all of the above answers are "NO", then may proceed with Cephalosporin use.   . Tetanus Toxoids Swelling  . Morphine And Related Nausea And Vomiting  . Augmentin [Amoxicillin-Pot Clavulanate] Rash    Has patient had a PCN reaction causing immediate rash, facial/tongue/throat swelling, SOB or lightheadedness with hypotension: Yes Has patient had a PCN reaction causing severe rash involving mucus membranes or skin necrosis: Yes Has patient had a PCN reaction that required hospitalization:No Has patient had a PCN reaction occurring within the last 10 years: No If all of the above answers are "NO", then may proceed with Cephalosporin use.   :   Her Current Medications Are:  Outpatient Encounter Medications as of 12/31/2017  Medication Sig  . acetaminophen (TYLENOL 8 HOUR ARTHRITIS PAIN) 650 MG CR tablet Take 1,300 mg every 8 (eight) hours as needed by mouth for pain.  Marland Kitchen amLODipine (NORVASC) 10 MG tablet Take 10 mg at bedtime by mouth.   Marland Kitchen atorvastatin (LIPITOR) 10 MG tablet Take 1 tablet (10 mg total) by mouth daily.  . Fish Oil-Cholecalciferol (OMEGA-3 + VITAMIN D3 PO) Take 1 tablet daily by mouth. 1280 mg-1000 I.U.  . hydrochlorothiazide  (HYDRODIURIL) 25 MG tablet Take 25 mg by mouth daily.  . isosorbide mononitrate (IMDUR) 60 MG 24 hr tablet Take 1 tablet (60 mg total) by mouth daily. Please keep upcoming appointment for further refills  . lisinopril (PRINIVIL,ZESTRIL) 10 MG tablet Take 10 mg by mouth daily.  . metoprolol (LOPRESSOR) 50 MG tablet Take 1 tablet (50 mg total) by mouth 2 (two) times daily. Decrease in dose  . Multiple Vitamin (MULTIVITAMIN WITH MINERALS) TABS tablet Take 1 tablet at bedtime by mouth.  . nitroGLYCERIN (NITROSTAT) 0.4 MG SL tablet DISSOLVE 1 TABLET UNDER THE TONGUE EVERY 5 MINUTES AS NEEDED FOR CHEST PAIN  . potassium chloride SA (K-DUR,KLOR-CON) 20 MEQ tablet Take 1 tablet (20 mEq total) by mouth 2 (two) times daily.  . ranitidine (ZANTAC) 300 MG tablet Take 300 mg at bedtime by mouth.  . sertraline (ZOLOFT) 50 MG tablet Take 150 mg by mouth at bedtime.  . Soft Lens Products (REWETTING DROPS) SOLN Place 1 drop 3 (three) times daily as needed into both eyes (for dry/irritated contact lenses).  . [DISCONTINUED] ALPRAZolam (XANAX) 0.25 MG tablet Take 0.25 mg 2 (two) times daily as needed by mouth for anxiety.  . [DISCONTINUED] glucosamine-chondroitin 500-400 MG tablet Take 1 tablet by mouth 2 (two) times daily.  . [DISCONTINUED] vitamin B-12 (CYANOCOBALAMIN) 1000 MCG tablet Take 1,000 mcg daily by mouth.  . [DISCONTINUED] Vitamin D, Ergocalciferol, (DRISDOL) 50000 UNITS CAPS capsule Take 1 capsule (50,000 Units total) by mouth every 14 (fourteen) days. (Patient taking differently: Take 50,000 Units every 14 (fourteen) days by mouth. Sundays)   No facility-administered encounter medications on file as of 12/31/2017.   : Review of Systems:  Out of a complete 14 point review of systems, all are reviewed and negative with the exception of these symptoms as listed below: Review of Systems  Neurological:       Pt presents today to discuss her headaches. Pt has had a continuous daily headache since January.  Pt has not tried any medications to relieve her pain. Pt is not sensitive to light or sound and denies having associated nausea. Pt has never had a sleep study and denies snoring.  Epworth  Sleepiness Scale 0= would never doze 1= slight chance of dozing 2= moderate chance of dozing 3= high chance of dozing  Sitting and reading: 0 Watching TV: 0 Sitting inactive in a public place (ex. Theater or meeting): 2 As a passenger in a car for an hour without a break: 2 Lying down to rest in the afternoon: 3 Sitting and talking to someone: 0 Sitting quietly after lunch (no alcohol): 0 In a car, while stopped in traffic: 0 Total: 7     Objective:  Neurological Exam  Physical Exam Physical Examination:   Vitals:   12/31/17 1007  BP: (!) 180/83  Pulse: 64   General Examination: The patient is a very pleasant 72 y.o. female in no acute distress. She appears well-developed and well-nourished and well groomed.   HEENT: Normocephalic, atraumatic, pupils are equal, round and reactive to light and accommodation. Funduscopic exam is difficult secondary to cataracts. She has contact lenses in place. Right eyelid is a little droopy. This is not new. She states that she had used a new contact lens solution which got into her eye and since then she had a droopy eyelid. Hearing is grossly intact, mildly impaired perhaps. Speech is clear, facial symmetry is normal. Airway examination reveals a neck circumference of 15 inches, moderate airway crowding secondary to quite small airway opening, redundant soft palate, tonsils probably on the smaller side around 1+ but not fully visualize, Mallampati is class III. Tongue protrudes centrally and palate elevates symmetrically, no carotid bruits. She has limitation in her active neck mobility with right neck turn in particular. She has no palpable scar in the right post head.   Chest: Clear to auscultation without wheezing, rhonchi or crackles noted.  Heart:  S1+S2+0, regular and normal without murmurs, rubs or gallops noted.   Abdomen: Soft, non-tender and non-distended with normal bowel sounds appreciated on auscultation.  Extremities: There is trace pitting edema in the distal lower extremities bilaterally.   Skin: Warm and dry without trophic changes noted.   Musculoskeletal: exam reveals arthritic changes in both hands, she also reports bilateral foot pain and knee pain.   Neurologically:  Mental status: The patient is awake, alert and oriented in all 4 spheres. Her immediate and remote memory, attention, language skills and fund of knowledge are appropriate. There is no evidence of aphasia, agnosia, apraxia or anomia. Speech is clear with normal prosody and enunciation. Thought process is linear. Mood is normal and affect is normal.  Cranial nerves II - XII are as described above under HEENT exam. In addition: shoulder shrug is normal with equal shoulder height noted. Motor exam: Normal bulk, strength and tone is noted. There is no drift, tremor or rebound. Romberg is negative. Reflexes are 1+ throughout, incl ankles. Fine motor skills and coordination: intact with normal finger taps, normal hand movements, normal rapid alternating patting, normal foot taps and normal foot agility.  Cerebellar testing: No dysmetria or intention tremor. There is no truncal or gait ataxia.  Sensory exam: intact to light touch in the upper and lower extremities.  Gait, station and balance: She stands with difficulty and pushes herself up. She stands slightly wide-based. She walks with a limp on the right, secondary to foot pain and knee pain.  Assessment and Plan:  Assessment and Plan:  In summary, [name] is a very pleasant 72 y.o.-year old female with an underlying medical history of hypertension, prediabetes, vitamin D deficiency, coronary artery disease with status post stenting, anxiety, depression, hyperlipidemia,  and obesity, who presents for initial  consultation of her new onset headaches which started in January of this year. She also has concomitant neck pain and mobility limitation with neck turns. Felt a clicking in her neck. She has never had any examination or consultation for neck arthritis. We will pursue further testing to get to the bottom of her headaches. I suggested we proceed with a brain MRI with and without contrast, neck MRI without contrast and also sleep study to rule out sleep apnea as a potential cause of her persistent headaches. She has a nonfocal neurological exam at this time. She is reassured in that regard. I will not initiate any new medication. She does report difficulty with sleep maintenance and has a rather narrow airway. She is encouraged to call [facility] cardiology to find noted her coronary stent is MRI compatible. She reports that she has called her current cardiologist and he encouraged her to call the hospital cardiology. I will see her back after testing is completed. We will keep her posted in the interim as to her test results. I answered all their questions today and the patient and her husband were in agreement with the above outlined plan. Thank you very much for allowing me to participate in the care of this nice patient. If I can be of any further assistance to you please do not hesitate to call me at [phone].  Sincerely,   Star Age, MD, PhD

## 2017-12-31 NOTE — Patient Instructions (Addendum)
We will try to get to the bottom of your headaches.  You also have had neck pain on the right.  We will do a cervical spine (i.e. neck) MRI to look for degenerative changes.  We will do a brain scan, called MRI and call you with the test results. We will have to schedule you for this on a separate date. This test requires authorization from your insurance, and we will take care of the insurance process. We will also do a sleep study to rule out sleep apnea.  Please call the [facility] Cardiology department to find out if your cardiac stent is MRI compatible.

## 2017-12-31 NOTE — Telephone Encounter (Signed)
[employer] Medicare order sent to GI. No auth they will reach out to the pt to schedule.  °

## 2018-01-01 LAB — COMPREHENSIVE METABOLIC PANEL
ALT: 23 IU/L (ref 0–32)
AST: 21 IU/L (ref 0–40)
Albumin/Globulin Ratio: 2.2 (ref 1.2–2.2)
Albumin: 4.1 g/dL (ref 3.5–4.8)
Alkaline Phosphatase: 69 IU/L (ref 39–117)
BUN/Creatinine Ratio: 24 (ref 12–28)
BUN: 17 mg/dL (ref 8–27)
Bilirubin Total: 0.5 mg/dL (ref 0.0–1.2)
CO2: 27 mmol/L (ref 20–29)
Calcium: 9.5 mg/dL (ref 8.7–10.3)
Chloride: 101 mmol/L (ref 96–106)
Creatinine, Ser: 0.7 mg/dL (ref 0.57–1.00)
GFR calc Af Amer: 101 mL/min/{1.73_m2} (ref >59)
GFR calc non Af Amer: 87 mL/min/{1.73_m2} (ref >59)
Globulin, Total: 1.9 g/dL (ref 1.5–4.5)
Glucose: 96 mg/dL (ref 65–99)
Potassium: 4 mmol/L (ref 3.5–5.2)
Sodium: 143 mmol/L (ref 134–144)
Total Protein: 6 g/dL (ref 6.0–8.5)

## 2018-01-09 ENCOUNTER — Ambulatory Visit
Admission: RE | Admit: 2018-01-09 | Discharge: 2018-01-09 | Disposition: A | Discharge status: Home / Self Care | Payer: Medicare Other | Source: Ambulatory Visit | Attending: Neurology | Admitting: Neurology

## 2018-01-09 DIAGNOSIS — G479 Sleep disorder, unspecified: Secondary | ICD-10-CM

## 2018-01-09 DIAGNOSIS — E669 Obesity, unspecified: Secondary | ICD-10-CM

## 2018-01-09 DIAGNOSIS — R351 Nocturia: Secondary | ICD-10-CM

## 2018-01-09 DIAGNOSIS — R51 Headache: Secondary | ICD-10-CM | POA: Diagnosis not present

## 2018-01-09 DIAGNOSIS — R519 Headache, unspecified: Secondary | ICD-10-CM

## 2018-01-09 DIAGNOSIS — G4489 Other headache syndrome: Secondary | ICD-10-CM

## 2018-01-09 MED ORDER — GADOBENATE DIMEGLUMINE 529 MG/ML IV SOLN
20.0000 mL | Freq: Once | INTRAVENOUS | Status: AC | PRN
  Administered 2018-01-09: 20 mL via INTRAVENOUS

## 2018-01-11 ENCOUNTER — Telehealth: Payer: Self-pay

## 2018-01-11 NOTE — Telephone Encounter (Signed)
-----   Message from Star Age, MD sent at 01/11/2018  9:29 AM EDT ----- Please call patient regarding the recent brain MRI: The brain scan showed a normal structure of the brain and no significant volume loss which we call atrophy. There were changes in the deeper structures of the brain, which we call white matter changes or microvascular changes. These were reported as moderate in Her case. These are tiny white spots, that occur with time and are seen in a variety of conditions, including with normal aging, chronic hypertension, chronic headaches, especially migraine HAs, chronic diabetes, chronic hyperlipidemia. These are not strokes and no mass or lesion or contrast enhancement was seen which is reassuring. Again, there were no acute findings, such as a stroke, or mass or blood products. No further action is required on this test at this time, other than re-enforcing the importance of good blood pressure control, good cholesterol control, good blood sugar control, and weight management.   Neck MRI showed degenerative changes at multiple levels, particularly at C4-5. Nevertheless, they are overall mild changes, nothing that would warrant surgical evaluation at this time. She could have some nonspecific neck pain, particularly on the left side. If neck pain is bothersome she could see a spine specialist or a pain specialist such as Dr. Maryjean Ka. I would say we wait out sleep study results too. PSG scheduled for this week.   Star Age, MD, PhD

## 2018-01-11 NOTE — Telephone Encounter (Signed)
I called pt to discuss her MRI results. No answer, left a message asking her to call me back. 

## 2018-01-11 NOTE — Telephone Encounter (Signed)
Pt left a VM stating she was wanting info for cost of sleep study.  Pt stated on that message that I could give info to husband if she isn't available.  I gave husband CPT codes and dollar amts of what we may charge and they are to call customer service number on back of insurance card.  Pt's husband was grateful for info. Told him to call back with any further questions or concerns.

## 2018-01-11 NOTE — Telephone Encounter (Signed)
I spoke to the patient and informed her that she would have to call [facility] to find out how much the MRI cost since that is where she had her MRI. She stated that she has their number and will call

## 2018-01-11 NOTE — Telephone Encounter (Signed)
I called pt and explained her MRI results. Pt verbalized understanding of results.  Pt is asking for our coordinator to call her. She wants to know how much her MRIs will cost her.  Pt also wants our sleep lab to call her to discuss how much the sleep study will cost her.  I will ask that both of these depts call pt. Pt verbalized understanding and appreciation.

## 2018-01-15 ENCOUNTER — Ambulatory Visit (INDEPENDENT_AMBULATORY_CARE_PROVIDER_SITE_OTHER): Payer: Medicare Other | Admitting: Neurology

## 2018-01-15 DIAGNOSIS — G4733 Obstructive sleep apnea (adult) (pediatric): Secondary | ICD-10-CM

## 2018-01-15 DIAGNOSIS — R351 Nocturia: Secondary | ICD-10-CM

## 2018-01-15 DIAGNOSIS — R51 Headache: Secondary | ICD-10-CM | POA: Diagnosis not present

## 2018-01-15 DIAGNOSIS — E669 Obesity, unspecified: Secondary | ICD-10-CM

## 2018-01-15 DIAGNOSIS — R519 Headache, unspecified: Secondary | ICD-10-CM

## 2018-01-15 DIAGNOSIS — G4489 Other headache syndrome: Secondary | ICD-10-CM

## 2018-01-15 DIAGNOSIS — G479 Sleep disorder, unspecified: Secondary | ICD-10-CM | POA: Diagnosis not present

## 2018-01-15 DIAGNOSIS — G4720 Circadian rhythm sleep disorder, unspecified type: Secondary | ICD-10-CM

## 2018-01-17 NOTE — Progress Notes (Signed)
Cardiology Office Note   Date:  01/18/2018   ID:  Tovah, Slavick 26-Jan-1946, MRN [id number]  PCP:  Leighton Ruff, MD    No chief complaint on file.  CAD  Wt Readings from Last 3 Encounters:  01/18/18 217 lb 9.6 oz (98.7 kg)  12/31/17 220 lb (99.8 kg)  03/03/17 220 lb (99.8 kg)       History of Present Illness: [name] is a 72 y.o. female  who has had CAD. She had PCI in 1999 and 2002. She had a cath without obstructive disease in 2015.  At the last visit, I stressed the importance of more exercise to her for her overall well-being.  She has had a chronic headache and is now seeing neuro.  Denies : exertional Chest pain. Dizziness. Leg edema. Orthopnea. Palpitations. Paroxysmal nocturnal dyspnea. Shortness of breath. Syncope.   Has an occasional soreness in the chest. She takes NTG.  Not related to exertion.  Pain is sharp and goes away within a few minutes. It is totally different than her prior angina.   No regular walking or exercise.    Past Medical History:  Diagnosis Date  . Anxiety   . Arthritis   . Bone spur   . Coronary artery disease    stent - 1999  . Depression   . Fibrocystic breast changes   . GERD (gastroesophageal reflux disease)   . Glucosuria   . Hernia, inguinal, left 10/2002  . Hypertension   . Melanoma ([place]) 10/2008   right shoulder and arm  . Menorrhagia   . Myocardial infarction ([place]) 1999    Past Surgical History:  Procedure Laterality Date  . ANGIOPLASTY  2000  . APPENDECTOMY    . bone chip removed from left foot     . CHOLECYSTECTOMY    . COLONOSCOPY WITH PROPOFOL N/A 03/03/2017   Procedure: COLONOSCOPY WITH PROPOFOL;  Surgeon: Juanita Craver, MD;  Location: [facility];  Service: Endoscopy;  Laterality: N/A;  . CORONARY ANGIOPLASTY WITH STENT PLACEMENT  10/99  . ESOPHAGOGASTRODUODENOSCOPY (EGD) WITH PROPOFOL N/A 03/03/2017   Procedure: ESOPHAGOGASTRODUODENOSCOPY (EGD) WITH PROPOFOL;  Surgeon: Juanita Craver,  MD;  Location: [facility];  Service: Endoscopy;  Laterality: N/A;  . HERNIA REPAIR     umbilical   . HYSTEROSCOPY  2/98   D&C (polyps)  . LEFT HEART CATHETERIZATION WITH CORONARY ANGIOGRAM N/A 01/25/2014   Procedure: LEFT HEART CATHETERIZATION WITH CORONARY ANGIOGRAM;  Surgeon: Jettie Booze, MD;  Location: [facility];  Service: Cardiovascular;  Laterality: N/A;  . NODE DISSECTION     neg  . TOTAL ABDOMINAL HYSTERECTOMY     LSO     Failed TVH     Current Outpatient Medications  Medication Sig Dispense Refill  . acetaminophen (TYLENOL 8 HOUR ARTHRITIS PAIN) 650 MG CR tablet Take 1,300 mg every 8 (eight) hours as needed by mouth for pain.    Marland Kitchen amLODipine (NORVASC) 10 MG tablet Take 10 mg at bedtime by mouth.     Marland Kitchen atorvastatin (LIPITOR) 10 MG tablet Take 1 tablet (10 mg total) by mouth daily. 90 tablet 3  . Fish Oil-Cholecalciferol (OMEGA-3 + VITAMIN D3 PO) Take 1 tablet daily by mouth. 1280 mg-1000 I.U.    . hydrochlorothiazide (HYDRODIURIL) 25 MG tablet Take 25 mg by mouth daily.    . isosorbide mononitrate (IMDUR) 60 MG 24 hr tablet Take 1 tablet (60 mg total) by mouth daily. Please keep upcoming appointment for further refills 90  tablet 0  . lisinopril (PRINIVIL,ZESTRIL) 10 MG tablet Take 10 mg by mouth daily.    . metoprolol (LOPRESSOR) 50 MG tablet Take 1 tablet (50 mg total) by mouth 2 (two) times daily. Decrease in dose 180 tablet 3  . Multiple Vitamin (MULTIVITAMIN WITH MINERALS) TABS tablet Take 1 tablet at bedtime by mouth.    . nitroGLYCERIN (NITROSTAT) 0.4 MG SL tablet DISSOLVE 1 TABLET UNDER THE TONGUE EVERY 5 MINUTES AS NEEDED FOR CHEST PAIN 75 tablet 1  . potassium chloride SA (K-DUR,KLOR-CON) 20 MEQ tablet Take 1 tablet (20 mEq total) by mouth 2 (two) times daily. 180 tablet 3  . ranitidine (ZANTAC) 300 MG tablet Take 300 mg at bedtime by mouth.    . sertraline (ZOLOFT) 50 MG tablet Take 150 mg by mouth at bedtime.    . Soft Lens Products (REWETTING DROPS) SOLN  Place 1 drop 3 (three) times daily as needed into both eyes (for dry/irritated contact lenses).     No current facility-administered medications for this visit.     Allergies:   Azithromycin; Other; Penicillins; Tetanus toxoids; Gadolinium derivatives; Morphine and related; and Augmentin [amoxicillin-pot clavulanate]    Social History:  The patient  reports that she has never smoked. She has never used smokeless tobacco. She reports that she drinks about 1.0 standard drinks of alcohol per week. She reports that she does not use drugs.   Family History:  The patient's family history includes Cancer in her father; Diabetes in her mother; Heart attack in her father; Heart disease in her father; Hypertension in her father and mother; Stroke in her mother.    ROS:  Please see the history of present illness.   Otherwise, review of systems are positive for chest pain.   All other systems are reviewed and negative.    PHYSICAL EXAM: VS:  BP (!) 152/78   Pulse 68   Ht 5\' 3"  (1.6 m)   Wt 217 lb 9.6 oz (98.7 kg)   SpO2 96%   BMI 38.55 kg/m  , BMI Body mass index is 38.55 kg/m. GEN: Well nourished, well developed, in no acute distress  HEENT: normal  Neck: no JVD, carotid bruits, or masses Cardiac: RRR; no murmurs, rubs, or gallops,no edema  Respiratory:  clear to auscultation bilaterally, normal work of breathing GI: soft, nontender, nondistended, + BS MS: no deformity or atrophy  Skin: warm and dry, no rash Neuro:  Strength and sensation are intact Psych: euthymic mood, full affect   EKG:   The ekg ordered today demonstrates NSR, nonspecific ST segment   Recent Labs: 12/31/2017: ALT 23; BUN 17; Creatinine, Ser 0.70; Potassium 4.0; Sodium 143   Lipid Panel No results found for: CHOL, TRIG, HDL, CHOLHDL, VLDL, LDLCALC, LDLDIRECT   Other studies Reviewed: Additional studies/ records that were reviewed today with results demonstrating: cath results reviewed.   ASSESSMENT AND  PLAN:  1. CAD: No angina on medical therapy.  COntinue aggressive secondary prevention.  Atypical cehst pain.  WOuld not work up at this time. She thinks it is related to how she sleeps since sx only occur in the morning shortly after walking up. THey occur only a few times a month.  2. Hyperlipidemia: LDL 82. COntinue atorvastatin.  3. HTN: High today.  At home, readings are usually 737 systolic.  Increase lisinopril to 20 mg daily.  Blood work scheduled with PMD.  Please do BMet. 4. Morbid obesity: We spoke again of increasing exercise.    Current medicines  are reviewed at length with the patient today.  The patient concerns regarding her medicines were addressed.  The following changes have been made:  Increase lisinopril  Labs/ tests ordered today include:  No orders of the defined types were placed in this encounter.   Recommend 150 minutes/week of aerobic exercise Low fat, low carb, high fiber diet recommended  Disposition:   FU in 1 year   Signed, Rebecca Grooms, MD  01/18/2018 3:44 PM    [facility] [address], [place], [place]  [address] Phone: [phone]; Fax: [phone]

## 2018-01-18 ENCOUNTER — Telehealth: Payer: Self-pay

## 2018-01-18 ENCOUNTER — Encounter: Payer: Self-pay | Admitting: Interventional Cardiology

## 2018-01-18 ENCOUNTER — Ambulatory Visit: Payer: Medicare Other | Admitting: Interventional Cardiology

## 2018-01-18 ENCOUNTER — Telehealth: Payer: Self-pay | Admitting: Neurology

## 2018-01-18 VITALS — BP 152/78 | HR 68 | Ht 63.0 in | Wt 217.6 lb

## 2018-01-18 DIAGNOSIS — I10 Essential (primary) hypertension: Secondary | ICD-10-CM | POA: Diagnosis not present

## 2018-01-18 DIAGNOSIS — I25118 Atherosclerotic heart disease of native coronary artery with other forms of angina pectoris: Secondary | ICD-10-CM

## 2018-01-18 DIAGNOSIS — E782 Mixed hyperlipidemia: Secondary | ICD-10-CM

## 2018-01-18 MED ORDER — LISINOPRIL 20 MG PO TABS: 20.0000 mg | ORAL_TABLET | Freq: Every day | ORAL | 3 refills | Status: DC

## 2018-01-18 NOTE — Telephone Encounter (Signed)
-----   Message from Star Age, MD sent at 01/18/2018  8:13 AM EDT ----- Patient referred by Dr. Drema Dallas, seen by me on 12/10/17, split night sleep study on 01/17/18. Please call and notify patient that the recent sleep study confirmed the diagnosis of severe OSA. She did well with CPAP during the study with significant improvement of the respiratory events. Therefore, I would like start the patient on CPAP therapy at home by prescribing a machine for home use. I placed the order in the chart.  Please advise patient that we need a follow up appointment with either myself or one of our nurse practitioners in about 10 weeks post set-up to check for how the patient is feeling and how well the patient is using the machine, etc. Please go ahead and schedule the appointment, while you have the patient on the phone and make sure patient understands the importance of keeping this window for the FU appointment, as it is often an insurance requirement. Failing to adhere to this may result in losing coverage for sleep apnea treatment, at which point most patients are left with a choice of returning the machine or paying out of pocket (and we want neither of this to happen!).  Please re-enforce the importance of compliance with treatment and the need for [place] to monitor compliance data - again an insurance requirement and usually a good feedback for the patient as far as how they are doing.  Also remind patient, that any PAP machine or mask issues should be first addressed with the DME company, who provided the machine/mask.  Please ask if patient has a preference regarding DME company, may depend on the insurance too.  Please arrange for CPAP set up at home through a DME company of patient's choice.  Once you have spoken to the patient you can close the phone encounter. Please fax/route report to referring provider, thanks,   Star Age, MD, PhD [facility])

## 2018-01-18 NOTE — Telephone Encounter (Signed)
See telephone note from 01/18/18.

## 2018-01-18 NOTE — Procedures (Signed)
PATIENT'S NAME:  [name], [name] DOB:      February 12, 1946      MR#:    [id number]     DATE OF RECORDING: 01/15/2018 REFERRING M.D.:  Leighton Ruff, MD Study Performed:  Split-Night Titration Study HISTORY: 72 year old woman with a history of hypertension, prediabetes, vitamin D deficiency, coronary artery disease with status post stenting, anxiety, depression, hyperlipidemia, and obesity, who reports recent onset of headaches. She has difficulty maintaining sleep. The patient endorsed the Epworth Sleepiness Scale at 7/24 points. The patient's weight 220 pounds with a height of 63 (inches), resulting in a BMI of 39.1 kg/m2. The patient's neck circumference measured 15 inches.  CURRENT MEDICATIONS: Tylenol, Norvasc, Lipitor, Fish Oil, Hydrodiuril, Imdur, Prinivil, Lopressor, Multivitamin, Nitrostat, K-Dur, Zantac, Zoloft.  PROCEDURE:  This is a multichannel digital polysomnogram utilizing the Somnostar 11.2 system.  Electrodes and sensors were applied and monitored per AASM Specifications.   EEG, EOG, Chin and Limb EMG, were sampled at 200 Hz.  ECG, Snore and Nasal Pressure, Thermal Airflow, Respiratory Effort, CPAP Flow and Pressure, Oximetry was sampled at 50 Hz. Digital video and audio were recorded.      BASELINE STUDY WITHOUT CPAP RESULTS:  Lights Out was at 21:47 and Lights On at 05:24 for the night, split study 01:10, epoch 413. Total recording time (TRT) was 202.5, with a total sleep time (TST) of 136.5 minutes.   The patient's sleep latency was 21 minutes.  REM sleep was absent. The sleep efficiency was 67.4 %.    SLEEP ARCHITECTURE: WASO (Wake after sleep onset) was 41.5 minutes, Stage N1 was 24 minutes, Stage N2 was 87.5 minutes, Stage N3 was 24.5 minutes and Stage R (REM sleep) was 0.5 minutes.  The percentages were Stage N1 17.6%, Stage N2 64.1%, Stage N3 17.9% and Stage R (REM sleep) was absent.  The arousals were noted as: 17 were spontaneous, 0 were associated with PLMs, 31 were  associated with respiratory events.  RESPIRATORY ANALYSIS:  There were a total of 133 respiratory events:  43 obstructive apneas, 0 central apneas and 0 mixed apneas with a total of 43 apneas and an apnea index (AI) of 18.9. There were 90 hypopneas with a hypopnea index of 39.6. The patient also had 0 respiratory event related arousals (RERAs).  Snoring was noted.     The total APNEA/HYPOPNEA INDEX (AHI) was 58.5 /hour and the total RESPIRATORY DISTURBANCE INDEX was 58.5 /hour.  0 events occurred in REM sleep and 180 events in NREM. The REM AHI was n/a, /hour versus a non-REM AHI of 58.5 /hour. The patient spent 220 minutes sleep time in the supine position 105 minutes in non-supine. The supine AHI was 104.8 /hour versus a non-supine AHI of 44.6 /hour.  OXYGEN SATURATION & C02:  The wake baseline 02 saturation was 94%, with the lowest being 81%. Time spent below 89% saturation equaled 68 minutes.  PERIODIC LIMB MOVEMENTS: The patient had a total of 0 Periodic Limb Movements.  The Periodic Limb Movement (PLM) index was 0 /hour and the PLM Arousal index was 0 /hour.  Audio and video analysis did not show any abnormal or unusual movements, behaviors, phonations or vocalizations. The patient took no bathroom breaks. Mild to moderate snoring was noted. The EKG was in keeping with normal sinus rhythm (NSR).   TITRATION STUDY WITH CPAP RESULTS:   The patient was fitted with a small N30i nasal mask. CPAP was initiated at 5 cmH20 with heated humidity per AASM split night standards and  pressure was advanced to 10 cmH20 because of hypopneas, apneas and desaturations.  At a PAP pressure of 10 cmH20, there was a reduction of the AHI to 0/hour with supine NREM sleep achieved and O2 nadir briefly at 87%.   Total recording time (TRT) was 254.5 minutes, with a total sleep time (TST) of 188.5 minutes. The patient's sleep latency was 39 minutes. REM latency was 65.5 minutes.  The sleep efficiency was 74.1 %.     SLEEP ARCHITECTURE: Wake after sleep was 24.5 minutes, Stage N1 11.5 minutes, Stage N2 94 minutes, Stage N3 31.5 minutes and Stage R (REM sleep) 51.5 minutes. The percentages were: Stage N1 6.1%, Stage N2 49.9%, Stage N3 16.7% and Stage R (REM sleep) 27.3%. The arousals were noted as: 13 were spontaneous, 0 were associated with PLMs, 1 were associated with respiratory events.  RESPIRATORY ANALYSIS:  There were a total of 7 respiratory events: 2 obstructive apneas, 3 central apneas and 0 mixed apneas with a total of 5 apneas and an apnea index (AI) of 1.6. There were 2 hypopneas with a hypopnea index of .6 /hour. The patient also had 0 respiratory event related arousals (RERAs).      The total APNEA/HYPOPNEA INDEX (AHI) was 2.2 /hour and the total RESPIRATORY DISTURBANCE INDEX was 2.2 /hour.  1 events occurred in REM sleep and 6 events in NREM. The REM AHI was 1.2 /hour versus a non-REM AHI of 2.6 /hour. REM sleep was achieved on a pressure of  cm/h2o (AHI was  .) The patient spent 100% of total sleep time in the supine position. The supine AHI was 2.2 /hour, versus a non-supine AHI of 0.0/hour.  OXYGEN SATURATION & C02:  The wake baseline 02 saturation was 95%, with the lowest being 84%. Time spent below 89% saturation equaled 32 minutes.  PERIODIC LIMB MOVEMENTS: The patient had a total of 0 Periodic Limb Movements. The Periodic Limb Movement (PLM) index was 0 /hour and the PLM Arousal index was 0 /hour.  Post-study, the patient indicated that sleep was better than usual.  IMPRESSION:  1. Obstructive Sleep Apnea (OSA)  2. Dysfunctions associated with sleep stages or arousals from sleep   RECOMMENDATIONS:  1. This patient has severe obstructive sleep apnea and responded well on CPAP therapy. Due to NREM sleep achieve on the final titration pressure of 10 cm and brief O2 nadir of 87%, I will recommend a home CPAP treatment pressure of 11 cm via small nasal mask with heated humidity. The patient  should be reminded to be fully compliant with PAP therapy to improve sleep related symptoms and decrease long term cardiovascular risks. Please note that untreated obstructive sleep apnea may carry additional perioperative morbidity. Patients with significant obstructive sleep apnea should receive perioperative PAP therapy and the surgeons and particularly the anesthesiologist should be informed of the diagnosis and the severity of the sleep disordered breathing. 2. This study shows sleep fragmentation and abnormal sleep stage percentages; these are nonspecific findings and per se do not signify an intrinsic sleep disorder or a cause for the patient's sleep-related symptoms. Causes include (but are not limited to) the first night effect of the sleep study, circadian rhythm disturbances, medication effect or an underlying mood disorder or medical problem.  3. The patient should be cautioned not to drive, work at heights, or operate dangerous or heavy equipment when tired or sleepy. Review and reiteration of good sleep hygiene measures should be pursued with any patient. 4. The patient will be seen  in follow-up in the sleep clinic at [facility] for discussion of the test results, symptom and treatment compliance review, further management strategies, etc. The referring provider will be notified of the test results.   I certify that I have reviewed the entire raw data recording prior to the issuance of this report in accordance with the Standards of Accreditation of the American Academy of Sleep Medicine (AASM)    Star Age, MD, PhD Diplomat, American Board of Neurology and Sleep Medicine (Neurology and Sleep Medicine)

## 2018-01-18 NOTE — Telephone Encounter (Signed)
See result note for split study: Pt seen on 9/19 and study on 10/4, sorry, put dates wrong in the result note.

## 2018-01-18 NOTE — Progress Notes (Signed)
Patient referred by Dr. Drema Dallas, seen by me on 12/10/17, split night sleep study on 01/17/18. Please call and notify patient that the recent sleep study confirmed the diagnosis of severe OSA. She did well with CPAP during the study with significant improvement of the respiratory events. Therefore, I would like start the patient on CPAP therapy at home by prescribing a machine for home use. I placed the order in the chart.  Please advise patient that we need a follow up appointment with either myself or one of our nurse practitioners in about 10 weeks post set-up to check for how the patient is feeling and how well the patient is using the machine, etc. Please go ahead and schedule the appointment, while you have the patient on the phone and make sure patient understands the importance of keeping this window for the FU appointment, as it is often an insurance requirement. Failing to adhere to this may result in losing coverage for sleep apnea treatment, at which point most patients are left with a choice of returning the machine or paying out of pocket (and we want neither of this to happen!).  Please re-enforce the importance of compliance with treatment and the need for [place] to monitor compliance data - again an insurance requirement and usually a good feedback for the patient as far as how they are doing.  Also remind patient, that any PAP machine or mask issues should be first addressed with the DME company, who provided the machine/mask.  Please ask if patient has a preference regarding DME company, may depend on the insurance too.  Please arrange for CPAP set up at home through a DME company of patient's choice.  Once you have spoken to the patient you can close the phone encounter. Please fax/route report to referring provider, thanks,   Star Age, MD, PhD [facility])

## 2018-01-18 NOTE — Patient Instructions (Signed)
Medication Instructions:  Your physician has recommended you make the following change in your medication:   INCREASE: lisinopril to 20 mg once a day  If you need a refill on your cardiac medications before your next appointment, please call your pharmacy.   Lab work: Please have your primary care doctor check a basic metabolic panel and fax [place] the results  If you have labs (blood work) drawn today and your tests are completely normal, you will receive your results only by: Marland Kitchen MyChart Message (if you have MyChart) OR . A paper copy in the mail If you have any lab test that is abnormal or we need to change your treatment, we will call you to review the results.  Testing/Procedures: None ordered  Follow-Up: At [facility], you and your health needs are our priority.  As part of our continuing mission to provide you with exceptional heart care, we have created designated Provider Care Teams.  These Care Teams include your primary Cardiologist (physician) and Advanced Practice Providers (APPs -  Physician Assistants and Nurse Practitioners) who all work together to provide you with the care you need, when you need it. . You will need a follow up appointment in 1 year.  Please call our office 2 months in advance to schedule this appointment.  You may see Casandra Doffing, MD or one of the following Advanced Practice Providers on your designated Care Team:   . Lyda Jester, PA-C . Dayna Dunn, PA-C . Ermalinda Barrios, PA-C  Any Other Special Instructions Will Be Listed Below (If Applicable).

## 2018-01-18 NOTE — Addendum Note (Signed)
Addended by: Star Age on: 01/18/2018 08:13 AM   Modules accepted: Orders

## 2018-01-18 NOTE — Telephone Encounter (Signed)
I called pt, spoke to pt's husband Richard, per DPR, and asked him to ask her to call me back.

## 2018-01-19 NOTE — Telephone Encounter (Signed)
I called pt. I advised pt that Dr. Rexene Alberts reviewed their sleep study results and found that pt has severe osa. Dr. Rexene Alberts recommends that pt start a cpap at home. I reviewed PAP compliance expectations with the pt. Pt is agreeable to starting a CPAP. I advised pt that an order will be sent to a DME, [facility], and [facility] will call the pt within about one week after they file with the pt's insurance. [facility] will show the pt how to use the machine, fit for masks, and troubleshoot the CPAP if needed. A follow up appt was made for insurance purposes with Dr. Rexene Alberts on 03/29/18 at 1:00 as previously scheduled. Pt verbalized understanding to arrive 15 minutes early and bring their CPAP. A letter with all of this information in it will be sent to the pt's mychart account as a reminder. I verified with the pt that the address we have on file is correct. Pt verbalized understanding of results. Pt had no questions at this time but was encouraged to call back if questions arise.

## 2018-03-19 ENCOUNTER — Telehealth: Payer: Self-pay | Admitting: Interventional Cardiology

## 2018-03-19 NOTE — Telephone Encounter (Signed)
° ° ° °  Patient calling to report dizziness. Requesting call from nurse   1) Are you dizzy now? No  2) Do you feel faint or have you passed out? No  3) Do you have any other symptoms? No  4) Have you checked your HR and BP (record if available)?Patient states her BP last night was 140/64 HR 63.

## 2018-03-19 NOTE — Telephone Encounter (Signed)
Returned call to patient who states that for the past 3-4 weeks she has been experiencing dizziness with standing only. She denies chest pain, palpitations, irregular heartbeats, syncope, or any additional Sx. Patient reports BP around 142/86 and HR 63. Patient taking all meds on med list as prescribed. Instructed the patient to stay hydrated and change positions slowly.

## 2018-03-29 ENCOUNTER — Encounter: Payer: Self-pay | Admitting: Neurology

## 2018-03-29 ENCOUNTER — Ambulatory Visit: Payer: Medicare Other | Admitting: Neurology

## 2018-03-29 VITALS — BP 138/78 | HR 76 | Ht 63.0 in | Wt 226.0 lb

## 2018-03-29 DIAGNOSIS — M542 Cervicalgia: Secondary | ICD-10-CM

## 2018-03-29 DIAGNOSIS — Z9989 Dependence on other enabling machines and devices: Secondary | ICD-10-CM

## 2018-03-29 DIAGNOSIS — G8929 Other chronic pain: Secondary | ICD-10-CM

## 2018-03-29 DIAGNOSIS — G4733 Obstructive sleep apnea (adult) (pediatric): Secondary | ICD-10-CM | POA: Diagnosis not present

## 2018-03-29 DIAGNOSIS — R51 Headache: Secondary | ICD-10-CM | POA: Diagnosis not present

## 2018-03-29 DIAGNOSIS — R519 Headache, unspecified: Secondary | ICD-10-CM

## 2018-03-29 NOTE — Progress Notes (Signed)
Subjective:    Patient ID: [name] is a 72 y.o. female.  HPI     Interim history:   [name] is a 72 year old right-handed woman with an underlying medical history of hypertension, prediabetes, vitamin D deficiency, coronary artery disease with status post stenting, anxiety, depression, hyperlipidemia, and obesity, who presents for follow-up consultation of her recurrent headaches and interim diagnosis of obstructive sleep apnea in the process of headache workup. The patient is unaccompanied today. I first met her on 12/31/2017 at the request of her primary care physician, at which time the patient reported new onset recurrent headaches since January 2019. She also reported neck pain. I suggested workup in the form of brain MRI without without contrast, cervical MRI without contrast and sleep study. She had a brain MRI with and without contrast and cervical spine MRI without contrast on 01/09/2018 and I reviewed the results: IMPRESSION: This MRI of the brain with and without contrast shows the following: 1.     T2/FLAIR hyperintense foci in the hemispheres, pons and right cerebellar hemisphere most consistent with moderately advanced chronic microvascular ischemic changes. 2.     There is a normal enhancement pattern and there are no acute findings.   IMPRESSION: This MRI of the cervical spine without contrast shows the following: 1.    The spinal cord appears normal. 2.    Mild multilevel degenerative changes as detailed above.    The worst levels are C4-C5.  There is moderate left foraminal narrowing and at C5-C6 where there is borderline spinal stenosis and mild to moderate foraminal narrowing.    There does not appear to be nerve root compression. 3.    White matter changes in the pons consistent with chronic microvascular ischemic change.  This is evaluated further on the MRI of the brain also performed today. 4.    There are no acute findings.  We called her in the interim with  her scan results.  She had a split-night sleep study on 01/15/2018 and I reviewed the results with her in detail. Baseline sleep efficiency was 67.4%, sleep latency 21 minutes, REM sleep was absent prior to starting CPAP. She had a total AHI of 58.5 per hour, supine AHI was 104.8 per hour. Average oxygen saturation was 94%, nadir in non-REM sleep was 81%. She had no significant PLMS. She was fitted with a nasal interface and CPAP was titrated from 5 cm to 10 cm. On the final pressure her AHI was 0 per hour but only supine non-REM sleep was achieved and O2 nadir briefly at 87%. She did achieve a significant percentage of REM sleep during the second part of the study at 27.3%. Based on her test results I suggested a home CPAP treatment pressure of 11 cm.  Today, 03/29/2018: I reviewed her CPAP compliance data from 02/23/2018 through 03/24/2018 which is a total of 30 days, during which time she used her CPAP every night with percent used days greater than 4 hours at 100%, indicating superb compliance with an average usage of 8 hours, residual AHI at goal at 2.8 per hour, leak on the higher end with the 95th percentile at 23.4 L/m on a pressure of 11 cm with EPR of 3. She reports being compliant with CPAP, sleep quality sleep consolidation are improved but her headaches are about the same. She reports recurrent left posterior headaches and significant neck pain and limitation to neck turns. She has been using a soft neck collar at night. She is  supposed to see Dr. Maryjean Tran soon. She has been changing her supplies on a regular basis including filter and nasal pillows. She is motivated to continue with treatment.  The patient's allergies, current medications, family history, past medical history, past social history, past surgical history and problem list were reviewed and updated as appropriate.   Previously:   12/31/2017: (She) reports recent onset of headaches in January 2019. Headaches came on gradually and  are located in the right posterior area. She had years ago a sebaceous cyst removed which was rather large and in the same area. She feels like the headache is in the scar area. The scar is not actually bothering her as such. She cannot pinpoint a very small area, it is a achy headache. She has no associated photophobia or nausea or vomiting, its typically not throbbing but more constant. She has had neck pain on the right side without any significant radiation. She has occasional left arm tingling but no radiating pain on the left. She has otherwise no one-sided weakness or numbness, no facial symptoms nor slurring of speech. She has no headache history in the past. She has never had an MRI. Of note, she did get a single coronary stent placed in October 1999. She does not report any significant snoring but does have sleep disturbance particularly difficulty maintaining sleep. She is in bed around 1 AM typically, falls asleep okay but wakes up around 5. She has nocturia about once per average night. She has difficulty maintaining sleep after that. She often lays in bed and falls asleep around 8 AM and sleeps till noon typically. She also takes an afternoon nap of about 30-45 minutes. Sometimes she sleeps longer than that. She has noticed clicking sensation or sounds in her neck. She feels that she has reduced neck mobility. She is retired and lives with her husband, they have 2 children. She is a nonsmoker and drinks alcohol very occasionally, soda about 1-2 servings per day on average. She reports right ear fullness. She was told that she had mild inflammation type changes in her ear canal, no ringing in the ear reported. I reviewed your office records, which you kindly included. You ordered a head CT. She had a head CT without contrast on 10/22/2017 and I reviewed the results: Impression: Normal head CT. She has never had a sleep study but her cardiologist recommended that she pursue a sleep study.for her  headaches she has not really tried anything, other than taking her typical dose of Tylenol which she takes daily for arthritis.  Her Past Medical History Is Significant For: Past Medical History:  Diagnosis Date  . Anxiety   . Arthritis   . Bone spur   . Coronary artery disease    stent - 1999  . Depression   . Fibrocystic breast changes   . GERD (gastroesophageal reflux disease)   . Glucosuria   . Hernia, inguinal, left 10/2002  . Hypertension   . Melanoma ([place]) 10/2008   right shoulder and arm  . Menorrhagia   . Myocardial infarction [facility]) 1999    Her Past Surgical History Is Significant For: Past Surgical History:  Procedure Laterality Date  . ANGIOPLASTY  2000  . APPENDECTOMY    . bone chip removed from left foot     . CHOLECYSTECTOMY    . COLONOSCOPY WITH PROPOFOL N/A 03/03/2017   Procedure: COLONOSCOPY WITH PROPOFOL;  Surgeon: Juanita Craver, MD;  Location: [facility] ENDOSCOPY;  Service: Endoscopy;  Laterality: N/A;  .  CORONARY ANGIOPLASTY WITH STENT PLACEMENT  10/99  . ESOPHAGOGASTRODUODENOSCOPY (EGD) WITH PROPOFOL N/A 03/03/2017   Procedure: ESOPHAGOGASTRODUODENOSCOPY (EGD) WITH PROPOFOL;  Surgeon: Juanita Craver, MD;  Location: [facility] ENDOS[facility];  Service: Endoscopy;  Laterality: N/A;  . HERNIA REPAIR     umbilical   . HYSTEROSCOPY  2/98   D&C (polyps)  . LEFT HEART CATHETERIZATION WITH CORONARY ANGIOGRAM N/A 01/25/2014   Procedure: LEFT HEART CATHETERIZATION WITH CORONARY ANGIOGRAM;  Surgeon: Jettie Booze, MD;  Location: [facility] CATH LAB;  Service: Cardiovascular;  Laterality: N/A;  . NODE DISSECTION     neg  . TOTAL ABDOMINAL HYSTERECTOMY     LSO     Failed TVH    Her Family History Is Significant For: Family History  Problem Relation Age of Onset  . Diabetes Mother   . Stroke Mother   . Hypertension Mother   . Heart disease Father   . Hypertension Father   . Cancer Father        pancreatic cancer  . Heart attack Father     Her Social History Is Significant  For: Social History   Socioeconomic History  . Marital status: Married    Spouse name: Not on file  . Number of children: Not on file  . Years of education: Not on file  . Highest education level: Not on file  Occupational History  . Not on file  Social Needs  . Financial resource strain: Not on file  . Food insecurity:    Worry: Not on file    Inability: Not on file  . Transportation needs:    Medical: Not on file    Non-medical: Not on file  Tobacco Use  . Smoking status: Never Smoker  . Smokeless tobacco: Never Used  Substance and Sexual Activity  . Alcohol use: Yes    Alcohol/week: 1.0 standard drinks    Types: 1 Standard drinks or equivalent per week    Comment: occ glass of wine  . Drug use: No    Frequency: 5.0 times per week  . Sexual activity: Yes    Partners: Male    Birth control/protection: Post-menopausal, Surgical    Comment: TAH/LSO  Lifestyle  . Physical activity:    Days per week: Not on file    Minutes per session: Not on file  . Stress: Not on file  Relationships  . Social connections:    Talks on phone: Not on file    Gets together: Not on file    Attends religious service: Not on file    Active member of club or organization: Not on file    Attends meetings of clubs or organizations: Not on file    Relationship status: Not on file  Other Topics Concern  . Not on file  Social History Narrative  . Not on file    Her Allergies Are:  Allergies  Allergen Reactions  . Azithromycin Swelling  . Other Itching, Swelling, Other (See Comments) and Cough    Horse products  . Penicillins Swelling    Has patient had a PCN reaction causing immediate rash, facial/tongue/throat swelling, SOB or lightheadedness with hypotension: Yes Has patient had a PCN reaction causing severe rash involving mucus membranes or skin necrosis: No Has patient had a PCN reaction that required hospitalization: No Has patient had a PCN reaction occurring within the last 10  years: No If all of the above answers are "NO", then may proceed with Cephalosporin use.   . Tetanus Toxoids Swelling  .  Gadolinium Derivatives Hives and Itching    Pt stated that her left arm was itching and I noticed that she had two hives on her chest. No difficulty breathing, sneezing. One 46m of benadryl was ordered by Dr. WJimmye Norman Pt remained at facility and was monitored before going home. -ldrake  . Morphine And Related Nausea And Vomiting  . Augmentin [Amoxicillin-Pot Clavulanate] Rash    Has patient had a PCN reaction causing immediate rash, facial/tongue/throat swelling, SOB or lightheadedness with hypotension: Yes Has patient had a PCN reaction causing severe rash involving mucus membranes or skin necrosis: Yes Has patient had a PCN reaction that required hospitalization:No Has patient had a PCN reaction occurring within the last 10 years: No If all of the above answers are "NO", then may proceed with Cephalosporin use.   :   Her Current Medications Are:  Outpatient Encounter Medications as of 03/29/2018  Medication Sig  . acetaminophen (TYLENOL 8 HOUR ARTHRITIS PAIN) 650 MG CR tablet Take 1,300 mg every 8 (eight) hours as needed by mouth for pain.  .Marland KitchenamLODipine (NORVASC) 10 MG tablet Take 10 mg at bedtime by mouth.   .Marland Kitchenatorvastatin (LIPITOR) 10 MG tablet Take 1 tablet (10 mg total) by mouth daily.  . Fish Oil-Cholecalciferol (OMEGA-3 + VITAMIN D3 PO) Take 1 tablet daily by mouth. 1280 mg-1000 I.U.  . hydrochlorothiazide (HYDRODIURIL) 25 MG tablet Take 25 mg by mouth daily.  . isosorbide mononitrate (IMDUR) 60 MG 24 hr tablet Take 1 tablet (60 mg total) by mouth daily. Please keep upcoming appointment for further refills  . lisinopril (PRINIVIL,ZESTRIL) 20 MG tablet Take 1 tablet (20 mg total) by mouth daily.  . Multiple Vitamin (MULTIVITAMIN WITH MINERALS) TABS tablet Take 1 tablet at bedtime by mouth.  . nitroGLYCERIN (NITROSTAT) 0.4 MG SL tablet DISSOLVE 1 TABLET UNDER THE  TONGUE EVERY 5 MINUTES AS NEEDED FOR CHEST PAIN  . potassium chloride SA (K-DUR,KLOR-CON) 20 MEQ tablet Take 1 tablet (20 mEq total) by mouth 2 (two) times daily.  . ranitidine (ZANTAC) 300 MG tablet Take 300 mg at bedtime by mouth.  . sertraline (ZOLOFT) 50 MG tablet Take 150 mg by mouth at bedtime.  . Soft Lens Products (REWETTING DROPS) SOLN Place 1 drop 3 (three) times daily as needed into both eyes (for dry/irritated contact lenses).  . metoprolol (LOPRESSOR) 50 MG tablet Take 1 tablet (50 mg total) by mouth 2 (two) times daily. Decrease in dose   No facility-administered encounter medications on file as of 03/29/2018.   :  Review of Systems:  Out of a complete 14 point review of systems, all are reviewed and negative with the exception of these symptoms as listed below: Review of Systems  Neurological:       Pt presents today to discuss her cpap. Pt sleeps well with the cpap but admits that she does not like it. Pt is still having headaches.    Objective:  Neurological Exam  Physical Exam Physical Examination:   Vitals:   03/29/18 1255  BP: 138/78  Pulse: 76   General Examination: The patient is a very pleasant 72y.o. female in no acute distress. She appears well-developed and well-nourished and well groomed.   HEENT: Normocephalic, atraumatic, pupils are equal, round and reactive to light and accommodation. Right eyelid is a little droopy, not new and stable from last time. Hearing seems mildly impaired perhaps. TM clear b/l. Speech is clear, facial symmetry is normal. Airway examination reveals moderate airway crowding. Tongue protrudes centrally  and palate elevates symmetrically. she has limitation in neck turns to the sides.  Chest: Clear to auscultation without wheezing, rhonchi or crackles noted.  Heart: S1+S2+0, regular and normal without murmurs, rubs or gallops noted.   Abdomen: Soft, non-tender and non-distended with normal bowel sounds appreciated on  auscultation.  Extremities: There is trace pitting edema in the distal lower extremities bilaterally.   Skin: Warm and dry without trophic changes noted.   Musculoskeletal: exam reveals arthritic changes in both hands, she also reports bilateral foot pain and knee pain.   Neurologically:  Mental status: The patient is awake, alert and oriented in all 4 spheres. Her immediate and remote memory, attention, language skills and fund of knowledge are appropriate. There is no evidence of aphasia, agnosia, apraxia or anomia. Speech is clear with normal prosody and enunciation. Thought process is linear. Mood is normal and affect is normal.  Cranial nerves II - XII are as described above under HEENT exam.  Motor exam: Normal bulk, strength and tone is noted. There is no drift, tremor or rebound. Romberg is negative. Reflexes are 1+ throughout, incl ankles. Fine motor skills and coordination: intact with normal finger taps, normal hand movements, normal rapid alternating patting, normal foot taps and normal foot agility.  Cerebellar testing: No dysmetria or intention tremor. There is no truncal or gait ataxia.  Sensory exam: intact to light touch in the upper and lower extremities.  Gait, station and balance: She stands with difficulty and pushes herself up. She stands slightly wide-based. She walks with a slight limp on the right.  Assessment and Plan:   In summary, [name] is a very pleasant 72 year old female with an underlying medical history of hypertension, prediabetes, vitamin D deficiency, coronary artery disease with status post stenting, anxiety, depression, hyperlipidemia, and obesity, who presents for Follow-up consultation of her recurrent headaches and severe sleep apnea. She has a history of neck pain and did have degenerative changes in her neck MRI. She is supposed to see Dr. Maryjean Tran soon. Her sleep study in October 2019 showed severe sleep apnea. She responded well to CPAP  therapy. She is fully compliant with treatment and commended for this. She has noticed an improvement in her sleep consolidation sleep quality but not so much in her headache. She describes a non-migrainous headache which is not debilitating per her report but she has had right posterior headaches, and has had neck soreness for which she has been using a soft neck collar at night. We talked about her sleep study results and her MRI results today. We also reviewed her compliance data together. She is advised to follow-up routinely in 6 months, sooner if needed and encouraged to be fully compliant with her CPAP. Her brain MRI showed moderate microvascular changes, no acute findings, no abnormal enhancement. I answered all their questions today and the patient and her husband were in agreement. I spent 30 minutes in total face-to-face time with the patient, more than 50% of which was spent in counseling and coordination of care, reviewing test results, reviewing medication and discussing or reviewing the diagnosis of HAs, OSA, neck pain, its prognosis and treatment options. Pertinent laboratory and imaging test results that were available during this visit with the patient were reviewed by me and considered in my medical decision making (see chart for details).

## 2018-03-29 NOTE — Patient Instructions (Signed)
If you need new supplies, please call [facility] for replacement items.  Please continue using your CPAP regularly. While your insurance requires that you use CPAP at least 4 hours each night on 70% of the nights, I recommend, that you not skip any nights and use it throughout the night if you can. Getting used to CPAP and staying with the treatment long term does take time and patience and discipline. Untreated obstructive sleep apnea when it is moderate to severe can have an adverse impact on cardiovascular health and raise her risk for heart disease, arrhythmias, hypertension, congestive heart failure, stroke and diabetes. Untreated obstructive sleep apnea causes sleep disruption, nonrestorative sleep, and sleep deprivation. This can have an impact on your day to day functioning and cause daytime sleepiness and impairment of cognitive function, memory loss, mood disturbance, and problems focussing. Using CPAP regularly can improve these symptoms. Keep up the good work! I will see you back in 6 months for sleep apnea check up, and if you continue to do well on CPAP I will see you once a year thereafter.  Your MRI brain showed no acute findings and neck MRI did show degenerative changes. It is a good idea to see Dr. Maryjean Ka.

## 2018-06-18 ENCOUNTER — Telehealth: Payer: Self-pay

## 2018-06-18 NOTE — Telephone Encounter (Signed)
Notes on file.

## 2018-07-08 NOTE — Progress Notes (Deleted)
{Choose 1 Note Type (Telehealth Visit or Telephone Visit):[phone]}  Evaluation Performed:  Follow-up visit  This visit type was conducted due to national recommendations for restrictions regarding the COVID-19 Pandemic (e.g. social distancing).  This format is felt to be most appropriate for this patient at this time.  All issues noted in this document were discussed and addressed.  No physical exam was performed (except for noted visual exam findings with Video Visits).  Please refer to the patient's chart (MyChart message for video visits and phone note for telephone visits) for the patient's consent to telehealth for [facility].  Date:  07/08/2018   ID:  [name], DOB Sep 07, 1945, MRN [id number]  Patient Location:  [place] [address]   Provider location:   [place],  [place], [place]  PCP:  Leighton Ruff, MD  Cardiologist:  Larae Grooms, MD  Electrophysiologist:  None   Chief Complaint:  ***  History of Present Illness:    [name] is a 73 y.o. female who presents via audio/video conferencing for a telehealth visit today.  ***  Pt has hx of CAD s/p PCI in 1999 and 2002. She had cath without obstruction in 2015. She was visiting in [place], [place] and was hospitalized. She was told to f/u with her cardiologist when she got back to [place].     The patient {does/does not:200015} symptoms concerning for COVID-19 infection (fever, chills, cough, or new SHORTNESS OF BREATH).    Prior CV studies:   The following studies were reviewed today:  Cardiac cath 01/25/2014 IMPRESSIONS: 1. Widely patent left main coronary artery. 2. Patent stents in the mid left anterior descending artery with only mild in-stent restenosis. Patent large diagonal branch. 3. Mild disease in the left circumflex artery and its branches. 4. Mild scattered disease in the right coronary artery. 5. Normal left ventricular systolic function.  LVEDP 10  mmHg.  Ejection fraction 55%.  RECOMMENDATION:  Medical therapy.  Continue aggressive secondary prevention.     Past Medical History:  Diagnosis Date  . Anxiety   . Arthritis   . Bone spur   . Coronary artery disease    stent - 1999  . Depression   . Fibrocystic breast changes   . GERD (gastroesophageal reflux disease)   . Glucosuria   . Hernia, inguinal, left 10/2002  . Hypertension   . Melanoma ([place]) 10/2008   right shoulder and arm  . Menorrhagia   . Myocardial infarction ([place]) 1999   Past Surgical History:  Procedure Laterality Date  . ANGIOPLASTY  2000  . APPENDECTOMY    . bone chip removed from left foot     . CHOLECYSTECTOMY    . COLONOSCOPY WITH PROPOFOL N/A 03/03/2017   Procedure: COLONOSCOPY WITH PROPOFOL;  Surgeon: Juanita Craver, MD;  Location: [facility];  Service: Endoscopy;  Laterality: N/A;  . CORONARY ANGIOPLASTY WITH STENT PLACEMENT  10/99  . ESOPHAGOGASTRODUODENOSCOPY (EGD) WITH PROPOFOL N/A 03/03/2017   Procedure: ESOPHAGOGASTRODUODENOSCOPY (EGD) WITH PROPOFOL;  Surgeon: Juanita Craver, MD;  Location: [facility];  Service: Endoscopy;  Laterality: N/A;  . HERNIA REPAIR     umbilical   . HYSTEROSCOPY  2/98   D&C (polyps)  . LEFT HEART CATHETERIZATION WITH CORONARY ANGIOGRAM N/A 01/25/2014   Procedure: LEFT HEART CATHETERIZATION WITH CORONARY ANGIOGRAM;  Surgeon: Jettie Booze, MD;  Location: [facility] CATH LAB;  Service: Cardiovascular;  Laterality: N/A;  . NODE DISSECTION     neg  . TOTAL ABDOMINAL  HYSTERECTOMY     LSO     Failed TVH     No outpatient medications have been marked as taking for the 07/09/18 encounter (Appointment) with Daune Perch, NP.     Allergies:   Azithromycin; Other; Penicillins; Tetanus toxoids; Gadolinium derivatives; Morphine and related; and Augmentin [amoxicillin-pot clavulanate]   Social History   Tobacco Use  . Smoking status: Never Smoker  . Smokeless tobacco: Never Used  Substance Use Topics  . Alcohol use:  Yes    Alcohol/week: 1.0 standard drinks    Types: 1 Standard drinks or equivalent per week    Comment: occ glass of wine  . Drug use: No    Frequency: 5.0 times per week     Family Hx: The patient's family history includes Cancer in her father; Diabetes in her mother; Heart attack in her father; Heart disease in her father; Hypertension in her father and mother; Stroke in her mother.  ROS:   Please see the history of present illness.    All other systems reviewed and are negative.   Labs/Other Tests and Data Reviewed:    Recent Labs: 12/31/2017: ALT 23; BUN 17; Creatinine, Ser 0.70; Potassium 4.0; Sodium 143   Recent Lipid Panel No results found for: CHOL, TRIG, HDL, CHOLHDL, LDLCALC, LDLDIRECT  Wt Readings from Last 3 Encounters:  03/29/18 226 lb (102.5 kg)  01/18/18 217 lb 9.6 oz (98.7 kg)  12/31/17 220 lb (99.8 kg)     Exam:    Vital Signs:  There were no vitals taken for this visit.   Well nourished, well developed female in no*** acute distress. ***  ASSESSMENT & PLAN:    CAD -Remote hx of PCI in 1999 and 2002. She had a cath without obstructive disease in 2015. -Medical therapy includes BB, long activn nitrate, statin. Not on aspirin.   Hypertension -On amlodipine 10 mg, HCTZ 25 mg, lisinopril 20 mg, metoprolol 50 mg BID -last labs in 12/2017 with normal renal function and potassium. Will need labs at next visit. Will not do at this time due to COVID 19 restrictions.   Hyperlipidemia -On atorvastatin 10 mg and fish oil. LDL 79 on 02/10/2018.  -Continue current therapy.    COVID-19 Education: The signs and symptoms of COVID-19 were discussed with the patient and how to seek care for testing (follow up with PCP or arrange E-visit).  ***The importance of social distancing was discussed today.  Patient Risk:   After full review of this patients clinical status, I feel that they are at least moderate risk at this time.  Time:   Today, I have spent ***  minutes with the patient with telehealth technology discussing ***.     Medication Adjustments/Labs and Tests Ordered: Current medicines are reviewed at length with the patient today.  Concerns regarding medicines are outlined above.  Tests Ordered: No orders of the defined types were placed in this encounter.  Medication Changes: No orders of the defined types were placed in this encounter.   Disposition:  {follow up:15908}  Signed, Daune Perch, NP  07/08/2018 2:37 PM    [facility]

## 2018-07-09 ENCOUNTER — Encounter (HOSPITAL_COMMUNITY): Payer: Self-pay

## 2018-07-09 ENCOUNTER — Other Ambulatory Visit: Payer: Self-pay

## 2018-07-09 ENCOUNTER — Inpatient Hospital Stay (HOSPITAL_COMMUNITY)
Admission: EM | Admit: 2018-07-09 | Discharge: 2018-07-12 | DRG: 293 | Disposition: A | Discharge status: Home / Self Care | Payer: Medicare Other | Attending: Family Medicine | Admitting: Family Medicine

## 2018-07-09 ENCOUNTER — Emergency Department (HOSPITAL_COMMUNITY): Payer: Medicare Other

## 2018-07-09 ENCOUNTER — Telehealth: Payer: Medicare Other | Admitting: Cardiology

## 2018-07-09 DIAGNOSIS — J069 Acute upper respiratory infection, unspecified: Secondary | ICD-10-CM | POA: Diagnosis present

## 2018-07-09 DIAGNOSIS — Z887 Allergy status to serum and vaccine status: Secondary | ICD-10-CM

## 2018-07-09 DIAGNOSIS — J449 Chronic obstructive pulmonary disease, unspecified: Secondary | ICD-10-CM | POA: Diagnosis present

## 2018-07-09 DIAGNOSIS — Z90710 Acquired absence of both cervix and uterus: Secondary | ICD-10-CM

## 2018-07-09 DIAGNOSIS — I5031 Acute diastolic (congestive) heart failure: Secondary | ICD-10-CM | POA: Diagnosis not present

## 2018-07-09 DIAGNOSIS — R6889 Other general symptoms and signs: Secondary | ICD-10-CM

## 2018-07-09 DIAGNOSIS — Z823 Family history of stroke: Secondary | ICD-10-CM

## 2018-07-09 DIAGNOSIS — M79671 Pain in right foot: Secondary | ICD-10-CM | POA: Diagnosis present

## 2018-07-09 DIAGNOSIS — R0902 Hypoxemia: Secondary | ICD-10-CM | POA: Diagnosis present

## 2018-07-09 DIAGNOSIS — Z888 Allergy status to other drugs, medicaments and biological substances status: Secondary | ICD-10-CM

## 2018-07-09 DIAGNOSIS — F419 Anxiety disorder, unspecified: Secondary | ICD-10-CM | POA: Diagnosis present

## 2018-07-09 DIAGNOSIS — Z7901 Long term (current) use of anticoagulants: Secondary | ICD-10-CM | POA: Diagnosis not present

## 2018-07-09 DIAGNOSIS — Z800 Family history of malignant neoplasm of digestive organs: Secondary | ICD-10-CM

## 2018-07-09 DIAGNOSIS — I10 Essential (primary) hypertension: Secondary | ICD-10-CM | POA: Diagnosis present

## 2018-07-09 DIAGNOSIS — I2583 Coronary atherosclerosis due to lipid rich plaque: Secondary | ICD-10-CM | POA: Diagnosis not present

## 2018-07-09 DIAGNOSIS — Z85820 Personal history of malignant melanoma of skin: Secondary | ICD-10-CM | POA: Diagnosis not present

## 2018-07-09 DIAGNOSIS — I2510 Atherosclerotic heart disease of native coronary artery without angina pectoris: Secondary | ICD-10-CM

## 2018-07-09 DIAGNOSIS — Z9049 Acquired absence of other specified parts of digestive tract: Secondary | ICD-10-CM

## 2018-07-09 DIAGNOSIS — M1990 Unspecified osteoarthritis, unspecified site: Secondary | ICD-10-CM | POA: Diagnosis present

## 2018-07-09 DIAGNOSIS — H5500 Unspecified nystagmus: Secondary | ICD-10-CM | POA: Diagnosis present

## 2018-07-09 DIAGNOSIS — E876 Hypokalemia: Secondary | ICD-10-CM | POA: Diagnosis present

## 2018-07-09 DIAGNOSIS — Z833 Family history of diabetes mellitus: Secondary | ICD-10-CM

## 2018-07-09 DIAGNOSIS — K219 Gastro-esophageal reflux disease without esophagitis: Secondary | ICD-10-CM | POA: Diagnosis present

## 2018-07-09 DIAGNOSIS — F329 Major depressive disorder, single episode, unspecified: Secondary | ICD-10-CM | POA: Diagnosis present

## 2018-07-09 DIAGNOSIS — Z955 Presence of coronary angioplasty implant and graft: Secondary | ICD-10-CM | POA: Diagnosis not present

## 2018-07-09 DIAGNOSIS — R5381 Other malaise: Secondary | ICD-10-CM | POA: Diagnosis present

## 2018-07-09 DIAGNOSIS — R509 Fever, unspecified: Secondary | ICD-10-CM | POA: Diagnosis present

## 2018-07-09 DIAGNOSIS — I252 Old myocardial infarction: Secondary | ICD-10-CM

## 2018-07-09 DIAGNOSIS — H8110 Benign paroxysmal vertigo, unspecified ear: Secondary | ICD-10-CM | POA: Diagnosis present

## 2018-07-09 DIAGNOSIS — Z20828 Contact with and (suspected) exposure to other viral communicable diseases: Secondary | ICD-10-CM | POA: Diagnosis present

## 2018-07-09 DIAGNOSIS — G4733 Obstructive sleep apnea (adult) (pediatric): Secondary | ICD-10-CM | POA: Diagnosis present

## 2018-07-09 DIAGNOSIS — Z8249 Family history of ischemic heart disease and other diseases of the circulatory system: Secondary | ICD-10-CM

## 2018-07-09 DIAGNOSIS — Z9114 Patient's other noncompliance with medication regimen: Secondary | ICD-10-CM | POA: Diagnosis not present

## 2018-07-09 DIAGNOSIS — Z880 Allergy status to penicillin: Secondary | ICD-10-CM

## 2018-07-09 DIAGNOSIS — Z79899 Other long term (current) drug therapy: Secondary | ICD-10-CM

## 2018-07-09 DIAGNOSIS — I110 Hypertensive heart disease with heart failure: Secondary | ICD-10-CM | POA: Diagnosis not present

## 2018-07-09 DIAGNOSIS — I5033 Acute on chronic diastolic (congestive) heart failure: Secondary | ICD-10-CM | POA: Diagnosis present

## 2018-07-09 DIAGNOSIS — D649 Anemia, unspecified: Secondary | ICD-10-CM | POA: Diagnosis present

## 2018-07-09 DIAGNOSIS — Z20822 Contact with and (suspected) exposure to covid-19: Secondary | ICD-10-CM

## 2018-07-09 DIAGNOSIS — I5032 Chronic diastolic (congestive) heart failure: Secondary | ICD-10-CM

## 2018-07-09 DIAGNOSIS — I509 Heart failure, unspecified: Secondary | ICD-10-CM

## 2018-07-09 DIAGNOSIS — M79672 Pain in left foot: Secondary | ICD-10-CM

## 2018-07-09 DIAGNOSIS — I4891 Unspecified atrial fibrillation: Secondary | ICD-10-CM | POA: Diagnosis present

## 2018-07-09 DIAGNOSIS — F32A Depression, unspecified: Secondary | ICD-10-CM | POA: Diagnosis present

## 2018-07-09 LAB — RESPIRATORY PANEL BY PCR

## 2018-07-09 LAB — COMPREHENSIVE METABOLIC PANEL
ALT: 31 U/L (ref 0–44)
AST: 24 U/L (ref 15–41)
Albumin: 3.3 g/dL — ABNORMAL LOW (ref 3.5–5.0)
Alkaline Phosphatase: 74 U/L (ref 38–126)
Anion gap: 10 (ref 5–15)
BUN: 9 mg/dL (ref 8–23)
CO2: 23 mmol/L (ref 22–32)
Calcium: 9.1 mg/dL (ref 8.9–10.3)
Chloride: 109 mmol/L (ref 98–111)
Creatinine, Ser: 0.94 mg/dL (ref 0.44–1.00)
GFR calc Af Amer: >60 mL/min (ref >60)
GFR calc non Af Amer: >60 mL/min (ref >60)
Glucose, Bld: 145 mg/dL — ABNORMAL HIGH (ref 70–99)
Potassium: 3.1 mmol/L — ABNORMAL LOW (ref 3.5–5.1)
Sodium: 142 mmol/L (ref 135–145)
Total Bilirubin: 1.2 mg/dL (ref 0.3–1.2)
Total Protein: 6 g/dL — ABNORMAL LOW (ref 6.5–8.1)

## 2018-07-09 LAB — CBC WITH DIFFERENTIAL/PLATELET
Abs Immature Granulocytes: 0.04 10*3/uL (ref 0.00–0.07)
Basophils Absolute: 0.1 10*3/uL (ref 0.0–0.1)
Basophils Relative: 1 %
Eosinophils Absolute: 0.1 10*3/uL (ref 0.0–0.5)
Eosinophils Relative: 1 %
HCT: 34.1 % — ABNORMAL LOW (ref 36.0–46.0)
Hemoglobin: 11.1 g/dL — ABNORMAL LOW (ref 12.0–15.0)
Immature Granulocytes: 1 %
Lymphocytes Relative: 16 %
Lymphs Abs: 1.4 10*3/uL (ref 0.7–4.0)
MCH: 30.7 pg (ref 26.0–34.0)
MCHC: 32.6 g/dL (ref 30.0–36.0)
MCV: 94.5 fL (ref 80.0–100.0)
Monocytes Absolute: 0.5 10*3/uL (ref 0.1–1.0)
Monocytes Relative: 6 %
Neutro Abs: 6.6 10*3/uL (ref 1.7–7.7)
Neutrophils Relative %: 75 %
Platelets: 179 10*3/uL (ref 150–400)
RBC: 3.61 MIL/uL — ABNORMAL LOW (ref 3.87–5.11)
RDW: 13.5 % (ref 11.5–15.5)
WBC: 8.7 10*3/uL (ref 4.0–10.5)
nRBC: 0 % (ref 0.0–0.2)

## 2018-07-09 LAB — D-DIMER, QUANTITATIVE: D-Dimer, Quant: 1.35 ug/mL-FEU — ABNORMAL HIGH (ref 0.00–0.50)

## 2018-07-09 LAB — LACTIC ACID, PLASMA: Lactic Acid, Venous: 0.9 mmol/L (ref 0.5–1.9)

## 2018-07-09 LAB — BRAIN NATRIURETIC PEPTIDE: B Natriuretic Peptide: 282.1 pg/mL — ABNORMAL HIGH (ref 0.0–100.0)

## 2018-07-09 LAB — C-REACTIVE PROTEIN: CRP: <0.8 mg/dL (ref <1.0)

## 2018-07-09 LAB — PROCALCITONIN: Procalcitonin: <0.1 ng/mL

## 2018-07-09 LAB — SEDIMENTATION RATE: Sed Rate: 37 mm/hr — ABNORMAL HIGH (ref 0–22)

## 2018-07-09 LAB — TROPONIN I: Troponin I: <0.03 ng/mL (ref <0.03)

## 2018-07-09 LAB — FERRITIN: Ferritin: 80 ng/mL (ref 11–307)

## 2018-07-09 MED ORDER — HEPARIN SODIUM (PORCINE) 5000 UNIT/ML IJ SOLN: 5000.0000 [IU] | Freq: Three times a day (TID) | INTRAMUSCULAR | Status: DC

## 2018-07-09 MED ORDER — FLUTICASONE PROPIONATE 50 MCG/ACT NA SUSP: 2.0000 | Freq: Every day | NASAL | Status: DC | PRN

## 2018-07-09 MED ORDER — FUROSEMIDE 10 MG/ML IJ SOLN
40.0000 mg | Freq: Once | INTRAMUSCULAR | Status: AC
  Administered 2018-07-09: 40 mg via INTRAVENOUS
  Filled 2018-07-09: qty 4

## 2018-07-09 MED ORDER — CLONIDINE HCL 0.1 MG PO TABS
0.1000 mg | ORAL_TABLET | Freq: Two times a day (BID) | ORAL | Status: DC
  Administered 2018-07-09 – 2018-07-12 (×6): 0.1 mg via ORAL
  Filled 2018-07-09 (×7): qty 1

## 2018-07-09 MED ORDER — POTASSIUM CHLORIDE 10 MEQ/100ML IV SOLN
10.0000 meq | INTRAVENOUS | Status: AC
  Administered 2018-07-09 (×2): 10 meq via INTRAVENOUS
  Filled 2018-07-09: qty 100

## 2018-07-09 MED ORDER — IOHEXOL 350 MG/ML SOLN
75.0000 mL | Freq: Once | INTRAVENOUS | Status: AC | PRN
  Administered 2018-07-09: 75 mL via INTRAVENOUS

## 2018-07-09 MED ORDER — ATORVASTATIN CALCIUM 10 MG PO TABS
10.0000 mg | ORAL_TABLET | Freq: Every day | ORAL | Status: DC
  Administered 2018-07-10 – 2018-07-11 (×2): 10 mg via ORAL
  Filled 2018-07-09 (×3): qty 1

## 2018-07-09 MED ORDER — FUROSEMIDE 40 MG PO TABS
40.0000 mg | ORAL_TABLET | Freq: Two times a day (BID) | ORAL | Status: DC
  Administered 2018-07-09: 40 mg via ORAL
  Filled 2018-07-09 (×2): qty 1

## 2018-07-09 MED ORDER — POTASSIUM CHLORIDE CRYS ER 20 MEQ PO TBCR
40.0000 meq | EXTENDED_RELEASE_TABLET | Freq: Two times a day (BID) | ORAL | Status: DC
  Administered 2018-07-09: 40 meq via ORAL
  Filled 2018-07-09 (×2): qty 2

## 2018-07-09 MED ORDER — ALBUTEROL SULFATE HFA 108 (90 BASE) MCG/ACT IN AERS
1.0000 | INHALATION_SPRAY | RESPIRATORY_TRACT | Status: DC | PRN
  Filled 2018-07-09: qty 6.7

## 2018-07-09 MED ORDER — POTASSIUM CHLORIDE 10 MEQ/100ML IV SOLN
INTRAVENOUS | Status: AC
  Administered 2018-07-09: 10 meq via INTRAVENOUS
  Filled 2018-07-09: qty 100

## 2018-07-09 MED ORDER — SODIUM CHLORIDE 0.9% FLUSH: 3.0000 mL | INTRAVENOUS | Status: DC | PRN

## 2018-07-09 MED ORDER — APIXABAN 5 MG PO TABS
5.0000 mg | ORAL_TABLET | Freq: Two times a day (BID) | ORAL | Status: DC
  Administered 2018-07-09 – 2018-07-12 (×6): 5 mg via ORAL
  Filled 2018-07-09 (×6): qty 1

## 2018-07-09 MED ORDER — METOPROLOL TARTRATE 25 MG PO TABS
25.0000 mg | ORAL_TABLET | Freq: Two times a day (BID) | ORAL | Status: DC
  Administered 2018-07-10 – 2018-07-12 (×5): 25 mg via ORAL
  Filled 2018-07-09 (×6): qty 1

## 2018-07-09 MED ORDER — ASPIRIN EC 81 MG PO TBEC
81.0000 mg | DELAYED_RELEASE_TABLET | Freq: Every day | ORAL | Status: DC
  Administered 2018-07-09 – 2018-07-10 (×2): 81 mg via ORAL
  Filled 2018-07-09 (×2): qty 1

## 2018-07-09 MED ORDER — SODIUM CHLORIDE 0.9 % IV SOLN: 250.0000 mL | INTRAVENOUS | Status: DC | PRN

## 2018-07-09 MED ORDER — SODIUM CHLORIDE 0.9% FLUSH: 3.0000 mL | Freq: Two times a day (BID) | INTRAVENOUS | Status: DC

## 2018-07-09 NOTE — ED Provider Notes (Signed)
[facility] EMERGENCY DEPARTMENT Provider Note   CSN: [id number] Arrival date & time: 07/09/18  1032    History   Chief Complaint Chief Complaint  Patient presents with  . Cough    COVID r/o  . Fever    HPI [name] is a 73 y.o. female.     HPI Patient states she was hospitalized in January in [place] for 2 weeks for pneumonia.  States she is had a dry nonproductive cough since.  States she normally takes furosemide but stopped recently.  States has had increased lower extremity swelling.  This morning at 8 AM she had sudden onset shortness of breath where she states it felt she could not get enough oxygen.  Noted to be hypertensive by EMS.  Patient denied any chest pain.  Patient is afebrile in the emergency department. Past Medical History:  Diagnosis Date  . Anxiety   . Arthritis   . Bone spur   . Coronary artery disease    stent - 1999  . Depression   . Fibrocystic breast changes   . GERD (gastroesophageal reflux disease)   . Glucosuria   . Hernia, inguinal, left 10/2002  . Hypertension   . Melanoma ([place]) 10/2008   right shoulder and arm  . Menorrhagia   . Myocardial infarction [facility]) 1999    Patient Active Problem List   Diagnosis Date Noted  . Pain in both feet 10/07/2016  . Daytime somnolence 08/30/2014  . Coronary atherosclerosis of native coronary artery 10/17/2013  . Mixed hyperlipidemia 10/17/2013  . Essential hypertension, benign 10/17/2013    Past Surgical History:  Procedure Laterality Date  . ANGIOPLASTY  2000  . APPENDECTOMY    . bone chip removed from left foot     . CHOLECYSTECTOMY    . COLONOSCOPY WITH PROPOFOL N/A 03/03/2017   Procedure: COLONOSCOPY WITH PROPOFOL;  Surgeon: Juanita Craver, MD;  Location: [facility];  Service: Endoscopy;  Laterality: N/A;  . CORONARY ANGIOPLASTY WITH STENT PLACEMENT  10/99  . ESOPHAGOGASTRODUODENOSCOPY (EGD) WITH PROPOFOL N/A 03/03/2017   Procedure: ESOPHAGOGASTRODUODENOSCOPY (EGD)  WITH PROPOFOL;  Surgeon: Juanita Craver, MD;  Location: [facility];  Service: Endoscopy;  Laterality: N/A;  . HERNIA REPAIR     umbilical   . HYSTEROSCOPY  2/98   D&C (polyps)  . LEFT HEART CATHETERIZATION WITH CORONARY ANGIOGRAM N/A 01/25/2014   Procedure: LEFT HEART CATHETERIZATION WITH CORONARY ANGIOGRAM;  Surgeon: Jettie Booze, MD;  Location: [facility] CATH LAB;  Service: Cardiovascular;  Laterality: N/A;  . NODE DISSECTION     neg  . TOTAL ABDOMINAL HYSTERECTOMY     LSO     Failed TVH     OB History    Gravida  2   Para  2   Term      Preterm      AB      Living  2     SAB      TAB      Ectopic      Multiple      Live Births               Home Medications    Prior to Admission medications   Medication Sig Start Date End Date Taking? Authorizing Provider  acetaminophen (TYLENOL 8 HOUR ARTHRITIS PAIN) 650 MG CR tablet Take 1,300 mg every 8 (eight) hours as needed by mouth for pain.    [provider]  amLODipine (NORVASC) 10 MG tablet Take 10 mg  at bedtime by mouth.     [provider]  atorvastatin (LIPITOR) 10 MG tablet Take 1 tablet (10 mg total) by mouth daily. 02/01/14   Jettie Booze, MD  Fish Oil-Cholecalciferol (OMEGA-3 + VITAMIN D3 PO) Take 1 tablet daily by mouth. 1280 mg-1000 I.U.    [provider]  hydrochlorothiazide (HYDRODIURIL) 25 MG tablet Take 25 mg by mouth daily.    [provider]  isosorbide mononitrate (IMDUR) 60 MG 24 hr tablet Take 1 tablet (60 mg total) by mouth daily. Please keep upcoming appointment for further refills 10/28/17   Jettie Booze, MD  lisinopril (PRINIVIL,ZESTRIL) 20 MG tablet Take 1 tablet (20 mg total) by mouth daily. 01/18/18   Jettie Booze, MD  metoprolol (LOPRESSOR) 50 MG tablet Take 1 tablet (50 mg total) by mouth 2 (two) times daily. Decrease in dose 11/13/15 02/26/18  Jettie Booze, MD  Multiple Vitamin (MULTIVITAMIN WITH MINERALS) TABS tablet Take  1 tablet at bedtime by mouth.    [provider]  nitroGLYCERIN (NITROSTAT) 0.4 MG SL tablet DISSOLVE 1 TABLET UNDER THE TONGUE EVERY 5 MINUTES AS NEEDED FOR CHEST PAIN 03/11/17   Jettie Booze, MD  potassium chloride SA (K-DUR,KLOR-CON) 20 MEQ tablet Take 1 tablet (20 mEq total) by mouth 2 (two) times daily. 02/01/14   Jettie Booze, MD  ranitidine (ZANTAC) 300 MG tablet Take 300 mg at bedtime by mouth.    [provider]  sertraline (ZOLOFT) 50 MG tablet Take 150 mg by mouth at bedtime.    [provider]  Soft Lens Products (REWETTING DROPS) SOLN Place 1 drop 3 (three) times daily as needed into both eyes (for dry/irritated contact lenses).    [provider]    Family History Family History  Problem Relation Age of Onset  . Diabetes Mother   . Stroke Mother   . Hypertension Mother   . Heart disease Father   . Hypertension Father   . Cancer Father        pancreatic cancer  . Heart attack Father     Social History Social History   Tobacco Use  . Smoking status: Never Smoker  . Smokeless tobacco: Never Used  Substance Use Topics  . Alcohol use: Yes    Alcohol/week: 1.0 standard drinks    Types: 1 Standard drinks or equivalent per week    Comment: occ glass of wine  . Drug use: No    Frequency: 5.0 times per week     Allergies   Azithromycin; Other; Penicillins; Tetanus toxoids; Gadolinium derivatives; Morphine and related; and Augmentin [amoxicillin-pot clavulanate]   Review of Systems Review of Systems  Constitutional: Negative for fever.  HENT: Negative for congestion, sore throat and trouble swallowing.   Eyes: Negative for visual disturbance.  Respiratory: Positive for cough, shortness of breath and wheezing.   Cardiovascular: Positive for leg swelling. Negative for chest pain and palpitations.  Gastrointestinal: Negative for abdominal pain, constipation, diarrhea, nausea and vomiting.  Genitourinary: Negative for  dysuria, flank pain and frequency.  Musculoskeletal: Negative for back pain, myalgias and neck pain.  Skin: Negative for rash and wound.  Neurological: Positive for dizziness and light-headedness. Negative for weakness, numbness and headaches.  All other systems reviewed and are negative.    Physical Exam Updated Vital Signs BP (!) 187/83   Pulse 81   Temp 97.6 F (36.4 C) (Rectal)   Resp (!) 28   Ht 5\' 3"  (1.6 m)   Wt  98.4 kg   SpO2 95%   BMI 38.44 kg/m   Physical Exam Vitals signs and nursing note reviewed.  Constitutional:      Appearance: Normal appearance. She is well-developed.  HENT:     Head: Normocephalic and atraumatic.     Mouth/Throat:     Mouth: Mucous membranes are moist.  Eyes:     Extraocular Movements: Extraocular movements intact.     Pupils: Pupils are equal, round, and reactive to light.  Neck:     Musculoskeletal: Normal range of motion and neck supple. No neck rigidity or muscular tenderness.  Cardiovascular:     Rate and Rhythm: Normal rate and regular rhythm.  Pulmonary:     Effort: Pulmonary effort is normal.     Breath sounds: Normal breath sounds.     Comments: Few scattered wheezes.  Patient has crackles in bilateral bases.  Mild increased work of breathing. Abdominal:     General: Bowel sounds are normal.     Palpations: Abdomen is soft.     Tenderness: There is no abdominal tenderness. There is no guarding or rebound.  Musculoskeletal: Normal range of motion.        General: No tenderness.     Right lower leg: Edema present.     Left lower leg: Edema present.     Comments: 2+ bilateral lower extremity edema.  No asymmetry or tenderness.  Distal pulses intact.  Skin:    General: Skin is warm and dry.     Capillary Refill: Capillary refill takes less than 2 seconds.     Findings: No erythema or rash.  Neurological:     General: No focal deficit present.     Mental Status: She is alert and oriented to person, place, and time.   Psychiatric:        Mood and Affect: Mood normal.        Behavior: Behavior normal.      ED Treatments / Results  Labs (all labs ordered are listed, but only abnormal results are displayed) Labs Reviewed  CBC WITH DIFFERENTIAL/PLATELET - Abnormal; Notable for the following components:      Result Value   RBC 3.61 (*)    Hemoglobin 11.1 (*)    HCT 34.1 (*)    All other components within normal limits  COMPREHENSIVE METABOLIC PANEL - Abnormal; Notable for the following components:   Potassium 3.1 (*)    Glucose, Bld 145 (*)    Total Protein 6.0 (*)    Albumin 3.3 (*)    All other components within normal limits  BRAIN NATRIURETIC PEPTIDE - Abnormal; Notable for the following components:   B Natriuretic Peptide 282.1 (*)    All other components within normal limits  D-DIMER, QUANTITATIVE (NOT AT [facility]) - Abnormal; Notable for the following components:   D-Dimer, Quant 1.35 (*)    All other components within normal limits  SEDIMENTATION RATE - Abnormal; Notable for the following components:   Sed Rate 37 (*)    All other components within normal limits  CULTURE, BLOOD (ROUTINE X 2)  CULTURE, BLOOD (ROUTINE X 2)  TROPONIN I  LACTIC ACID, PLASMA  PROCALCITONIN  C-REACTIVE PROTEIN  FERRITIN    EKG EKG Interpretation  Date/Time:  Friday July 09 2018 10:35:25 EDT Ventricular Rate:  89 PR Interval:    QRS Duration: 109 QT Interval:  382 QTC Calculation: 465 R Axis:   65 Text Interpretation:  Sinus rhythm Minimal ST depression, anterolateral leads Confirmed by Lita Mains,  Shanon Brow [phone]) on 07/09/2018 12:31:52 PM   Radiology Ct Angio Chest Pe W And/or Wo Contrast  Result Date: 07/09/2018 CLINICAL DATA:  New onset of fever to 101.1 with dyspnea starting this morning and cough. Patient recently traveled to overlying [place] and [place]. EXAM: CT ANGIOGRAPHY CHEST WITH CONTRAST TECHNIQUE: Multidetector CT imaging of the chest was performed using the standard protocol during  bolus administration of intravenous contrast. Multiplanar CT image reconstructions and MIPs were obtained to evaluate the vascular anatomy. CONTRAST:  12mL OMNIPAQUE IOHEXOL 350 MG/ML SOLN COMPARISON:  07/09/2018 CXR FINDINGS: Cardiovascular: Cardiomegaly without pericardial effusion or thickening. Scattered three-vessel coronary arteriosclerosis is noted. Satisfactory pulmonary arterial opacification to the proximal segmental level without acute pulmonary embolus. Aortic atherosclerosis without aneurysm. Conventional branch pattern of the great vessels without significant stenosis. Mediastinum/Nodes: No enlarged mediastinal, hilar, or axillary lymph nodes. Thyroid gland, trachea, and esophagus demonstrate no significant findings. Lungs/Pleura: Moderate right greater than left pleural effusions with adjacent compressive atelectasis. Multilobar ground-glass opacities compatible with stigmata of CHF. No pneumothorax. Upper Abdomen: No acute abnormality. Musculoskeletal: No chest wall abnormality. No acute or significant osseous findings. Review of the MIP images confirms the above findings. IMPRESSION: Cardiomegaly with moderate bilateral pleural effusions and faint diffuse ground-glass opacities in a pattern most consistent with CHF. Coronary arteriosclerosis. No acute pulmonary embolus. Aortic Atherosclerosis (ICD10-I70.0). Electronically Signed   By: Ashley Royalty M.D.   On: 07/09/2018 14:40   Dg Chest Port 1 View  Result Date: 07/09/2018 CLINICAL DATA:  Shortness of breath, deep cough for 2 days, had pneumonia and C difficile in January, history melanoma, coronary artery disease, GERD, hypertension EXAM: PORTABLE CHEST 1 VIEW COMPARISON:  Portable exam 1112 hours compared to 02/09/2015 FINDINGS: Enlargement of cardiac silhouette with pulmonary vascular congestion. BILATERAL interstitial infiltrates primarily in the mid to lower lungs consistent with pulmonary edema. Small bibasilar pleural effusions. No  pneumothorax. IMPRESSION: CHF with small bibasilar effusions. Electronically Signed   By: Lavonia Dana M.D.   On: 07/09/2018 11:41    Procedures Procedures (including critical care time)  Medications Ordered in ED Medications  potassium chloride 10 mEq in 100 mL IVPB (has no administration in time range)  furosemide (LASIX) injection 40 mg (40 mg Intravenous Given 07/09/18 1321)  iohexol (OMNIPAQUE) 350 MG/ML injection 75 mL (75 mLs Intravenous Contrast Given 07/09/18 1408)   CRITICAL CARE Performed by: Julianne Rice Total critical care time: 30 minutes Critical care time was exclusive of separately billable procedures and treating other patients. Critical care was necessary to treat or prevent imminent or life-threatening deterioration. Critical care was time spent personally by me on the following activities: development of treatment plan with patient and/or surrogate as well as nursing, discussions with consultants, evaluation of patient's response to treatment, examination of patient, obtaining history from patient or surrogate, ordering and performing treatments and interventions, ordering and review of laboratory studies, ordering and review of radiographic studies, pulse oximetry and re-evaluation of patient's condition.  Initial Impression / Assessment and Plan / ED Course  I have reviewed the triage vital signs and the nursing notes.  Pertinent labs & imaging results that were available during my care of the patient were reviewed by me and considered in my medical decision making (see chart for details).       JUAN OLTHOFF was evaluated in Emergency Department on 07/09/2018 for the symptoms described in the history of present illness. She was evaluated in the context of the global COVID-19 pandemic, which necessitated consideration that  the patient might be at risk for infection with the SARS-CoV-2 virus that causes COVID-19. Institutional protocols and algorithms that pertain to  the evaluation of patients at risk for COVID-19 are in a state of rapid change based on information released by regulatory bodies including the CDC and federal and state organizations. These policies and algorithms were followed during the patient's care in the ED.  Patient was initially on 10 L of oxygen.  She is weaned to 2 L.  Chest x-ray consistent with pulmonary edema.  She does have an elevation in her BNP.  Normal white blood cell count.  Patient does have an elevated d-dimer.  Given recent travel history and lower extremity swelling, will get CT angio chest to rule out PE. Patient with no known COVID contacts or travel to high risk areas.  CT with no evidence of PE.  Most consistent with CHF pattern.  Moderate bilateral pleural effusions.  Given IV Lasix in the emergency department.  Continues to require oxygen to maintain saturations in the 90s.  Discussed with hospitalist regarding admission. Final Clinical Impressions(s) / ED Diagnoses   Final diagnoses:  Acute on chronic congestive heart failure, unspecified heart failure type [facility])    ED Discharge Orders    None       Julianne Rice, MD 07/09/18 [phone]

## 2018-07-09 NOTE — ED Notes (Signed)
ED TO INPATIENT HANDOFF REPORT  ED Nurse Name and Phone #: [phone] Olene Craven Name/Age/Gender [name] 73 y.o. female Room/Bed: 024C/024C  Code Status   Code Status: Prior  Home/SNF/Other Home Patient oriented to: self, place, time and situation Is this baseline? Yes   Triage Complete: Triage complete  Chief Complaint r/o covid  Triage Note Pt arrives with [facility] from home c/o new onset of fever today 101.1, SHOB that started this morning, and a cough. Pt recently traveled to [place] in January and [place]; was dx/d with pneumonia and bronchitis and given antibioticsa. Pt reports her cough has been present since January. Per EMS, pt has BP of 230/130. Per pt, BP has been elevated like this for ~ 1 week.   Allergies Allergies  Allergen Reactions  . Azithromycin Swelling  . Other Itching, Swelling, Other (See Comments) and Cough    Horse products  . Penicillins Swelling    Has patient had a PCN reaction causing immediate rash, facial/tongue/throat swelling, SOB or lightheadedness with hypotension: Yes Has patient had a PCN reaction causing severe rash involving mucus membranes or skin necrosis: No Has patient had a PCN reaction that required hospitalization: No Has patient had a PCN reaction occurring within the last 10 years: No If all of the above answers are "NO", then may proceed with Cephalosporin use.   . Tetanus Toxoids Swelling  . Gadolinium Derivatives Hives and Itching    Pt stated that her left arm was itching and I noticed that she had two hives on her chest. No difficulty breathing, sneezing. One 25mg  of benadryl was ordered by Dr. Jimmye Norman. Pt remained at facility and was monitored before going home. -ldrake  . Morphine And Related Nausea And Vomiting  . Augmentin [Amoxicillin-Pot Clavulanate] Rash    Has patient had a PCN reaction causing immediate rash, facial/tongue/throat swelling, SOB or lightheadedness with hypotension: Yes Has patient had  a PCN reaction causing severe rash involving mucus membranes or skin necrosis: Yes Has patient had a PCN reaction that required hospitalization:No Has patient had a PCN reaction occurring within the last 10 years: No If all of the above answers are "NO", then may proceed with Cephalosporin use.     Level of Care/Admitting Diagnosis ED Disposition    ED Disposition Condition [facility] Area: [place] [[id number]]  Level of Care: Progressive [102]  Diagnosis: Acute Respiratory Disease due to Covid-19 Virus [[id number]]  Admitting Physician: Verdi, Byron  Attending Physician: Nita Sells [phone]  Estimated length of stay: 3 - 4 days  Certification:: I certify this patient will need inpatient services for at least 2 midnights  PT Class (Do Not Modify): Inpatient [101]  PT Acc Code (Do Not Modify): Private [1]       B Medical/Surgery History Past Medical History:  Diagnosis Date  . Anxiety   . Arthritis   . Bone spur   . Coronary artery disease    stent - 1999  . Depression   . Fibrocystic breast changes   . GERD (gastroesophageal reflux disease)   . Glucosuria   . Hernia, inguinal, left 10/2002  . Hypertension   . Melanoma ([place]) 10/2008   right shoulder and arm  . Menorrhagia   . Myocardial infarction ([place]) 1999   Past Surgical History:  Procedure Laterality Date  . ANGIOPLASTY  2000  . APPENDECTOMY    . bone chip removed from left foot     .  CHOLECYSTECTOMY    . COLONOSCOPY WITH PROPOFOL N/A 03/03/2017   Procedure: COLONOSCOPY WITH PROPOFOL;  Surgeon: Juanita Craver, MD;  Location: [facility];  Service: Endoscopy;  Laterality: N/A;  . CORONARY ANGIOPLASTY WITH STENT PLACEMENT  10/99  . ESOPHAGOGASTRODUODENOSCOPY (EGD) WITH PROPOFOL N/A 03/03/2017   Procedure: ESOPHAGOGASTRODUODENOSCOPY (EGD) WITH PROPOFOL;  Surgeon: Juanita Craver, MD;  Location: [facility];  Service: Endoscopy;  Laterality: N/A;  . HERNIA REPAIR      umbilical   . HYSTEROSCOPY  2/98   D&C (polyps)  . LEFT HEART CATHETERIZATION WITH CORONARY ANGIOGRAM N/A 01/25/2014   Procedure: LEFT HEART CATHETERIZATION WITH CORONARY ANGIOGRAM;  Surgeon: Jettie Booze, MD;  Location: [facility] CATH LAB;  Service: Cardiovascular;  Laterality: N/A;  . NODE DISSECTION     neg  . TOTAL ABDOMINAL HYSTERECTOMY     LSO     Failed TVH     A IV Location/Drains/Wounds Patient Lines/Drains/Airways Status   Active Line/Drains/Airways    Name:   Placement date:   Placement time:   Site:   Days:   Peripheral IV 07/09/18 Left Forearm   07/09/18    1047    Forearm   less than 1          Intake/Output Last 24 hours No intake or output data in the 24 hours ending 07/09/18 1534  Labs/Imaging Results for orders placed or performed during the hospital encounter of 07/09/18 (from the past 48 hour(s))  CBC with Differential/Platelet     Status: Abnormal   Collection Time: 07/09/18 11:03 AM  Result Value Ref Range   WBC 8.7 4.0 - 10.5 K/uL   RBC 3.61 (L) 3.87 - 5.11 MIL/uL   Hemoglobin 11.1 (L) 12.0 - 15.0 g/dL   HCT 34.1 (L) 36.0 - 46.0 %   MCV 94.5 80.0 - 100.0 fL   MCH 30.7 26.0 - 34.0 pg   MCHC 32.6 30.0 - 36.0 g/dL   RDW 13.5 11.5 - 15.5 %   Platelets 179 150 - 400 K/uL   nRBC 0.0 0.0 - 0.2 %   Neutrophils Relative % 75 %   Neutro Abs 6.6 1.7 - 7.7 K/uL   Lymphocytes Relative 16 %   Lymphs Abs 1.4 0.7 - 4.0 K/uL   Monocytes Relative 6 %   Monocytes Absolute 0.5 0.1 - 1.0 K/uL   Eosinophils Relative 1 %   Eosinophils Absolute 0.1 0.0 - 0.5 K/uL   Basophils Relative 1 %   Basophils Absolute 0.1 0.0 - 0.1 K/uL   Immature Granulocytes 1 %   Abs Immature Granulocytes 0.04 0.00 - 0.07 K/uL    Comment: Performed at [facility], [address], [place], [place] [address]  Comprehensive metabolic panel     Status: Abnormal   Collection Time: 07/09/18 11:03 AM  Result Value Ref Range   Sodium 142 135 - 145 mmol/L   Potassium 3.1 (L) 3.5 -  5.1 mmol/L   Chloride 109 98 - 111 mmol/L   CO2 23 22 - 32 mmol/L   Glucose, Bld 145 (H) 70 - 99 mg/dL   BUN 9 8 - 23 mg/dL   Creatinine, Ser 0.94 0.44 - 1.00 mg/dL   Calcium 9.1 8.9 - 10.3 mg/dL   Total Protein 6.0 (L) 6.5 - 8.1 g/dL   Albumin 3.3 (L) 3.5 - 5.0 g/dL   AST 24 15 - 41 U/L   ALT 31 0 - 44 U/L   Alkaline Phosphatase 74 38 - 126 U/L  Total Bilirubin 1.2 0.3 - 1.2 mg/dL   GFR calc non Af Amer >60 >60 mL/min   GFR calc Af Amer >60 >60 mL/min   Anion gap 10 5 - 15    Comment: Performed at [facility] [address], [place], [place] [address]  Brain natriuretic peptide     Status: Abnormal   Collection Time: 07/09/18 11:03 AM  Result Value Ref Range   B Natriuretic Peptide 282.1 (H) 0.0 - 100.0 pg/mL    Comment: Performed at [facility] [address], [place], [place] [address]  Troponin I - ONCE - STAT     Status: None   Collection Time: 07/09/18 11:03 AM  Result Value Ref Range   Troponin I <0.03 <0.03 ng/mL    Comment: Performed at [facility] [address] [place], [place] [address]  D-dimer, quantitative (not at [facility])     Status: Abnormal   Collection Time: 07/09/18 11:03 AM  Result Value Ref Range   D-Dimer, Quant 1.35 (H) 0.00 - 0.50 ug/mL-FEU    Comment: (NOTE) At the manufacturer cut-off of 0.50 ug/mL FEU, this assay has been documented to exclude PE with a sensitivity and negative predictive value of 97 to 99%.  At this time, this assay has not been approved by the FDA to exclude DVT/VTE. Results should be correlated with clinical presentation. Performed at [facility], [place] [address] [place], [place] [address]   Procalcitonin     Status: None   Collection Time: 07/09/18 11:03 AM  Result Value Ref Range   Procalcitonin <0.10 ng/mL    Comment:        Interpretation: PCT (Procalcitonin) <= 0.5 ng/mL: Systemic infection (sepsis) is not likely. Local bacterial infection is possible. (NOTE)       Sepsis PCT Algorithm            Lower Respiratory Tract                                      Infection PCT Algorithm    ----------------------------     ----------------------------         PCT < 0.25 ng/mL                PCT < 0.10 ng/mL         Strongly encourage             Strongly discourage   discontinuation of antibiotics    initiation of antibiotics    ----------------------------     -----------------------------       PCT 0.25 - 0.50 ng/mL            PCT 0.10 - 0.25 ng/mL               OR       >80% decrease in PCT            Discourage initiation of                                            antibiotics      Encourage discontinuation           of antibiotics    ----------------------------     -----------------------------  PCT >= 0.50 ng/mL              PCT 0.26 - 0.50 ng/mL               AND        <80% decrease in PCT             Encourage initiation of                                             antibiotics       Encourage continuation           of antibiotics    ----------------------------     -----------------------------        PCT >= 0.50 ng/mL                  PCT > 0.50 ng/mL               AND         increase in PCT                  Strongly encourage                                      initiation of antibiotics    Strongly encourage escalation           of antibiotics                                     -----------------------------                                           PCT <= 0.25 ng/mL                                                 OR                                        > 80% decrease in PCT                                     Discontinue / Do not initiate                                             antibiotics Performed at [facility], [address], [place], [place] [address]   Sedimentation rate     Status: Abnormal   Collection Time: 07/09/18 11:03 AM  Result Value Ref Range   Sed Rate 37 (H) 0 - 22 mm/hr    Comment: Performed at [facility] [address] [place], [place] [address]  C-reactive protein  Status: None   Collection Time: 07/09/18 11:03 AM  Result Value Ref Range   CRP <0.8 <1.0 mg/dL    Comment: Performed at [facility], [place] [address], [place], [place] [address]  Ferritin (Iron Binding Protein)     Status: None   Collection Time: 07/09/18 11:03 AM  Result Value Ref Range   Ferritin 80 11 - 307 ng/mL    Comment: Performed at [facility], [place] [address] [place], [place] [address]  Lactic acid, plasma     Status: None   Collection Time: 07/09/18 11:04 AM  Result Value Ref Range   Lactic Acid, Venous 0.9 0.5 - 1.9 mmol/L    Comment: Performed at [facility] [address] [place], [place] [address]   Ct Angio Chest Pe W And/or Wo Contrast  Result Date: 07/09/2018 CLINICAL DATA:  New onset of fever to 101.1 with dyspnea starting this morning and cough. Patient recently traveled to overlying [place] and [place]. EXAM: CT ANGIOGRAPHY CHEST WITH CONTRAST TECHNIQUE: Multidetector CT imaging of the chest was performed using the standard protocol during bolus administration of intravenous contrast. Multiplanar CT image reconstructions and MIPs were obtained to evaluate the vascular anatomy. CONTRAST:  10mL OMNIPAQUE IOHEXOL 350 MG/ML SOLN COMPARISON:  07/09/2018 CXR FINDINGS: Cardiovascular: Cardiomegaly without pericardial effusion or thickening. Scattered three-vessel coronary arteriosclerosis is noted. Satisfactory pulmonary arterial opacification to the proximal segmental level without acute pulmonary embolus. Aortic atherosclerosis without aneurysm. Conventional branch pattern of the great vessels without significant stenosis. Mediastinum/Nodes: No enlarged mediastinal, hilar, or axillary lymph nodes. Thyroid gland, trachea, and esophagus demonstrate no significant findings. Lungs/Pleura: Moderate right greater than left pleural effusions with adjacent compressive atelectasis. Multilobar  ground-glass opacities compatible with stigmata of CHF. No pneumothorax. Upper Abdomen: No acute abnormality. Musculoskeletal: No chest wall abnormality. No acute or significant osseous findings. Review of the MIP images confirms the above findings. IMPRESSION: Cardiomegaly with moderate bilateral pleural effusions and faint diffuse ground-glass opacities in a pattern most consistent with CHF. Coronary arteriosclerosis. No acute pulmonary embolus. Aortic Atherosclerosis (ICD10-I70.0). Electronically Signed   By: Ashley Royalty M.D.   On: 07/09/2018 14:40   Dg Chest Port 1 View  Result Date: 07/09/2018 CLINICAL DATA:  Shortness of breath, deep cough for 2 days, had pneumonia and C difficile in January, history melanoma, coronary artery disease, GERD, hypertension EXAM: PORTABLE CHEST 1 VIEW COMPARISON:  Portable exam 1112 hours compared to 02/09/2015 FINDINGS: Enlargement of cardiac silhouette with pulmonary vascular congestion. BILATERAL interstitial infiltrates primarily in the mid to lower lungs consistent with pulmonary edema. Small bibasilar pleural effusions. No pneumothorax. IMPRESSION: CHF with small bibasilar effusions. Electronically Signed   By: Lavonia Dana M.D.   On: 07/09/2018 11:41    Pending Labs Unresulted Labs (From admission, onward)    Start     Ordered   07/09/18 1103  Culture, blood (Routine X 2) w Reflex to ID Panel  BLOOD CULTURE X 2,   STAT     07/09/18 1104          Vitals/Pain Today's Vitals   07/09/18 1145 07/09/18 1230 07/09/18 1300 07/09/18 1330  BP: (!) 180/84 (!) 151/59 (!) 178/92 (!) 187/83  Pulse: 76 82 81 81  Resp: (!) 28 20 (!) 27 (!) 28  Temp:      TempSrc:      SpO2: 100% 94% 96% 95%  Weight:      Height:      PainSc:  Isolation Precautions No active isolations  Medications Medications  potassium chloride 10 mEq in 100 mL IVPB (has no administration in time range)  furosemide (LASIX) injection 40 mg (40 mg Intravenous Given 07/09/18 1321)   iohexol (OMNIPAQUE) 350 MG/ML injection 75 mL (75 mLs Intravenous Contrast Given 07/09/18 1408)    Mobility walks Moderate fall risk   Focused Assessments    R Recommendations: See Admitting Provider Note  Report given to:   Additional Notes: pt had recent travel to [place]

## 2018-07-09 NOTE — H&P (Signed)
HPI  [name] OZD:[id number] DOB: 1945/06/08 DOA: 07/09/2018  PCP: Leighton Ruff, MD   Chief Complaint: Shortness of breath  HPI:  73 year old Caucasian female known history of recent hospitalization January in [place]-treated there apparently per her report at [facility] in [place] for atrial fibrillation and pneumonia- She was discharged on Eliquis and fluid medicine  Past medical history as listed mainly from the patient itself Has been feeling poorly for the past 5 to 6 days-woke up 7:54 AM this morning lightheaded and short of breath Had one episode of emesis--- no community illnesses  No fever, chills, belly pain although was noted on EMT truck 101 degrees + Sputum Legs have been swollen for the past 6 weeks since that she stopped taking her fluid pill on her own States that it made her "pee a lot" Her history of travel noted in the recent past as well was to [place] long before Covid Status She also felt that potentially she had a UTI and was given ciprofloxacin  Tells me that at baseline she can walk around without any shortness of breath since she has had this issue up to about 5 to 6 weeks ago she can hardly walk 50 feet to her mailbox does have orthopnea and feels better sitting up  ED Course:    Review of Systems:   Negative for fever, visual changes, sore throat, rash, new muscle aches, chest pain, SOB, dysuria, bleeding, n/v/abdominal pain.  Past Medical History:  Diagnosis Date  . Anxiety   . Arthritis   . Bone spur   . Coronary artery disease    stent - 1999  . Depression   . Fibrocystic breast changes   . GERD (gastroesophageal reflux disease)   . Glucosuria   . Hernia, inguinal, left 10/2002  . Hypertension   . Melanoma ([place]) 10/2008   right shoulder and arm  . Menorrhagia   . Myocardial infarction [facility]) 1999   She was diagnosed with A. fib recently and is on Eliquis  Past Surgical History:  Procedure Laterality Date   . ANGIOPLASTY  2000  . APPENDECTOMY    . bone chip removed from left foot     . CHOLECYSTECTOMY    . COLONOSCOPY WITH PROPOFOL N/A 03/03/2017   Procedure: COLONOSCOPY WITH PROPOFOL;  Surgeon: Juanita Craver, MD;  Location: [facility] ENDOSCOPY;  Service: Endoscopy;  Laterality: N/A;  . CORONARY ANGIOPLASTY WITH STENT PLACEMENT  10/99  . ESOPHAGOGASTRODUODENOSCOPY (EGD) WITH PROPOFOL N/A 03/03/2017   Procedure: ESOPHAGOGASTRODUODENOSCOPY (EGD) WITH PROPOFOL;  Surgeon: Juanita Craver, MD;  Location: [facility] ENDOSCOPY;  Service: Endoscopy;  Laterality: N/A;  . HERNIA REPAIR     umbilical   . HYSTEROSCOPY  2/98   D&C (polyps)  . LEFT HEART CATHETERIZATION WITH CORONARY ANGIOGRAM N/A 01/25/2014   Procedure: LEFT HEART CATHETERIZATION WITH CORONARY ANGIOGRAM;  Surgeon: Jettie Booze, MD;  Location: [facility] CATH LAB;  Service: Cardiovascular;  Laterality: N/A;  . NODE DISSECTION     neg  . TOTAL ABDOMINAL HYSTERECTOMY     LSO     Failed TVH     reports that she has never smoked. She has never used smokeless tobacco. She reports current alcohol use of about 1.0 standard drinks of alcohol per week. She reports that she does not use drugs. Mobility: independant  Allergies  Allergen Reactions  . Azithromycin Swelling  . Other Itching, Swelling, Other (See Comments) and Cough    Horse products  . Penicillins Swelling  Has patient had a PCN reaction causing immediate rash, facial/tongue/throat swelling, SOB or lightheadedness with hypotension: Yes Has patient had a PCN reaction causing severe rash involving mucus membranes or skin necrosis: No Has patient had a PCN reaction that required hospitalization: No Has patient had a PCN reaction occurring within the last 10 years: No If all of the above answers are "NO", then may proceed with Cephalosporin use.   . Tetanus Toxoids Swelling  . Gadolinium Derivatives Hives and Itching    Pt stated that her left arm was itching and I noticed that she had two hives  on her chest. No difficulty breathing, sneezing. One 80m of benadryl was ordered by Dr. WJimmye Norman Pt remained at facility and was monitored before going home. -ldrake  . Morphine And Related Nausea And Vomiting  . Augmentin [Amoxicillin-Pot Clavulanate] Rash    Has patient had a PCN reaction causing immediate rash, facial/tongue/throat swelling, SOB or lightheadedness with hypotension: Yes Has patient had a PCN reaction causing severe rash involving mucus membranes or skin necrosis: Yes Has patient had a PCN reaction that required hospitalization:No Has patient had a PCN reaction occurring within the last 10 years: No If all of the above answers are "NO", then may proceed with Cephalosporin use.     Family History  Problem Relation Age of Onset  . Diabetes Mother   . Stroke Mother   . Hypertension Mother   . Heart disease Father   . Hypertension Father   . Cancer Father        pancreatic cancer  . Heart attack Father      Prior to Admission medications   Medication Sig Start Date End Date Taking? Authorizing Provider  acetaminophen (TYLENOL 8 HOUR ARTHRITIS PAIN) 650 MG CR tablet Take 1,300 mg every 8 (eight) hours as needed by mouth for pain.    [provider]  amLODipine (NORVASC) 10 MG tablet Take 10 mg at bedtime by mouth.     [provider]  atorvastatin (LIPITOR) 10 MG tablet Take 1 tablet (10 mg total) by mouth daily. 02/01/14   VJettie Booze MD  Fish Oil-Cholecalciferol (OMEGA-3 + VITAMIN D3 PO) Take 1 tablet daily by mouth. 1280 mg-1000 I.U.    [provider]  hydrochlorothiazide (HYDRODIURIL) 25 MG tablet Take 25 mg by mouth daily.    [provider]  isosorbide mononitrate (IMDUR) 60 MG 24 hr tablet Take 1 tablet (60 mg total) by mouth daily. Please keep upcoming appointment for further refills 10/28/17   VJettie Booze MD  lisinopril (PRINIVIL,ZESTRIL) 20 MG tablet Take 1 tablet (20 mg total) by mouth daily. 01/18/18    VJettie Booze MD  metoprolol (LOPRESSOR) 50 MG tablet Take 1 tablet (50 mg total) by mouth 2 (two) times daily. Decrease in dose 11/13/15 02/26/18  VJettie Booze MD  Multiple Vitamin (MULTIVITAMIN WITH MINERALS) TABS tablet Take 1 tablet at bedtime by mouth.    [provider]  nitroGLYCERIN (NITROSTAT) 0.4 MG SL tablet DISSOLVE 1 TABLET UNDER THE TONGUE EVERY 5 MINUTES AS NEEDED FOR CHEST PAIN 03/11/17   VJettie Booze MD  potassium chloride SA (K-DUR,KLOR-CON) 20 MEQ tablet Take 1 tablet (20 mEq total) by mouth 2 (two) times daily. 02/01/14   VJettie Booze MD  ranitidine (ZANTAC) 300 MG tablet Take 300 mg at bedtime by mouth.    [provider]  sertraline (ZOLOFT) 50 MG tablet Take 150 mg by mouth at bedtime.    [provider]  Soft Lens Products (REWETTING DROPS) SOLN Place 1 drop 3 (three) times daily as needed into both eyes (for dry/irritated contact lenses).    [provider]    Physical Exam:  Vitals:   07/09/18 1300 07/09/18 1330  BP: (!) 178/92 (!) 187/83  Pulse: 81 81  Resp: (!) 27 (!) 28  Temp:    SpO2: 96% 95%     Awake alert pleasant no distress EOMI NCAT thick neck no JVD as exam is difficult secondary to obesity  Mallampati 2  GCS 15  Mild crackles posterolaterally lower lung fields anteriorly she she has no rales or rhonchi and good air entry  S1-S2 appears to be in sinus rhythm at this time  Neurologically intact moving all 4 limbs equally finger-nose-finger is intact power is grossly intact she is coherent smile is symmetric sensory is grossly intact  Musculoskeletal no swellings  Skin she does have 1+ pitting edema lower extremities   I have personally reviewed following labs and imaging studies  Labs:   Potassium 3.1  bnp 282, ferriotin 80, crp <crp, Lactic acid 0.9, PCT <0.10  WBC 8.7, Hemoglobin 11.1, 34.1, PLT 179  ESR 37  Dimer 1.3  Imaging studies:  Ct chest:  Cardiomegaly with moderate bilateral pleural effusions and faint diffuse ground-glass opacities in a pattern most consistent with CHF.  Coronary arteriosclerosis.   No acute pulmonary embolus.   Medical tests:   EKG independently reviewed: PR 0.04, QRS axis is 60 degrees, NSR      Principal Problem:   Acute diastolic CHF (congestive heart failure) (HCC) Essential hypertension, benign   Coronary artery disease Recent onset atrial fibrillation RVR chads score >2 Patient is noncompliant with Imdur 60 lisinopril 20 from her hospitalization she is also in addition noncompliant with her Lasix per history-consider weaning clonidine not a good medication if meds are missed I suspect she probably has heart failure based on h/o- We will discontinue amlodipine 5 daily because of lower extremity swelling. Lasix started at 40 mg iv bid for now and we will keep strict I's and O's Cut back dose of metoprolol from 50-25 twice daily given acute exacerbation discontinue labetalol I will check a magnesium and add potassium 40 twice daily Atrial fibrillation will be placed on lower dose of metoprolol and may need to increase if needed or changed to 3 times daily   Pain in both feet  Acute Respiratory Disease due to Covid-19 Virus suspected Given recent travel history although it is unlikely we will place on droplet precautions and monitor I will order RVP and COVID-19   Depression Not on any specific medications for this at this time Monitor as an outpatient Mild COPD/asthma continue  Flonase as well as albuterol every 4 as needed   Suspected Covid-19 Virus Infection     Severity of Illness: The appropriate patient status for this patient is INPATIENT. Inpatient status is judged to be reasonable and necessary in order to provide the required intensity of service to ensure the patient's safety. The patient's presenting symptoms, physical exam findings, and initial radiographic and laboratory  data in the context of their chronic comorbidities is felt to place them at high risk for further clinical deterioration. Furthermore, it is not anticipated that the patient will be medically stable for discharge from the hospital within 2 midnights of admission. The following factors support the patient status of inpatient.   " The patient's presenting symptoms include SOB and N/V. " The worrisome physical  exam findings include Fluid ovelroad. " The initial radiographic and laboratory data are worrisome because of n. " The chronic co-morbidities include n.   * I certify that at the point of admission it is my clinical judgment that the patient will require inpatient hospital care spanning beyond 2 midnights from the point of admission due to high intensity of service, high risk for further deterioration and high frequency of surveillance required.*     DVT prophylaxis:lovenox Code Status: Full Family Communication: none at bedside Consults called: none yet    Time spent: 45 minutes  Samtani, MD  [facility] Direct contact: [phone] --[place]ia [place]  --[email or web address]; password [email or web address]  7PM-7AM contact night coverage as above  07/09/2018, 3:25 PM

## 2018-07-09 NOTE — ED Triage Notes (Signed)
Pt arrives with [facility] from home c/o new onset of fever today 101.1, SHOB that started this morning, and a cough. Pt recently traveled to [place] in January and [place]; was dx/d with pneumonia and bronchitis and given antibioticsa. Pt reports her cough has been present since January. Per EMS, pt has BP of 230/130. Per pt, BP has been elevated like this for ~ 1 week.

## 2018-07-09 NOTE — ED Notes (Signed)
Attempted to call report to 2W. Told that charge nurse is meeting with CON and will be called back.

## 2018-07-09 NOTE — ED Notes (Signed)
Rectal temp 97.6

## 2018-07-10 DIAGNOSIS — R6889 Other general symptoms and signs: Secondary | ICD-10-CM

## 2018-07-10 DIAGNOSIS — F329 Major depressive disorder, single episode, unspecified: Secondary | ICD-10-CM

## 2018-07-10 DIAGNOSIS — I10 Essential (primary) hypertension: Secondary | ICD-10-CM

## 2018-07-10 DIAGNOSIS — I2583 Coronary atherosclerosis due to lipid rich plaque: Secondary | ICD-10-CM

## 2018-07-10 DIAGNOSIS — I2510 Atherosclerotic heart disease of native coronary artery without angina pectoris: Secondary | ICD-10-CM

## 2018-07-10 LAB — BASIC METABOLIC PANEL
Anion gap: 12 (ref 5–15)
BUN: 6 mg/dL — ABNORMAL LOW (ref 8–23)
CO2: 27 mmol/L (ref 22–32)
Calcium: 8.8 mg/dL — ABNORMAL LOW (ref 8.9–10.3)
Chloride: 103 mmol/L (ref 98–111)
Creatinine, Ser: 1.05 mg/dL — ABNORMAL HIGH (ref 0.44–1.00)
GFR calc Af Amer: >60 mL/min (ref >60)
GFR calc non Af Amer: 53 mL/min — ABNORMAL LOW (ref >60)
Glucose, Bld: 94 mg/dL (ref 70–99)
Potassium: 2.8 mmol/L — ABNORMAL LOW (ref 3.5–5.1)
Sodium: 142 mmol/L (ref 135–145)

## 2018-07-10 LAB — MAGNESIUM: Magnesium: 1.6 mg/dL — ABNORMAL LOW (ref 1.7–2.4)

## 2018-07-10 MED ORDER — FUROSEMIDE 10 MG/ML IJ SOLN
40.0000 mg | Freq: Two times a day (BID) | INTRAMUSCULAR | Status: DC
  Administered 2018-07-10 – 2018-07-11 (×3): 40 mg via INTRAVENOUS
  Filled 2018-07-10 (×2): qty 4

## 2018-07-10 MED ORDER — FUROSEMIDE 10 MG/ML IJ SOLN: 40.0000 mg | Freq: Two times a day (BID) | INTRAMUSCULAR | Status: DC

## 2018-07-10 MED ORDER — PROMETHAZINE HCL 25 MG RE SUPP
25.0000 mg | Freq: Four times a day (QID) | RECTAL | Status: DC | PRN
  Filled 2018-07-10: qty 1

## 2018-07-10 MED ORDER — ONDANSETRON HCL 4 MG/2ML IJ SOLN
4.0000 mg | Freq: Four times a day (QID) | INTRAMUSCULAR | Status: DC | PRN
  Administered 2018-07-10: 4 mg via INTRAVENOUS
  Filled 2018-07-10: qty 2

## 2018-07-10 MED ORDER — POTASSIUM CHLORIDE CRYS ER 20 MEQ PO TBCR
40.0000 meq | EXTENDED_RELEASE_TABLET | Freq: Three times a day (TID) | ORAL | Status: DC
  Administered 2018-07-10 – 2018-07-12 (×7): 40 meq via ORAL
  Filled 2018-07-10 (×6): qty 2

## 2018-07-10 MED ORDER — MAGNESIUM SULFATE 2 GM/50ML IV SOLN
2.0000 g | Freq: Once | INTRAVENOUS | Status: AC
  Administered 2018-07-10: 2 g via INTRAVENOUS
  Filled 2018-07-10: qty 50

## 2018-07-10 MED ORDER — PROMETHAZINE HCL 25 MG PO TABS: 25.0000 mg | ORAL_TABLET | Freq: Four times a day (QID) | ORAL | Status: DC | PRN

## 2018-07-10 MED ORDER — PROMETHAZINE HCL 25 MG/ML IJ SOLN: 12.5000 mg | Freq: Four times a day (QID) | INTRAMUSCULAR | Status: DC | PRN

## 2018-07-10 NOTE — Progress Notes (Signed)
PROGRESS NOTE    SHARLIE SHREFFLER  RCV:[id number] DOB: 07/16/1945 DOA: 07/09/2018 PCP: Leighton Ruff, MD      Brief Narrative:  Mrs. Coin is a 73 y.o. F with Afib on Eliquis, HTN, CAD s/p PCI >10 yrs follows with Dr. Irish Lack who presents with 1 week progressive SOB, now orthopnea and severe dyspnea.  Patient was in [place] until 10 days before admission, stopped in [place] to close up winter home then flew to [place] around 4 days PTA.  Around the time she returned to [place], she developed fatigue, malaise, DOE, nasal congestion.  This progressed until the morning of admission, she had sudden onset SOB.  This improved with sitting up, but did not abate, so she came to ER.  In ER, afebrile, RR >40.  CXR showed edema and CTA chest showed bilateral effusions, and central GGO c/w edema.  CoV testing sent due to recent travel and reported fever with EMS.       Assessment & Plan:  Acute diastolic CHF EF unknown.  Rceently diagnosed with CHF in [place], was noncompliant with diuretic.  Now with leg swellng, orthopnea, elevated BNP.    Symptoms improving this morning, still on supplemental O2. -Furosemide 40 mg IV twice a day  -K supplement -Strict I/Os, daily weights, telemetry  -Daily monitoring renal function -Echo after COVID ruled out    Rule out coronavirus/potential COVID Clinically, she has hypoxia, malaise, reported fever, bilateral infiltrates, and hypoxia, although these could also be from CHF -Trend O2 demands -Negative pressure room, droplet contact precautions  Atrial fibrillation, parox This was newly diagnosed in [place], she reports.  CHA2DS2-Vasc 5. -Continue metoprolol and apixaban -Stop aspirin  Hypertension CV disease secondary prevention -Continue clonidine, metoprolol, furosemide -Continue atorvastatin -Hold aspirin -Hold home amlodipine, labetalol  OSA -Hold CPAP while on coronavirus precautions  Hypokalemia -Aggressive K supplement -Supplement  Mag  Mild normocytic anemia Stable        MDM and disposition: The below labs and imaging reports were reviewed and summarized above.  Medication management as above including anticoagulant.  The patient was admitted with a CHF exacerbation.  She has persistent hypoxia, not on home O2.  She is improving, but will need continued IV diuresis for this severe exacerbation of her chronic diastolic CHF.         DVT prophylaxis: Lovenox Code Status: FULL Family Communication: None present    Consultants:   None  Procedures:   None  Antimicrobials:   None    Subjective: Leg swelling better.  Wheezing improved, but still orthopnea.  No fever.  No cough, body aches, confusion.  No palpitations.  No chest pain.    Objective: Vitals:   07/09/18 1718 07/09/18 2100 07/10/18 0300 07/10/18 0800  BP: (!) 167/73 (!) 155/65  (!) 160/72  Pulse: 75 70  76  Resp:  18    Temp: 97.9 F (36.6 C) 99.2 F (37.3 C)  99 F (37.2 C)  TempSrc: Oral Oral  Oral  SpO2: 98% 96%  96%  Weight:   97.5 kg   Height:        Intake/Output Summary (Last 24 hours) at 07/10/2018 1256 Last data filed at 07/10/2018 0900 [facility] per 24 hour  Intake 935 ml  Output 2200 ml  Net -1265 ml   Filed Weights   07/09/18 1042 07/10/18 0300  Weight: 98.4 kg 97.5 kg    Examination: General appearance: Overweight adult female, alert and in no acute distress.   HEENT: Anicteric, conjunctiva  pink, lids and lashes normal. No nasal deformity, discharge, epistaxis.  Lips moist, dentition normal, oropharynx moist, no oral lesions, hearing normal.   Skin: Warm and dry.  no jaundice.  No suspicious rashes or lesions. Cardiac: RRR, nl S1-S2, no murmurs appreciated.  Capillary refill is brisk.  JVP not visible due to body habitus, but no external JVD.  1+ LE edema.  Radia  pulses 2+ and symmetric. Respiratory: Normal respiratory rate and rhythm.  CTAB without rales or wheezes. Abdomen: Abdomen soft.  No TTP. No  ascites, distension, hepatosplenomegaly.   MSK: No deformities or effusions. Neuro: Awake and alert.  EOMI, moves all extremities. Speech fluent.    Psych: Sensorium intact and responding to questions, attention normal. Affect normal.  Judgment and insight appear normal.    Data Reviewed: I have personally reviewed following labs and imaging studies:  CBC: Recent Labs  Lab 07/09/18 1103  WBC 8.7  NEUTROABS 6.6  HGB 11.1*  HCT 34.1*  MCV 94.5  PLT 119   Basic Metabolic Panel: Recent Labs  Lab 07/09/18 1103 07/10/18 0238  NA 142 142  K 3.1* 2.8*  CL 109 103  CO2 23 27  GLUCOSE 145* 94  BUN 9 6*  CREATININE 0.94 1.05*  CALCIUM 9.1 8.8*  MG  --  1.6*   GFR: Estimated Creatinine Clearance: 53.8 mL/min (A) (by C-G formula based on SCr of 1.05 mg/dL (H)). Liver Function Tests: Recent Labs  Lab 07/09/18 1103  AST 24  ALT 31  ALKPHOS 74  BILITOT 1.2  PROT 6.0*  ALBUMIN 3.3*   No results for input(s): LIPASE, AMYLASE in the last 168 hours. No results for input(s): AMMONIA in the last 168 hours. Coagulation Profile: No results for input(s): INR, PROTIME in the last 168 hours. Cardiac Enzymes: Recent Labs  Lab 07/09/18 1103  TROPONINI <0.03   BNP (last 3 results) No results for input(s): PROBNP in the last 8760 hours. HbA1C: No results for input(s): HGBA1C in the last 72 hours. CBG: No results for input(s): GLUCAP in the last 168 hours. Lipid Profile: No results for input(s): CHOL, HDL, LDLCALC, TRIG, CHOLHDL, LDLDIRECT in the last 72 hours. Thyroid Function Tests: No results for input(s): TSH, T4TOTAL, FREET4, T3FREE, THYROIDAB in the last 72 hours. Anemia Panel: Recent Labs    07/09/18 1103  FERRITIN 80   Urine analysis:    Component Value Date/Time   BILIRUBINUR neg 09/19/2013 1421   PROTEINUR neg 09/19/2013 1421   UROBILINOGEN negative 09/19/2013 1421   NITRITE neg 09/19/2013 1421   LEUKOCYTESUR Negative 09/19/2013 1421   Sepsis  Labs: @LABRCNTIP (procalcitonin:4,lacticacidven:4)  ) Recent Results (from the past 240 hour(s))  Culture, blood (Routine X 2) w Reflex to ID Panel     Status: None (Preliminary result)   Collection Time: 07/09/18 10:40 AM  Result Value Ref Range Status   Specimen Description BLOOD RIGHT ANTECUBITAL  Final   Special Requests   Final    BOTTLES DRAWN AEROBIC ONLY Blood Culture adequate volume   Culture   Final    NO GROWTH < 24 HOURS Performed at [facility], [place] [address] [place], [place] [address]    Report Status PENDING  Incomplete  Culture, blood (Routine X 2) w Reflex to ID Panel     Status: None (Preliminary result)   Collection Time: 07/09/18 10:40 AM  Result Value Ref Range Status   Specimen Description BLOOD LEFT FOREARM  Final   Special Requests   Final  BOTTLES DRAWN AEROBIC AND ANAEROBIC Blood Culture results may not be optimal due to an excessive volume of blood received in culture bottles   Culture   Final    NO GROWTH < 24 HOURS Performed at [facility] [address], [place], [place] [address]    Report Status PENDING  Incomplete  Respiratory Panel by PCR     Status: None   Collection Time: 07/09/18  4:35 PM  Result Value Ref Range Status   Adenovirus NOT DETECTED NOT DETECTED Final   Coronavirus 229E NOT DETECTED NOT DETECTED Final    Comment: (NOTE) The Coronavirus on the Respiratory Panel, DOES NOT test for the novel  Coronavirus (2019 nCoV)    Coronavirus HKU1 NOT DETECTED NOT DETECTED Final   Coronavirus NL63 NOT DETECTED NOT DETECTED Final   Coronavirus OC43 NOT DETECTED NOT DETECTED Final   Metapneumovirus NOT DETECTED NOT DETECTED Final   Rhinovirus / Enterovirus NOT DETECTED NOT DETECTED Final   Influenza A NOT DETECTED NOT DETECTED Final   Influenza B NOT DETECTED NOT DETECTED Final   Parainfluenza Virus 1 NOT DETECTED NOT DETECTED Final   Parainfluenza Virus 2 NOT DETECTED NOT DETECTED Final   Parainfluenza Virus 3 NOT  DETECTED NOT DETECTED Final   Parainfluenza Virus 4 NOT DETECTED NOT DETECTED Final   Respiratory Syncytial Virus NOT DETECTED NOT DETECTED Final   Bordetella pertussis NOT DETECTED NOT DETECTED Final   Chlamydophila pneumoniae NOT DETECTED NOT DETECTED Final   Mycoplasma pneumoniae NOT DETECTED NOT DETECTED Final    Comment: Performed at [facility], [address] [place], [place] [address]         Radiology Studies: Ct Angio Chest Pe W And/or Wo Contrast  Result Date: 07/09/2018 CLINICAL DATA:  New onset of fever to 101.1 with dyspnea starting this morning and cough. Patient recently traveled to overlying [place] and [place]. EXAM: CT ANGIOGRAPHY CHEST WITH CONTRAST TECHNIQUE: Multidetector CT imaging of the chest was performed using the standard protocol during bolus administration of intravenous contrast. Multiplanar CT image reconstructions and MIPs were obtained to evaluate the vascular anatomy. CONTRAST:  61mL OMNIPAQUE IOHEXOL 350 MG/ML SOLN COMPARISON:  07/09/2018 CXR FINDINGS: Cardiovascular: Cardiomegaly without pericardial effusion or thickening. Scattered three-vessel coronary arteriosclerosis is noted. Satisfactory pulmonary arterial opacification to the proximal segmental level without acute pulmonary embolus. Aortic atherosclerosis without aneurysm. Conventional branch pattern of the great vessels without significant stenosis. Mediastinum/Nodes: No enlarged mediastinal, hilar, or axillary lymph nodes. Thyroid gland, trachea, and esophagus demonstrate no significant findings. Lungs/Pleura: Moderate right greater than left pleural effusions with adjacent compressive atelectasis. Multilobar ground-glass opacities compatible with stigmata of CHF. No pneumothorax. Upper Abdomen: No acute abnormality. Musculoskeletal: No chest wall abnormality. No acute or significant osseous findings. Review of the MIP images confirms the above findings. IMPRESSION: Cardiomegaly with moderate  bilateral pleural effusions and faint diffuse ground-glass opacities in a pattern most consistent with CHF. Coronary arteriosclerosis. No acute pulmonary embolus. Aortic Atherosclerosis (ICD10-I70.0). Electronically Signed   By: Ashley Royalty M.D.   On: 07/09/2018 14:40   Dg Chest Port 1 View  Result Date: 07/09/2018 CLINICAL DATA:  Shortness of breath, deep cough for 2 days, had pneumonia and C difficile in January, history melanoma, coronary artery disease, GERD, hypertension EXAM: PORTABLE CHEST 1 VIEW COMPARISON:  Portable exam 1112 hours compared to 02/09/2015 FINDINGS: Enlargement of cardiac silhouette with pulmonary vascular congestion. BILATERAL interstitial infiltrates primarily in the mid to lower lungs consistent with pulmonary edema. Small bibasilar  pleural effusions. No pneumothorax. IMPRESSION: CHF with small bibasilar effusions. Electronically Signed   By: Lavonia Dana M.D.   On: 07/09/2018 11:41        Scheduled Meds:  apixaban  5 mg Oral BID   atorvastatin  10 mg Oral q1800   cloNIDine  0.1 mg Oral BID   furosemide  40 mg Intravenous BID   metoprolol tartrate  25 mg Oral BID   potassium chloride  40 mEq Oral TID   Continuous Infusions:   LOS: 1 day    Time spent: 35 minutes    Edwin Dada, MD [facility] 07/10/2018, 12:56 PM     Please page through [place]:  [email or web address] Password [email or web address] If 7PM-7AM, please contact night-coverage       During this encounter: Patient Isolation: Droplet + Contact HCP PPE: Surgical mask, eye protection, gown, and gloves Patient PPE: None

## 2018-07-10 NOTE — Progress Notes (Signed)
Cancelling 2D echo to minimize exposure in light of COVID 19 crisis.  Admitted with fever, productive cough,SOB, LE edema (stopped taking diuretics 6 weeks prior)  Hospitalized in [place] in January with afib and PNA and started on diuretics and anticoagulation.    Please try to get 2D echo from hospital in [place].

## 2018-07-10 NOTE — Plan of Care (Signed)
  Problem: Education: Goal: Knowledge of General Education information will improve Description: Including pain rating scale, medication(s)/side effects and non-pharmacologic comfort measures Outcome: Progressing   Problem: Health Behavior/Discharge Planning: Goal: Ability to manage health-related needs will improve Outcome: Progressing   Problem: Coping: Goal: Level of anxiety will decrease Outcome: Progressing   Problem: Elimination: Goal: Will not experience complications related to bowel motility Outcome: Progressing   Problem: Pain Managment: Goal: General experience of comfort will improve Outcome: Progressing   Problem: Safety: Goal: Ability to remain free from injury will improve Outcome: Progressing   Problem: Skin Integrity: Goal: Risk for impaired skin integrity will decrease Outcome: Progressing   

## 2018-07-11 LAB — BASIC METABOLIC PANEL
Anion gap: 12 (ref 5–15)
BUN: 8 mg/dL (ref 8–23)
CO2: 29 mmol/L (ref 22–32)
Calcium: 8.6 mg/dL — ABNORMAL LOW (ref 8.9–10.3)
Chloride: 99 mmol/L (ref 98–111)
Creatinine, Ser: 0.99 mg/dL (ref 0.44–1.00)
GFR calc Af Amer: >60 mL/min (ref >60)
GFR calc non Af Amer: 57 mL/min — ABNORMAL LOW (ref >60)
Glucose, Bld: 97 mg/dL (ref 70–99)
Potassium: 3.2 mmol/L — ABNORMAL LOW (ref 3.5–5.1)
Sodium: 140 mmol/L (ref 135–145)

## 2018-07-11 NOTE — Discharge Instructions (Signed)

## 2018-07-11 NOTE — Progress Notes (Signed)
PROGRESS NOTE    [name]  IOE:[id number] DOB: June 01, 1945 DOA: 07/09/2018 PCP: Leighton Ruff, MD      Brief Narrative:  [name] is a 73 y.o. F with Afib on Eliquis, HTN, CAD s/p PCI >10 yrs follows with Dr. Irish Lack who presents with 1 week progressive SOB, now orthopnea and severe dyspnea.  Patient was in [place] until 10 days before admission, stopped in [place] to close up winter home then flew to [place] around 4 days PTA.  Around the time she returned to [place], she developed fatigue, malaise, DOE, nasal congestion.  This progressed until the morning of admission, she had sudden onset SOB.  This improved with sitting up, but did not abate, so she came to ER.  In ER, afebrile, RR >40.  CXR showed edema and CTA chest showed bilateral effusions, and central GGO c/w edema.  CoV testing sent due to recent travel and reported fever with EMS.       Assessment & Plan:  Acute diastolic CHF EF unknown.  Recently diagnosed with CHF in [place], was noncompliant with diuretic.  Now with leg swellng, orthopnea, elevated BNP.    Dyspnea resolved, orthopnea resolved, swelling resolved.  However, now dizzy with standing.  I/Os not docuemnted.  Weaned off O2. -Stop Lasix -Monitor Cr, push fluids and check orthostatics -K supplement -Strict I/Os, daily weights, telemetry  -Daily monitoring renal function -Echo after COVID ruled out    Rule out coronavirus/potential COVID Clinically, she had hypoxia, malaise, reported fever, bilateral infiltrates, and hypoxia, although these could also be from CHF.  She was weaned off O2 with diuresis, doubt COVID. -Trend O2 demands -Negative pressure room, droplet contact precautions  Atrial fibrillation, parox This was newly diagnosed in [place], she reports.  CHA2DS2-Vasc 5. -Continue metoprolol and apixaban -Stop aspirin  Hypertension CV disease secondary prevention BP elevated -Continue clonidine, metoprolol, furosemide -Continue  atorvastatin -Hold aspirin -Hold home amlodipine, labetalol for now  OSA -Hold CPAP while on coronavirus precautions  Hypokalemia -Continue aggressive K supplement -Supplement Mag  Mild normocytic anemia Stable        MDM and disposition: The below labs and imaging reports reviewed and summarized above.  Medication management as above including an anticoagulant.  Patient was admitted for CHF exacerbation.  She has now been weaned off of oxygen, but is standing.  We will check orthostatic blood pressures, monitor her symptoms daily, stop diuresis.  Will check creatinine in the morning, she is no longer dizzy with standing and her creatinine is stable we will likely discharge home with oral diuretics.        DVT prophylaxis: Lovenox Code Status: FULL Family Communication: None present    Consultants:   None  Procedures:   None  Antimicrobials:   None    Subjective: Leg swelling resolved, dyspnea orthopnea resolved, wheezing no longer the present.  No fever, cough, body aches, confusion.  She is dizzy with standing.  No palpitations or chest pain.    Objective: Vitals:   07/10/18 0800 07/11/18 0013 07/11/18 0538 07/11/18 0803  BP: (!) 160/72 (!) 146/68  (!) 158/65  Pulse: 76 (!) 59  64  Resp:  18    Temp: 99 F (37.2 C) 98.2 F (36.8 C)  98 F (36.7 C)  TempSrc: Oral Oral  Oral  SpO2: 96% 97%  96%  Weight:   97.1 kg   Height:        Intake/Output Summary (Last 24 hours) at 07/11/2018 1529 Last data filed  at 07/11/2018 0800 Gross per 24 hour  Intake 250 ml  Output 1300 ml  Net -1050 ml   Filed Weights   07/09/18 1042 07/10/18 0300 07/11/18 0538  Weight: 98.4 kg 97.5 kg 97.1 kg    Examination: General appearance: Thin adult female, lying in bed, appears tired. HEENT: Anicteric, conjunctival pink, lids and lashes normal.  No nasal deformity, discharge, or epistaxis.  Lips moist, dentition normal, oropharynx moist, no oral lesions, hearing  normal. Skin: Dry, no jaundice, no suspicious rashes or lesions. Cardiac: Rate and rhythm, no murmurs appreciated, JVP not visible, but no external JVD, no lower extremity edema, radial pulses normal. Respiratory: Normal respiratory rate and rhythm, lungs clear without rales or wheezes Abdomen: Abdomen soft, no tenderness to palpation, no ascites or distention MSK: No deformities or effusions. Neuro: Alert, extraocular movements intact, moves all extremities, speech fluent Psych: Sensorium intact responding to questions, attention normal, affect normal, judgment and insight appear normal.    Data Reviewed: I have personally reviewed following labs and imaging studies:  CBC: Recent Labs  Lab 07/09/18 1103  WBC 8.7  NEUTROABS 6.6  HGB 11.1*  HCT 34.1*  MCV 94.5  PLT 161   Basic Metabolic Panel: Recent Labs  Lab 07/09/18 1103 07/10/18 0238 07/11/18 0322  NA 142 142 140  K 3.1* 2.8* 3.2*  CL 109 103 99  CO2 23 27 29   GLUCOSE 145* 94 97  BUN 9 6* 8  CREATININE 0.94 1.05* 0.99  CALCIUM 9.1 8.8* 8.6*  MG  --  1.6*  --    GFR: Estimated Creatinine Clearance: 57 mL/min (by C-G formula based on SCr of 0.99 mg/dL). Liver Function Tests: Recent Labs  Lab 07/09/18 1103  AST 24  ALT 31  ALKPHOS 74  BILITOT 1.2  PROT 6.0*  ALBUMIN 3.3*   No results for input(s): LIPASE, AMYLASE in the last 168 hours. No results for input(s): AMMONIA in the last 168 hours. Coagulation Profile: No results for input(s): INR, PROTIME in the last 168 hours. Cardiac Enzymes: Recent Labs  Lab 07/09/18 1103  TROPONINI <0.03   BNP (last 3 results) No results for input(s): PROBNP in the last 8760 hours. HbA1C: No results for input(s): HGBA1C in the last 72 hours. CBG: No results for input(s): GLUCAP in the last 168 hours. Lipid Profile: No results for input(s): CHOL, HDL, LDLCALC, TRIG, CHOLHDL, LDLDIRECT in the last 72 hours. Thyroid Function Tests: No results for input(s): TSH,  T4TOTAL, FREET4, T3FREE, THYROIDAB in the last 72 hours. Anemia Panel: Recent Labs    07/09/18 1103  FERRITIN 80   Urine analysis:    Component Value Date/Time   BILIRUBINUR neg 09/19/2013 1421   PROTEINUR neg 09/19/2013 1421   UROBILINOGEN negative 09/19/2013 1421   NITRITE neg 09/19/2013 1421   LEUKOCYTESUR Negative 09/19/2013 1421   Sepsis Labs: @LABRCNTIP (procalcitonin:4,lacticacidven:4)  ) Recent Results (from the past 240 hour(s))  Culture, blood (Routine X 2) w Reflex to ID Panel     Status: None (Preliminary result)   Collection Time: 07/09/18 10:40 AM  Result Value Ref Range Status   Specimen Description BLOOD RIGHT ANTECUBITAL  Final   Special Requests   Final    BOTTLES DRAWN AEROBIC ONLY Blood Culture adequate volume   Culture   Final    NO GROWTH 2 DAYS Performed at [facility], [address] [place], [place] [address]    Report Status PENDING  Incomplete  Culture, blood (Routine X 2) w Reflex to  ID Panel     Status: None (Preliminary result)   Collection Time: 07/09/18 10:40 AM  Result Value Ref Range Status   Specimen Description BLOOD LEFT FOREARM  Final   Special Requests   Final    BOTTLES DRAWN AEROBIC AND ANAEROBIC Blood Culture results may not be optimal due to an excessive volume of blood received in culture bottles   Culture   Final    NO GROWTH 2 DAYS Performed at [facility] [address], [place], [place] [address]    Report Status PENDING  Incomplete  Respiratory Panel by PCR     Status: None   Collection Time: 07/09/18  4:35 PM  Result Value Ref Range Status   Adenovirus NOT DETECTED NOT DETECTED Final   Coronavirus 229E NOT DETECTED NOT DETECTED Final    Comment: (NOTE) The Coronavirus on the Respiratory Panel, DOES NOT test for the novel  Coronavirus (2019 nCoV)    Coronavirus HKU1 NOT DETECTED NOT DETECTED Final   Coronavirus NL63 NOT DETECTED NOT DETECTED Final   Coronavirus OC43 NOT DETECTED NOT DETECTED Final    Metapneumovirus NOT DETECTED NOT DETECTED Final   Rhinovirus / Enterovirus NOT DETECTED NOT DETECTED Final   Influenza A NOT DETECTED NOT DETECTED Final   Influenza B NOT DETECTED NOT DETECTED Final   Parainfluenza Virus 1 NOT DETECTED NOT DETECTED Final   Parainfluenza Virus 2 NOT DETECTED NOT DETECTED Final   Parainfluenza Virus 3 NOT DETECTED NOT DETECTED Final   Parainfluenza Virus 4 NOT DETECTED NOT DETECTED Final   Respiratory Syncytial Virus NOT DETECTED NOT DETECTED Final   Bordetella pertussis NOT DETECTED NOT DETECTED Final   Chlamydophila pneumoniae NOT DETECTED NOT DETECTED Final   Mycoplasma pneumoniae NOT DETECTED NOT DETECTED Final    Comment: Performed at [facility], [address] [place], [place] [address]         Radiology Studies: No results found.      Scheduled Meds: . apixaban  5 mg Oral BID  . atorvastatin  10 mg Oral q1800  . cloNIDine  0.1 mg Oral BID  . furosemide  40 mg Intravenous BID  . metoprolol tartrate  25 mg Oral BID  . potassium chloride  40 mEq Oral TID   Continuous Infusions:   LOS: 2 days    Time spent: 25 minutes    Edwin Dada, MD [facility] 07/11/2018, 3:29 PM     Please page through [place]:  [email or web address] Password [email or web address] If 7PM-7AM, please contact night-coverage       During this encounter: Patient Isolation: Droplet + Contact HCP PPE: Surgical mask, eye protection, gown, and gloves Patient PPE: None

## 2018-07-12 ENCOUNTER — Ambulatory Visit: Payer: Medicare Other | Admitting: Cardiology

## 2018-07-12 DIAGNOSIS — J069 Acute upper respiratory infection, unspecified: Secondary | ICD-10-CM

## 2018-07-12 LAB — BASIC METABOLIC PANEL
Anion gap: 8 (ref 5–15)
BUN: 9 mg/dL (ref 8–23)
CO2: 30 mmol/L (ref 22–32)
Calcium: 8.9 mg/dL (ref 8.9–10.3)
Chloride: 103 mmol/L (ref 98–111)
Creatinine, Ser: 1.04 mg/dL — ABNORMAL HIGH (ref 0.44–1.00)
GFR calc Af Amer: >60 mL/min (ref >60)
GFR calc non Af Amer: 54 mL/min — ABNORMAL LOW (ref >60)
Glucose, Bld: 104 mg/dL — ABNORMAL HIGH (ref 70–99)
Potassium: 4.1 mmol/L (ref 3.5–5.1)
Sodium: 141 mmol/L (ref 135–145)

## 2018-07-12 MED ORDER — POTASSIUM CHLORIDE CRYS ER 20 MEQ PO TBCR: 20.0000 meq | EXTENDED_RELEASE_TABLET | Freq: Every day | ORAL | 3 refills | Status: DC

## 2018-07-12 MED ORDER — FUROSEMIDE 20 MG PO TABS: 20.0000 mg | ORAL_TABLET | Freq: Every day | ORAL | 0 refills | Status: DC

## 2018-07-12 MED ORDER — MECLIZINE HCL 25 MG PO TABS: 25.0000 mg | ORAL_TABLET | Freq: Three times a day (TID) | ORAL | 0 refills | Status: DC | PRN

## 2018-07-12 MED FILL — MECLIZINE 25 MG TABLET: 25 | 10 days supply | Qty: 30 | Fill #0

## 2018-07-12 MED FILL — POTASSIUM CL ER 20 MEQ TABL: 20 | 30 days supply | Qty: 30 | Fill #0 | Status: TO

## 2018-07-12 MED FILL — FUROSEMIDE 20 MG TAB: 20 | 30 days supply | Qty: 30 | Fill #0

## 2018-07-12 NOTE — TOC Transition Note (Signed)
Transition of Care [facility]) - CM/SW Discharge Note   Patient Details  Name: [name] MRN: [id number] Date of Birth: Jan 24, 1946  Transition of Care [facility]) CM/SW Contact:  Zenon Mayo, RN Phone Number: 07/12/2018, 10:59 AM   Clinical Narrative:    From home with spouse,  She has a cane at home which is her spouse's, she has pcp, preferred pharmacy is [facility] [address]. She has transportation, [place] will bring her meds to room prior to dc per patient.    Final next level of care: Home/Self Care Barriers to Discharge: No Barriers Identified   Patient Goals and CMS Choice Patient states their goals for this hospitalization and ongoing recovery are:: to get back to her normal   Choice offered to / list presented to : NA  Discharge Placement                       Discharge Plan and Services In-house Referral: NA Discharge Planning Services: CM Consult Post Acute Care Choice: Durable Medical Equipment          DME Arranged: N/A DME Agency: [facility] HH Arranged: NA HH Agency: NA   Social Determinants of Health (SDOH) Interventions     Readmission Risk Interventions Readmission Risk Prevention Plan 07/12/2018  Post Dischage Appt Complete  Medication Screening Complete  Transportation Screening Complete  Some recent data might be hidden

## 2018-07-12 NOTE — Discharge Summary (Signed)
Physician Discharge Summary  DALENA PLANTZ HKV:[id number] DOB: March 06, 1946 DOA: 07/09/2018  PCP: Leighton Ruff, MD  Admit date: 07/09/2018 Discharge date: 07/12/2018  Admitted From: Home  Disposition:  Home   Recommendations for Outpatient Follow-up:  1. Follow up with PCP in 1-2 weeks 2. Please follow up with Cardiology 3. Please obtain BMP/CBC in one week 4. Please follow up on pending coronavirus test results      Home Health: None  Equipment/Devices: Walker  Discharge Condition: Good  CODE STATUS: FULL Diet recommendation: Cardiac  Brief/Interim Summary: [name] is a 73 y.o. F with Afib on Eliquis, HTN, CAD s/p PCI >10 yrs follows with Dr. Irish Lack who presents with 1 week progressive SOB, now orthopnea and severe dyspnea.  Patient was in [place] until 10 days before admission, stopped in [place] to close up winter home then flew to [place] around 4 days PTA.  Around the time she returned to [place], she developed fatigue, malaise, DOE, nasal congestion.  This progressed until the morning of admission, she had sudden onset SOB.  This improved with sitting up, but did not abate, so she came to ER.  In ER, afebrile, RR >40.  CXR showed edema and CTA chest showed bilateral effusions, and central GGO c/w edema.  CoV testing sent due to recent travel and reported fever with EMS.         PRINCIPAL HOSPITAL DIAGNOSIS: Acute diastolic CHF    Discharge Diagnoses:   Acute diastolic CHF EF unknown.  Recently diagnosed with CHF in [place], was noncompliant with diuretic after discharge.  Now with leg swellng, orthopnea, elevated BNP.    Treated with Lasix IV here, dyspnea subsequently resolved, orthopnea resolved, swelling resolved.  Weaned off O2.  Discharged with oral Lasix 20 daily, instructed on daily weights.  Will need follow up with her primary cardiologist, BMP in 1 week.      Rule out coronavirus/potential COVID Clinically, she had hypoxia, malaise, reported  fever, bilateral infiltrates, and hypoxia, although these could also be from CHF.  She was weaned off O2 with diuresis, doubt COVID but test pending.  Atrial fibrillation, parox This was newly diagnosed in [place], she reports.  CHA2DS2-Vasc 5.  On Eliquis  Hypertension CV disease secondary prevention  OSA  Hypokalemia  Mild normocytic anemia Stable  Benign positional vertigo Endorses brief vertigo, starting just prior to admission.  Lasting minutes, worse with head movement.  Rotational nystagmus with Epley maneuver, worst on the right.          Discharge Instructions  Discharge Instructions    Diet - low sodium heart healthy   Complete by:  As directed    Discharge instructions   Complete by:  As directed    From Dr. Loleta Books: You were admitted for 17 lb weight gain, leg swelling and trouble breathing. Your chest x-ray and lab work confirmed that this was from a congestive heart failure flare.  You were treated with diuretics (Lasix/furosemide) through the IV and the excess water was taken off.  Resume Lasix 20 mg daily Take a potassium supplement with your Lasix Have your primary care doctor check your labs in 1 week, or as soon as able (with your coronavirus test result) Call Dr. Hassell Done office for a follow up in 3-4 weeks Weigh yourself today when you get home.  That is your "Dry weight" From now on, weigh yourself daily.  IF you ever see MORE THAN 5LBS weight gain, call Dr. Hassell Done office that day for instructions.  For your vertigo(dizziness): This is from benign positional vertigo. I encourage you to google this. Some people try meclizine, which I have provided for you as a prescription Meclizine blunts some of the symptoms of spinning for a few hours, and your body eventually recovers from the episode    For your bladder,  You may finish the prescription for Cipro if you would like. Your urine was without infection when we tested it when  you arrived, so my guess is that your bladder symptoms are from concentrated urine, and that this will abate in the next few days. If it gets worse, or you have a fever, or develop new pain with peeing, call your PCP immediately.   Increase activity slowly   Complete by:  As directed      Allergies as of 07/12/2018      Reactions   Azithromycin Swelling   Other Itching, Swelling, Other (See Comments), Cough   Horse products   Penicillins Swelling   Has patient had a PCN reaction causing immediate rash, facial/tongue/throat swelling, SOB or lightheadedness with hypotension: Yes Has patient had a PCN reaction causing severe rash involving mucus membranes or skin necrosis: No Has patient had a PCN reaction that required hospitalization: No Has patient had a PCN reaction occurring within the last 10 years: No If all of the above answers are "NO", then may proceed with Cephalosporin use.   Tetanus Toxoids Swelling   Gadolinium Derivatives Hives, Itching   Pt stated that her left arm was itching and I noticed that she had two hives on her chest. No difficulty breathing, sneezing. One 25mg  of benadryl was ordered by Dr. Jimmye Norman. Pt remained at facility and was monitored before going home. -ldrake   Morphine And Related Nausea And Vomiting   Augmentin [amoxicillin-pot Clavulanate] Rash   Has patient had a PCN reaction causing immediate rash, facial/tongue/throat swelling, SOB or lightheadedness with hypotension: Yes Has patient had a PCN reaction causing severe rash involving mucus membranes or skin necrosis: Yes Has patient had a PCN reaction that required hospitalization:No Has patient had a PCN reaction occurring within the last 10 years: No If all of the above answers are "NO", then may proceed with Cephalosporin use.      Medication List    STOP taking these medications   ciprofloxacin 250 MG tablet Commonly known as:  CIPRO   metoprolol tartrate 50 MG tablet Commonly known as:   LOPRESSOR     TAKE these medications   amLODipine 5 MG tablet Commonly known as:  NORVASC Take 5 mg by mouth daily.   aspirin EC 81 MG tablet Take 81 mg by mouth daily.   atorvastatin 10 MG tablet Commonly known as:  LIPITOR Take 1 tablet (10 mg total) by mouth daily.   cloNIDine 0.1 MG tablet Commonly known as:  CATAPRES Take 0.1 mg by mouth 2 (two) times daily.   Eliquis 5 MG Tabs tablet Generic drug:  apixaban Take 5 mg by mouth 2 (two) times daily.   fluticasone 50 MCG/ACT nasal spray Commonly known as:  FLONASE Place 2 sprays into both nostrils daily as needed for allergies.   furosemide 20 MG tablet Commonly known as:  LASIX Take 1 tablet (20 mg total) by mouth daily. What changed:  when to take this   isosorbide mononitrate 60 MG 24 hr tablet Commonly known as:  IMDUR Take 1 tablet (60 mg total) by mouth daily. Please keep upcoming appointment for further refills   labetalol 200  MG tablet Commonly known as:  NORMODYNE Take 200 mg by mouth 2 (two) times daily.   lisinopril 20 MG tablet Commonly known as:  PRINIVIL,ZESTRIL Take 1 tablet (20 mg total) by mouth daily.   meclizine 25 MG tablet Commonly known as:  ANTIVERT Take 1 tablet (25 mg total) by mouth 3 (three) times daily as needed for dizziness.   nitroGLYCERIN 0.4 MG SL tablet Commonly known as:  NITROSTAT DISSOLVE 1 TABLET UNDER THE TONGUE EVERY 5 MINUTES AS NEEDED FOR CHEST PAIN What changed:  See the new instructions.   potassium chloride SA 20 MEQ tablet Commonly known as:  K-DUR,KLOR-CON Take 1 tablet (20 mEq total) by mouth daily. What changed:  when to take this   ProAir HFA 108 (90 Base) MCG/ACT inhaler Generic drug:  albuterol Inhale 1 puff into the lungs every 4 (four) hours as needed for wheezing or shortness of breath. q4h   Rewetting Drops Soln Place 1 drop 3 (three) times daily as needed into both eyes (for dry/irritated contact lenses).   Tylenol 8 Hour Arthritis Pain 650 MG  CR tablet Generic drug:  acetaminophen Take 1,300 mg every 8 (eight) hours as needed by mouth for pain.            Durable Medical Equipment  (From admission, onward)         Start     Ordered   07/12/18 1039  For home use only DME Walker rolling  [facility])  Once    Question:  Patient needs a walker to treat with the following condition  Answer:  Acute diastolic CHF (congestive heart failure) ([place])   07/12/18 1048         Follow-up Information    Leighton Ruff, MD. Schedule an appointment as soon as possible for a visit in 1 week.   Specialty:  Family Medicine Why:  Aug 16, 2018 at 3:00 p.m. Contact information: [place] [address] [phone]        Jettie Booze, MD. Schedule an appointment as soon as possible for a visit in 3 weeks.   Specialties:  Cardiology, Radiology, Interventional Cardiology Why:  August 03, 2018 at 10:30 a.m. with PA for Dr. Irish Lack. Contact information: [address] [place] [address] [phone]        [facility] Follow up.   Why:  rolling walker         Allergies  Allergen Reactions  . Azithromycin Swelling  . Other Itching, Swelling, Other (See Comments) and Cough    Horse products  . Penicillins Swelling    Has patient had a PCN reaction causing immediate rash, facial/tongue/throat swelling, SOB or lightheadedness with hypotension: Yes Has patient had a PCN reaction causing severe rash involving mucus membranes or skin necrosis: No Has patient had a PCN reaction that required hospitalization: No Has patient had a PCN reaction occurring within the last 10 years: No If all of the above answers are "NO", then may proceed with Cephalosporin use.   . Tetanus Toxoids Swelling  . Gadolinium Derivatives Hives and Itching    Pt stated that her left arm was itching and I noticed that she had two hives on her chest. No difficulty breathing, sneezing. One 25mg  of benadryl was  ordered by Dr. Jimmye Norman. Pt remained at facility and was monitored before going home. -ldrake  . Morphine And Related Nausea And Vomiting  . Augmentin [Amoxicillin-Pot Clavulanate] Rash    Has patient had a  PCN reaction causing immediate rash, facial/tongue/throat swelling, SOB or lightheadedness with hypotension: Yes Has patient had a PCN reaction causing severe rash involving mucus membranes or skin necrosis: Yes Has patient had a PCN reaction that required hospitalization:No Has patient had a PCN reaction occurring within the last 10 years: No If all of the above answers are "NO", then may proceed with Cephalosporin use.     Consultations:  None   Procedures/Studies: Ct Angio Chest Pe W And/or Wo Contrast  Result Date: 07/09/2018 CLINICAL DATA:  New onset of fever to 101.1 with dyspnea starting this morning and cough. Patient recently traveled to overlying [place] and [place]. EXAM: CT ANGIOGRAPHY CHEST WITH CONTRAST TECHNIQUE: Multidetector CT imaging of the chest was performed using the standard protocol during bolus administration of intravenous contrast. Multiplanar CT image reconstructions and MIPs were obtained to evaluate the vascular anatomy. CONTRAST:  50mL OMNIPAQUE IOHEXOL 350 MG/ML SOLN COMPARISON:  07/09/2018 CXR FINDINGS: Cardiovascular: Cardiomegaly without pericardial effusion or thickening. Scattered three-vessel coronary arteriosclerosis is noted. Satisfactory pulmonary arterial opacification to the proximal segmental level without acute pulmonary embolus. Aortic atherosclerosis without aneurysm. Conventional branch pattern of the great vessels without significant stenosis. Mediastinum/Nodes: No enlarged mediastinal, hilar, or axillary lymph nodes. Thyroid gland, trachea, and esophagus demonstrate no significant findings. Lungs/Pleura: Moderate right greater than left pleural effusions with adjacent compressive atelectasis. Multilobar ground-glass opacities compatible with  stigmata of CHF. No pneumothorax. Upper Abdomen: No acute abnormality. Musculoskeletal: No chest wall abnormality. No acute or significant osseous findings. Review of the MIP images confirms the above findings. IMPRESSION: Cardiomegaly with moderate bilateral pleural effusions and faint diffuse ground-glass opacities in a pattern most consistent with CHF. Coronary arteriosclerosis. No acute pulmonary embolus. Aortic Atherosclerosis (ICD10-I70.0). Electronically Signed   By: Ashley Royalty M.D.   On: 07/09/2018 14:40   Dg Chest Port 1 View  Result Date: 07/09/2018 CLINICAL DATA:  Shortness of breath, deep cough for 2 days, had pneumonia and C difficile in [place], history melanoma, coronary artery disease, GERD, hypertension EXAM: PORTABLE CHEST 1 VIEW COMPARISON:  Portable exam 1112 hours compared to 02/09/2015 FINDINGS: Enlargement of cardiac silhouette with pulmonary vascular congestion. BILATERAL interstitial infiltrates primarily in the mid to lower lungs consistent with pulmonary edema. Small bibasilar pleural effusions. No pneumothorax. IMPRESSION: CHF with small bibasilar effusions. Electronically Signed   By: Lavonia Dana M.D.   On: 07/09/2018 11:41      Subjective: Still vertigo with head movement.  Otherwise feeling well.  No more swelling, orthopnea, no dyspnea on exertion, chest pain, cough.  Discharge Exam: Vitals:   07/11/18 2226 07/12/18 0902  BP:  (!) 162/70  Pulse:    Resp:  16  Temp:  98.7 F (37.1 C)  SpO2: 93% 92%   Vitals:   07/11/18 1639 07/11/18 2213 07/11/18 2226 07/12/18 0902  BP: (!) 160/67 (!) 154/67  (!) 162/70  Pulse: 93 80    Resp: 16 16  16   Temp: 98.4 F (36.9 C) 98.8 F (37.1 C)  98.7 F (37.1 C)  TempSrc: Oral Oral  Oral  SpO2: 90% 91% 93% 92%  Weight:      Height:        General: Pt is alert, awake, not in acute distress Cardiovascular: RRR, nl S1-S2, no murmurs appreciated.   No LE edema.   Respiratory: Normal respiratory rate and rhythm.  CTAB  without rales or wheezes. Abdominal: Abdomen soft and non-tender.  No distension or HSM.   Neuro/Psych: Rotational nystagmus with Epley to  the right.  Cranial nerves normal.  Speech fluent.  Strength symmetric in upper and lower extremities.  Judgment and insight appear normal.   The results of significant diagnostics from this hospitalization (including imaging, microbiology, ancillary and laboratory) are listed below for reference.     Microbiology: Recent Results (from the past 240 hour(s))  Culture, blood (Routine X 2) w Reflex to ID Panel     Status: None (Preliminary result)   Collection Time: 07/09/18 10:40 AM  Result Value Ref Range Status   Specimen Description BLOOD RIGHT ANTECUBITAL  Final   Special Requests   Final    BOTTLES DRAWN AEROBIC ONLY Blood Culture adequate volume   Culture   Final    NO GROWTH 3 DAYS Performed at [facility], [address] [place], [place] [address]    Report Status PENDING  Incomplete  Culture, blood (Routine X 2) w Reflex to ID Panel     Status: None (Preliminary result)   Collection Time: 07/09/18 10:40 AM  Result Value Ref Range Status   Specimen Description BLOOD LEFT FOREARM  Final   Special Requests   Final    BOTTLES DRAWN AEROBIC AND ANAEROBIC Blood Culture results may not be optimal due to an excessive volume of blood received in culture bottles   Culture   Final    NO GROWTH 3 DAYS Performed at [facility], [place] [address] [place], [place] [address]    Report Status PENDING  Incomplete  Respiratory Panel by PCR     Status: None   Collection Time: 07/09/18  4:35 PM  Result Value Ref Range Status   Adenovirus NOT DETECTED NOT DETECTED Final   Coronavirus 229E NOT DETECTED NOT DETECTED Final    Comment: (NOTE) The Coronavirus on the Respiratory Panel, DOES NOT test for the novel  Coronavirus (2019 nCoV)    Coronavirus HKU1 NOT DETECTED NOT DETECTED Final   Coronavirus NL63 NOT DETECTED NOT DETECTED Final    Coronavirus OC43 NOT DETECTED NOT DETECTED Final   Metapneumovirus NOT DETECTED NOT DETECTED Final   Rhinovirus / Enterovirus NOT DETECTED NOT DETECTED Final   Influenza A NOT DETECTED NOT DETECTED Final   Influenza B NOT DETECTED NOT DETECTED Final   Parainfluenza Virus 1 NOT DETECTED NOT DETECTED Final   Parainfluenza Virus 2 NOT DETECTED NOT DETECTED Final   Parainfluenza Virus 3 NOT DETECTED NOT DETECTED Final   Parainfluenza Virus 4 NOT DETECTED NOT DETECTED Final   Respiratory Syncytial Virus NOT DETECTED NOT DETECTED Final   Bordetella pertussis NOT DETECTED NOT DETECTED Final   Chlamydophila pneumoniae NOT DETECTED NOT DETECTED Final   Mycoplasma pneumoniae NOT DETECTED NOT DETECTED Final    Comment: Performed at [facility], [address], [place], [place] [address]     Labs: BNP (last 3 results) Recent Labs    07/09/18 1103  BNP 401.0*   Basic Metabolic Panel: Recent Labs  Lab 07/09/18 1103 07/10/18 0238 07/11/18 0322 07/12/18 0614  NA 142 142 140 141  K 3.1* 2.8* 3.2* 4.1  CL 109 103 99 103  CO2 23 27 29 30   GLUCOSE 145* 94 97 104*  BUN 9 6* 8 9  CREATININE 0.94 1.05* 0.99 1.04*  CALCIUM 9.1 8.8* 8.6* 8.9  MG  --  1.6*  --   --    Liver Function Tests: Recent Labs  Lab 07/09/18 1103  AST 24  ALT 31  ALKPHOS 74  BILITOT 1.2  PROT  6.0*  ALBUMIN 3.3*   No results for input(s): LIPASE, AMYLASE in the last 168 hours. No results for input(s): AMMONIA in the last 168 hours. CBC: Recent Labs  Lab 07/09/18 1103  WBC 8.7  NEUTROABS 6.6  HGB 11.1*  HCT 34.1*  MCV 94.5  PLT 179   Cardiac Enzymes: Recent Labs  Lab 07/09/18 1103  TROPONINI <0.03   BNP: Invalid input(s): POCBNP CBG: No results for input(s): GLUCAP in the last 168 hours. D-Dimer No results for input(s): DDIMER in the last 72 hours. Hgb A1c No results for input(s): HGBA1C in the last 72 hours. Lipid Profile No results for input(s): CHOL, HDL, LDLCALC, TRIG, CHOLHDL,  LDLDIRECT in the last 72 hours. Thyroid function studies No results for input(s): TSH, T4TOTAL, T3FREE, THYROIDAB in the last 72 hours.  Invalid input(s): FREET3 Anemia work up No results for input(s): VITAMINB12, FOLATE, FERRITIN, TIBC, IRON, RETICCTPCT in the last 72 hours. Urinalysis    Component Value Date/Time   BILIRUBINUR neg 09/19/2013 1421   PROTEINUR neg 09/19/2013 1421   UROBILINOGEN negative 09/19/2013 1421   NITRITE neg 09/19/2013 1421   LEUKOCYTESUR Negative 09/19/2013 1421   Sepsis Labs Invalid input(s): PROCALCITONIN,  WBC,  LACTICIDVEN Microbiology Recent Results (from the past 240 hour(s))  Culture, blood (Routine X 2) w Reflex to ID Panel     Status: None (Preliminary result)   Collection Time: 07/09/18 10:40 AM  Result Value Ref Range Status   Specimen Description BLOOD RIGHT ANTECUBITAL  Final   Special Requests   Final    BOTTLES DRAWN AEROBIC ONLY Blood Culture adequate volume   Culture   Final    NO GROWTH 3 DAYS Performed at [facility], [address], [place], [place] [address]    Report Status PENDING  Incomplete  Culture, blood (Routine X 2) w Reflex to ID Panel     Status: None (Preliminary result)   Collection Time: 07/09/18 10:40 AM  Result Value Ref Range Status   Specimen Description BLOOD LEFT FOREARM  Final   Special Requests   Final    BOTTLES DRAWN AEROBIC AND ANAEROBIC Blood Culture results may not be optimal due to an excessive volume of blood received in culture bottles   Culture   Final    NO GROWTH 3 DAYS Performed at [facility], [place] [address] [place], [place] [address]    Report Status PENDING  Incomplete  Respiratory Panel by PCR     Status: None   Collection Time: 07/09/18  4:35 PM  Result Value Ref Range Status   Adenovirus NOT DETECTED NOT DETECTED Final   Coronavirus 229E NOT DETECTED NOT DETECTED Final    Comment: (NOTE) The Coronavirus on the Respiratory Panel, DOES NOT test for the novel  Coronavirus  (2019 nCoV)    Coronavirus HKU1 NOT DETECTED NOT DETECTED Final   Coronavirus NL63 NOT DETECTED NOT DETECTED Final   Coronavirus OC43 NOT DETECTED NOT DETECTED Final   Metapneumovirus NOT DETECTED NOT DETECTED Final   Rhinovirus / Enterovirus NOT DETECTED NOT DETECTED Final   Influenza A NOT DETECTED NOT DETECTED Final   Influenza B NOT DETECTED NOT DETECTED Final   Parainfluenza Virus 1 NOT DETECTED NOT DETECTED Final   Parainfluenza Virus 2 NOT DETECTED NOT DETECTED Final   Parainfluenza Virus 3 NOT DETECTED NOT DETECTED Final   Parainfluenza Virus 4 NOT DETECTED NOT DETECTED Final   Respiratory Syncytial Virus NOT DETECTED NOT DETECTED Final   Bordetella pertussis  NOT DETECTED NOT DETECTED Final   Chlamydophila pneumoniae NOT DETECTED NOT DETECTED Final   Mycoplasma pneumoniae NOT DETECTED NOT DETECTED Final    Comment: Performed at [facility], [place] [address], [place], [place] [address]     Time coordinating discharge: 40 minutes      SIGNED:   Edwin Dada, MD  [facility] 07/12/2018, 5:20 PM   During this encounter: Patient Isolation: Droplet + Contact HCP PPE: Surgical mask, eye protection, gown, and gloves Patient PPE: None

## 2018-07-12 NOTE — Progress Notes (Signed)
Pt has been given all discharge instructions and verbalized understanding of all. Pt is aware of  follow up appts and toc pharmacy has delivered discharge meds to her.  Pt has rolling walker to take home at discharge. Will be discharged home with all belongings via wc to the care of her husband.

## 2018-07-13 ENCOUNTER — Telehealth: Payer: Self-pay | Admitting: Interventional Cardiology

## 2018-07-13 LAB — NOVEL CORONAVIRUS, NAA (HOSP ORDER, SEND-OUT TO REF LAB; TAT 18-24 HRS): SARS-CoV-2, NAA: NOT DETECTED

## 2018-07-13 NOTE — Telephone Encounter (Signed)
Called and made patient aware. Patient will call back next week to report COVID 19 results. Patient will let us know if she has any S/Sx of bleeding in the meantime.

## 2018-07-13 NOTE — Telephone Encounter (Signed)
Called and made patient aware that per hospital discharge instructions the patient should take lasix 20 mg QD. Instructed patient to monitor weight daily and let us know if she has >3 lb weight gain in 24 hours or >5 lb weight gain in 1 week. Instructed patient to limit salt in her diet and let us know if she developed increased SOB or swelling. Patient verbalized understanding and thanked me for the call.

## 2018-07-13 NOTE — Telephone Encounter (Signed)
New message  Pt c/o medication issue:  1. Name of Medication: lasix 20 mg  2. How are you currently taking this medication (dosage and times per day)? 1 time daily  3. Are you having a reaction (difficulty breathing--STAT)? No   4. What is your medication issue? Patient states the hospital she was taking 40 mg and wants to know if she should still be taking 40 mg?

## 2018-07-13 NOTE — Telephone Encounter (Signed)
Very high CHADS-vasc score.  Would have her see EP for ILR before stopping her Eliquis.  WOuld hold off on this referral until COVID 19 issues have resolved. For now, xontinue Eliquis.  Let us know if she has bleeding issues.

## 2018-07-13 NOTE — Telephone Encounter (Signed)
Patient states that while she was in the hospital that they recommended that she have an echocardiogram outpatient. She also states that she was originally in [place] and was in the hospital for pneumonia and had Afib at that time and was started on Eliquis. She states that she saw a cardiologist while there who also recommended monitor to assess afib burden to see if she could come off of Eliquis. Will forward to [place] for review.  PATIENT IS WAITING ON PENDING COVID-19 RESULTS

## 2018-07-13 NOTE — Telephone Encounter (Signed)
New message   Patient states that she was advised while she was in the hospital that she needed to have an echo setup. I did not see an order in the system. Please call the patient to discuss.

## 2018-07-14 LAB — CULTURE, BLOOD (ROUTINE X 2)
Culture: NO GROWTH
Culture: NO GROWTH
Special Requests: ADEQUATE

## 2018-07-19 ENCOUNTER — Telehealth: Payer: Self-pay | Admitting: Interventional Cardiology

## 2018-07-19 NOTE — Telephone Encounter (Signed)
Spoke to patient. Patient recently discharged from hospital on 3/30. Patient states that she has been very dizzy upon standing and has been very tired. Denies syncope. Patient was seen by Dr. Drema Dallas today and was hypotensive. Paramedic is currently with the patient and wanted to arrange visit with cardiology so that the patient doesn't end up back in the hospital. He states that the patient is in NSR and BP lying on the stretcher is 115/64 HR 62 and with standing BP 84/52 70. He states that he has cycled BP multiple times and it has remained 110/60 while sitting. Patient states that she feels okay right now. Patient had multiple changes to her BP medicines while in the hospital, see below:  - Amlodipine 5 mg QD (previously 10 mg QD) - clonidine 0.1 mg BID (new) - lasix 20 mg QD (previously on hctz 25 mg QD) - imdur 60 mg QD (same as before) - lisinopril 20 mg QD (previously 10 mg QD) - labetalol 200 mg BID (previously on metoprolol 50 mg BID)  Instructed for patient to hold PM clonidine and labetalol, change positions slowly, and stay hydrated. Arranged for patient to have VIDEO visit with Dr. Irish Lack tomorrow. Patient will continue to monitor BP. Patient verbalized understanding and will let us know if her SX change or worsen.

## 2018-07-19 NOTE — Telephone Encounter (Signed)
New message:   Patient calling she is at [facility] getting checked out. Patient just got release from hospital a week ago. Hospital filled 6 medication for her BP. Patient would like to be seen today. Or If her medications can be changed. Patient get very dizzy about lunch time.

## 2018-07-20 ENCOUNTER — Telehealth (INDEPENDENT_AMBULATORY_CARE_PROVIDER_SITE_OTHER): Payer: Medicare Other | Admitting: Interventional Cardiology

## 2018-07-20 ENCOUNTER — Other Ambulatory Visit: Payer: Self-pay

## 2018-07-20 ENCOUNTER — Encounter: Payer: Self-pay | Admitting: Interventional Cardiology

## 2018-07-20 DIAGNOSIS — I2510 Atherosclerotic heart disease of native coronary artery without angina pectoris: Secondary | ICD-10-CM | POA: Diagnosis not present

## 2018-07-20 DIAGNOSIS — I10 Essential (primary) hypertension: Secondary | ICD-10-CM | POA: Diagnosis not present

## 2018-07-20 DIAGNOSIS — E782 Mixed hyperlipidemia: Secondary | ICD-10-CM | POA: Diagnosis not present

## 2018-07-20 DIAGNOSIS — I5031 Acute diastolic (congestive) heart failure: Secondary | ICD-10-CM

## 2018-07-20 DIAGNOSIS — I2583 Coronary atherosclerosis due to lipid rich plaque: Secondary | ICD-10-CM

## 2018-07-20 MED ORDER — FUROSEMIDE 20 MG PO TABS: ORAL_TABLET | ORAL | 3 refills | Status: DC

## 2018-07-20 NOTE — Progress Notes (Signed)
Virtual Visit via Video Note   This visit type was conducted due to national recommendations for restrictions regarding the COVID-19 Pandemic (e.g. social distancing) in an effort to limit this patient's exposure and mitigate transmission in our community.  Due to her co-morbid illnesses, this patient is at least at moderate risk for complications without adequate follow up.  This format is felt to be most appropriate for this patient at this time.  All issues noted in this document were discussed and addressed.  A limited physical exam was performed with this format.  Please refer to the patient's chart for her consent to telehealth for [facility].   Evaluation Performed:  Follow-up visit  Date:  07/20/2018   ID:  [name], [name] 04-Mar-1946, MRN [id number]  Patient Location: Home  Provider Location: Home  PCP:  Leighton Ruff, MD  Cardiologist:  Larae Grooms, MD  Electrophysiologist:  None   Chief Complaint:  CAD  History of Present Illness:    [name] is a 73 y.o. female who presents via audio/video conferencing for a telehealth visit today.    She has had CAD. She had PCI in 1999 and 2002. She had a cath without obstructive disease in 2015.  She had acute diastolic heart failure after a trip to [place] and [place].  She returned in March 2020.  She was tested for COVID and this was negative.  She was diuresed 20 lbs.  She may have eaten more salt.  Troponin was negative.  BNP was elevated.   She was discharged in Lasix 20 mg daily.  Her BPs at home, her BP has been in the 016-010 range systolic.  Higher readings in the morning.    Yesterday, BP was in the 93A systolic.  She report some dizziness and nausea. EMS was called at her PMD office, but she did not go to the hospital.    Denies : Chest pain.  Leg edema. Nitroglycerin use. Orthopnea. Palpitations. Paroxysmal nocturnal dyspnea. Shortness of breath. Syncope.   The patient does not have symptoms  concerning for COVID-19 infection (fever, chills, cough, or new shortness of breath).    Past Medical History:  Diagnosis Date  . Anxiety   . Arthritis   . Bone spur   . Coronary artery disease    stent - 1999  . Depression   . Fibrocystic breast changes   . GERD (gastroesophageal reflux disease)   . Glucosuria   . Hernia, inguinal, left 10/2002  . Hypertension   . Melanoma ([place]) 10/2008   right shoulder and arm  . Menorrhagia   . Myocardial infarction ([place]) 1999   Past Surgical History:  Procedure Laterality Date  . ANGIOPLASTY  2000  . APPENDECTOMY    . bone chip removed from left foot     . CHOLECYSTECTOMY    . COLONOSCOPY WITH PROPOFOL N/A 03/03/2017   Procedure: COLONOSCOPY WITH PROPOFOL;  Surgeon: Juanita Craver, MD;  Location: [facility];  Service: Endoscopy;  Laterality: N/A;  . CORONARY ANGIOPLASTY WITH STENT PLACEMENT  10/99  . ESOPHAGOGASTRODUODENOSCOPY (EGD) WITH PROPOFOL N/A 03/03/2017   Procedure: ESOPHAGOGASTRODUODENOSCOPY (EGD) WITH PROPOFOL;  Surgeon: Juanita Craver, MD;  Location: [facility];  Service: Endoscopy;  Laterality: N/A;  . HERNIA REPAIR     umbilical   . HYSTEROSCOPY  2/98   D&C (polyps)  . LEFT HEART CATHETERIZATION WITH CORONARY ANGIOGRAM N/A 01/25/2014   Procedure: LEFT HEART CATHETERIZATION WITH CORONARY ANGIOGRAM;  Surgeon: Jettie Booze, MD;  Location:  [facility] CATH LAB;  Service: Cardiovascular;  Laterality: N/A;  . NODE DISSECTION     neg  . TOTAL ABDOMINAL HYSTERECTOMY     LSO     Failed TVH     Current Meds  Medication Sig  . acetaminophen (TYLENOL 8 HOUR ARTHRITIS PAIN) 650 MG CR tablet Take 1,300 mg every 8 (eight) hours as needed by mouth for pain.  Marland Kitchen amLODipine (NORVASC) 5 MG tablet Take 5 mg by mouth daily.  Marland Kitchen aspirin EC 81 MG tablet Take 81 mg by mouth daily.  Marland Kitchen atorvastatin (LIPITOR) 10 MG tablet Take 1 tablet (10 mg total) by mouth daily.  . cloNIDine (CATAPRES) 0.1 MG tablet Take 0.1 mg by mouth 2 (two) times daily.  Marland Kitchen  ELIQUIS 5 MG TABS tablet Take 5 mg by mouth 2 (two) times daily.  . fluticasone (FLONASE) 50 MCG/ACT nasal spray Place 2 sprays into both nostrils daily as needed for allergies.  . furosemide (LASIX) 20 MG tablet Take 1 tablet (20 mg total) by mouth daily.  . isosorbide mononitrate (IMDUR) 60 MG 24 hr tablet Take 1 tablet (60 mg total) by mouth daily. Please keep upcoming appointment for further refills  . labetalol (NORMODYNE) 200 MG tablet Take 200 mg by mouth 2 (two) times daily.  Marland Kitchen lisinopril (PRINIVIL,ZESTRIL) 20 MG tablet Take 1 tablet (20 mg total) by mouth daily.  . meclizine (ANTIVERT) 25 MG tablet Take 1 tablet (25 mg total) by mouth 3 (three) times daily as needed for dizziness.  . nitroGLYCERIN (NITROSTAT) 0.4 MG SL tablet DISSOLVE 1 TABLET UNDER THE TONGUE EVERY 5 MINUTES AS NEEDED FOR CHEST PAIN  . potassium chloride SA (K-DUR,KLOR-CON) 20 MEQ tablet Take 1 tablet (20 mEq total) by mouth daily.  Marland Kitchen PROAIR HFA 108 (90 Base) MCG/ACT inhaler Inhale 1 puff into the lungs every 4 (four) hours as needed for wheezing or shortness of breath. q4h  . Soft Lens Products (REWETTING DROPS) SOLN Place 1 drop 3 (three) times daily as needed into both eyes (for dry/irritated contact lenses).     Allergies:   Azithromycin; Other; Penicillins; Tetanus toxoids; Gadolinium derivatives; Morphine and related; and Augmentin [amoxicillin-pot clavulanate]   Social History   Tobacco Use  . Smoking status: Never Smoker  . Smokeless tobacco: Never Used  Substance Use Topics  . Alcohol use: Yes    Alcohol/week: 1.0 standard drinks    Types: 1 Standard drinks or equivalent per week    Comment: occ glass of wine  . Drug use: No    Frequency: 5.0 times per week     Family Hx: The patient's family history includes Cancer in her father; Diabetes in her mother; Heart attack in her father; Heart disease in her father; Hypertension in her father and mother; Stroke in her mother.  ROS:   Please see the  history of present illness.    dizziness All other systems reviewed and are negative.   Prior CV studies:   The following studies were reviewed today:  Hospital records reviewed  Labs/Other Tests and Data Reviewed:    EKG:  NSR, on 3/27/202  Recent Labs: 07/09/2018: ALT 31; B Natriuretic Peptide 282.1; Hemoglobin 11.1; Platelets 179 07/10/2018: Magnesium 1.6 07/12/2018: BUN 9; Creatinine, Ser 1.04; Potassium 4.1; Sodium 141   Recent Lipid Panel No results found for: CHOL, TRIG, HDL, CHOLHDL, LDLCALC, LDLDIRECT  Wt Readings from Last 3 Encounters:  07/20/18 196 lb (88.9 kg)  07/11/18 214 lb 1.1 oz (97.1 kg)  03/29/18 226 lb (  102.5 kg)     Objective:    Vital Signs:  BP (!) 153/95   Pulse 79   Ht 5\' 3"  (1.6 m)   Wt 196 lb (88.9 kg)   BMI 34.72 kg/m    Well nourished, well developed female in no acute distress. No shortness of breath  ASSESSMENT & PLAN:    1. CAD: No angina.  Continue aggressive secondary prevention.  Stop aspirin since Eliquis started for AFib in [place] in 2020.     2. HTN: SOme low readings.  Decrease frequency of Lasix to 5 days/week.  If dizzy and SBP < 100, would have her hold amlodipine.  Low readings may be related to massive diuresis.   3. Hyperlipidemia: LDL 79 in 10/19.   Continue lipid lowering therapy.  4. AFib: new onset.  May have been related to pneumonia.  COntinue  With plan for echo when virus settles down.  Would consider ILR to see if she can come off of Eliquis.  She was asymptomatic with her AFib in [place].   COVID-19 Education: The signs and symptoms of COVID-19 were discussed with the patient and how to seek care for testing (follow up with PCP or arrange E-visit).  The importance of social distancing was discussed today.  Time:   Today, I have spent 20 minutes with the patient with telehealth technology discussing the above problems.     Medication Adjustments/Labs and Tests Ordered: Current medicines are reviewed at length with  the patient today.  Concerns regarding medicines are outlined above.  Tests Ordered: No orders of the defined types were placed in this encounter.  Medication Changes: No orders of the defined types were placed in this encounter.   Disposition:  Follow up in 2 week(s) to check BP  Signed, Larae Grooms, MD  07/20/2018 11:23 AM    [facility]

## 2018-07-20 NOTE — Patient Instructions (Signed)
Medication Instructions:  Your physician has recommended you make the following change in your medication:   1. DECREASE: furosemide (lasix) to 20 mg 5 days a week  2. STOP: Aspirin  3. HOLD: amlodipine if your systolic blood pressure (top number) is less than 100   Lab work: None Ordered  If you have labs (blood work) drawn today and your tests are completely normal, you will receive your results only by: Marland Kitchen MyChart Message (if you have MyChart) OR . A paper copy in the mail If you have any lab test that is abnormal or we need to change your treatment, we will call you to review the results.  Testing/Procedures: None ordered  Follow-Up: . Follow up with Dr. Irish Lack via VIDEO Visit on 08/03/18 at 11:40 AM  Any Other Special Instructions Will Be Listed Below (If Applicable).  CONTINUE TO MONITOR YOUR BLOOD PRESSURE

## 2018-07-26 ENCOUNTER — Other Ambulatory Visit: Payer: Self-pay | Admitting: Interventional Cardiology

## 2018-07-26 MED ORDER — LISINOPRIL 20 MG PO TABS: 20.0000 mg | ORAL_TABLET | Freq: Every day | ORAL | 3 refills | Status: DC

## 2018-08-03 ENCOUNTER — Telehealth (INDEPENDENT_AMBULATORY_CARE_PROVIDER_SITE_OTHER): Payer: Medicare Other | Admitting: Interventional Cardiology

## 2018-08-03 ENCOUNTER — Other Ambulatory Visit: Payer: Self-pay

## 2018-08-03 ENCOUNTER — Ambulatory Visit: Payer: Medicare Other | Admitting: Physician Assistant

## 2018-08-03 ENCOUNTER — Telehealth: Payer: Self-pay | Admitting: Interventional Cardiology

## 2018-08-03 ENCOUNTER — Encounter: Payer: Self-pay | Admitting: Interventional Cardiology

## 2018-08-03 DIAGNOSIS — I2510 Atherosclerotic heart disease of native coronary artery without angina pectoris: Secondary | ICD-10-CM

## 2018-08-03 DIAGNOSIS — I10 Essential (primary) hypertension: Secondary | ICD-10-CM | POA: Diagnosis not present

## 2018-08-03 DIAGNOSIS — I5032 Chronic diastolic (congestive) heart failure: Secondary | ICD-10-CM | POA: Diagnosis not present

## 2018-08-03 DIAGNOSIS — E782 Mixed hyperlipidemia: Secondary | ICD-10-CM

## 2018-08-03 DIAGNOSIS — I2583 Coronary atherosclerosis due to lipid rich plaque: Secondary | ICD-10-CM

## 2018-08-03 MED ORDER — FUROSEMIDE 20 MG PO TABS: ORAL_TABLET | ORAL | 3 refills | Status: DC

## 2018-08-03 MED ORDER — AMLODIPINE BESYLATE 10 MG PO TABS: 10.0000 mg | ORAL_TABLET | Freq: Every day | ORAL | 3 refills | Status: DC

## 2018-08-03 NOTE — Patient Instructions (Addendum)
Medication Instructions:  Your physician has recommended you make the following change in your medication:   1. DECREASE: furosemide (lasix) 20 mg tablet to 4 times a week  2. INCREASE: amlodipine (norvasc) to 10 mg daily  Lab work: None Ordered  If you have labs (blood work) drawn today and your tests are completely normal, you will receive your results only by: Marland Kitchen MyChart Message (if you have MyChart) OR . A paper copy in the mail If you have any lab test that is abnormal or we need to change your treatment, we will call you to review the results.  Testing/Procedures: None ordered  Follow-Up: . Follow up with Dr. Irish Lack via VIDEO Visit with [email or web address] on 08/17/18 at 3:00 PM  Any Other Special Instructions Will Be Listed Below (If Applicable).

## 2018-08-03 NOTE — Telephone Encounter (Signed)
Spoke to patient. She states that her phone was messed up for the visit and we will now proceed with the visit.

## 2018-08-03 NOTE — Telephone Encounter (Signed)
New message   Patient states that she did not get the call for her virtual visit today. The patient states that she can be reached at the cell phone # on file. Please contact the patient.

## 2018-08-03 NOTE — Telephone Encounter (Signed)
Follow Up:      Returning your call from today. 

## 2018-08-03 NOTE — Progress Notes (Signed)
Virtual Visit via Video Note   This visit type was conducted due to national recommendations for restrictions regarding the COVID-19 Pandemic (e.g. social distancing) in an effort to limit this patient's exposure and mitigate transmission in our community.  Due to her co-morbid illnesses, this patient is at least at moderate risk for complications without adequate follow up.  This format is felt to be most appropriate for this patient at this time.  All issues noted in this document were discussed and addressed.  A limited physical exam was performed with this format.  Please refer to the patient's chart for her consent to telehealth for [facility].   Evaluation Performed:  Follow-up visit  Date:  08/03/2018   ID:  [name], [name] 12/23/1945, MRN [id number]  Patient Location: Home Provider Location: Office  PCP:  Leighton Ruff, MD  Cardiologist:  Larae Grooms, MD  Electrophysiologist:  None   Chief Complaint:  CAD  History of Present Illness:    [name] is a 73 y.o. female with who has had CAD. She had PCI in 1999 and 2002. She had a cath without obstructive disease in 2015.  At prior visits, I stressed the importance of more exercise to her for her overall well-being.  She has had a chronic headache and is now seeing neuro.  Denies : Chest pain. Dizziness. Leg edema. Nitroglycerin use. Orthopnea. Palpitations. Paroxysmal nocturnal dyspnea. Shortness of breath. Syncope.  Some days, she does not feel well, but no specific cardiac sx.  She thinks it may be the Lasix.  She skips it on Tues/Frid.  On Monday and Thursday, she sometimes feels tired.    She had acute diastolic heart failure after a trip to [place] and [place].  She returned in March 2020.  She was tested for COVID and this was negative.  She was diuresed 20 lbs.  She may have eaten more salt.  Troponin was negative.  BNP was elevated.   She was discharged in Lasix 20 mg daily, discharged  from [facility] the end of March 2020.   Some high BP readings, up to the 160s.  She has some dizziness, particularly when changing positions. She feels that her hair is falling out.    Not exercising regularly.  The patient does not have symptoms concerning for COVID-19 infection (fever, chills, cough, or new shortness of breath).    Past Medical History:  Diagnosis Date  . Anxiety   . Arthritis   . Bone spur   . Coronary artery disease    stent - 1999  . Depression   . Fibrocystic breast changes   . GERD (gastroesophageal reflux disease)   . Glucosuria   . Hernia, inguinal, left 10/2002  . Hypertension   . Melanoma ([place]) 10/2008   right shoulder and arm  . Menorrhagia   . Myocardial infarction ([place]) 1999   Past Surgical History:  Procedure Laterality Date  . ANGIOPLASTY  2000  . APPENDECTOMY    . bone chip removed from left foot     . CHOLECYSTECTOMY    . COLONOSCOPY WITH PROPOFOL N/A 03/03/2017   Procedure: COLONOSCOPY WITH PROPOFOL;  Surgeon: Juanita Craver, MD;  [facility];  Service: Endoscopy;  Laterality: N/A;  . CORONARY ANGIOPLASTY WITH STENT PLACEMENT  10/99  . ESOPHAGOGASTRODUODENOSCOPY (EGD) WITH PROPOFOL N/A 03/03/2017   Procedure: ESOPHAGOGASTRODUODENOSCOPY (EGD) WITH PROPOFOL;  Surgeon: Juanita Craver, MD;  [facility];  Service: Endoscopy;  Laterality: N/A;  . HERNIA REPAIR  umbilical   . HYSTEROSCOPY  2/98   D&C (polyps)  . LEFT HEART CATHETERIZATION WITH CORONARY ANGIOGRAM N/A 01/25/2014   Procedure: LEFT HEART CATHETERIZATION WITH CORONARY ANGIOGRAM;  Surgeon: Jettie Booze, MD;  Location: [facility] CATH LAB;  Service: Cardiovascular;  Laterality: N/A;  . NODE DISSECTION     neg  . TOTAL ABDOMINAL HYSTERECTOMY     LSO     Failed TVH     Current Meds  Medication Sig  . acetaminophen (TYLENOL 8 HOUR ARTHRITIS PAIN) 650 MG CR tablet Take 1,300 mg every 8 (eight) hours as needed by mouth for pain.  Marland Kitchen amLODipine (NORVASC) 5 MG  tablet Take 5 mg by mouth daily.  Marland Kitchen atorvastatin (LIPITOR) 10 MG tablet Take 1 tablet (10 mg total) by mouth daily.  . cloNIDine (CATAPRES) 0.1 MG tablet Take 0.1 mg by mouth 2 (two) times daily.  Marland Kitchen ELIQUIS 5 MG TABS tablet Take 5 mg by mouth 2 (two) times daily.  . fluticasone (FLONASE) 50 MCG/ACT nasal spray Place 2 sprays into both nostrils daily as needed for allergies.  . furosemide (LASIX) 20 MG tablet Take 1 tablet (20 mg total) 5 days a week  . isosorbide mononitrate (IMDUR) 60 MG 24 hr tablet Take 1 tablet (60 mg total) by mouth daily. Please keep upcoming appointment for further refills  . labetalol (NORMODYNE) 200 MG tablet Take 200 mg by mouth 2 (two) times daily.  Marland Kitchen lisinopril (PRINIVIL,ZESTRIL) 20 MG tablet Take 1 tablet (20 mg total) by mouth daily.  . meclizine (ANTIVERT) 25 MG tablet Take 1 tablet (25 mg total) by mouth 3 (three) times daily as needed for dizziness.  . nitroGLYCERIN (NITROSTAT) 0.4 MG SL tablet DISSOLVE 1 TABLET UNDER THE TONGUE EVERY 5 MINUTES AS NEEDED FOR CHEST PAIN  . potassium chloride SA (K-DUR,KLOR-CON) 20 MEQ tablet Take 1 tablet (20 mEq total) by mouth daily.  Marland Kitchen PROAIR HFA 108 (90 Base) MCG/ACT inhaler Inhale 1 puff into the lungs every 4 (four) hours as needed for wheezing or shortness of breath. q4h  . Soft Lens Products (REWETTING DROPS) SOLN Place 1 drop 3 (three) times daily as needed into both eyes (for dry/irritated contact lenses).     Allergies:   Azithromycin; Other; Penicillins; Tetanus toxoids; Gadolinium derivatives; Morphine and related; and Augmentin [amoxicillin-pot clavulanate]   Social History   Tobacco Use  . Smoking status: Never Smoker  . Smokeless tobacco: Never Used  Substance Use Topics  . Alcohol use: Yes    Alcohol/week: 1.0 standard drinks    Types: 1 Standard drinks or equivalent per week    Comment: occ glass of wine  . Drug use: No    Frequency: 5.0 times per week     Family Hx: The patient's family history  includes Cancer in her father; Diabetes in her mother; Heart attack in her father; Heart disease in her father; Hypertension in her father and mother; Stroke in her mother.  ROS:   Please see the history of present illness.    Occasional faitgue; hair loss All other systems reviewed and are negative.   Prior CV studies:   The following studies were reviewed today:  NSR, no ST changes  Labs/Other Tests and Data Reviewed:    EKG:  An ECG dated 07/09/2018 was personally reviewed today and demonstrated:  NSR, no ST changes  Recent Labs: 07/09/2018: ALT 31; B Natriuretic Peptide 282.1; Hemoglobin 11.1; Platelets 179 07/10/2018: Magnesium 1.6 07/12/2018: BUN 9; Creatinine, Ser 1.04; Potassium 4.1;  Sodium 141   Recent Lipid Panel No results found for: CHOL, TRIG, HDL, CHOLHDL, LDLCALC, LDLDIRECT  Wt Readings from Last 3 Encounters:  08/03/18 202 lb (91.6 kg)  07/20/18 196 lb (88.9 kg)  07/11/18 214 lb 1.1 oz (97.1 kg)     Objective:    Vital Signs:  BP (!) 166/92   Pulse 65   Ht 5\' 3"  (1.6 m)   Wt 202 lb (91.6 kg)   BMI 35.78 kg/m    VITAL SIGNS:  reviewed GEN:  no acute distress RESPIRATORY:  normal respiratory effort, symmetric expansion exam limited due to video format  ASSESSMENT & PLAN:    1. CAD: No angina. COntinue secondary prevention.  2. Diastolic heart failure: Appears euvolemic.  She is leaking urine at times.  WIl decrease Lasix to 4x/week.  She has some mild orthostatic symptoms when standing. 3. Hyperlipidemia: LDL 79 in 10/19 4. HTN:  Increase amlopdine to 10 mg daily.  Elevated readings with systolics up into the 937D.  Blood pressure had been quite low after her diuresis but now appears to be coming back to a more normal range. 5. MOrbid obesity: Increase exercise.  Avoid salt in the diet. 6. AFib: No palpitations.  Eliquis for stroke prevention.  Was in sinus rhythm at last EKG in the emergency room. 7. Will check with Pharm.D. regarding potential  medications that could be causing her hair to fall out.  COVID-19 Education: The signs and symptoms of COVID-19 were discussed with the patient and how to seek care for testing (follow up with PCP or arrange E-visit).  The importance of social distancing was discussed today.  Time:   Today, I have spent 25 minutes with the patient with telehealth technology discussing the above problems.     Medication Adjustments/Labs and Tests Ordered: Current medicines are reviewed at length with the patient today.  Concerns regarding medicines are outlined above.   Tests Ordered: No orders of the defined types were placed in this encounter.   Medication Changes: No orders of the defined types were placed in this encounter.   Disposition:  Follow up in 2 week(s)  Signed, Larae Grooms, MD  08/03/2018 12:54 PM    [place]

## 2018-08-06 ENCOUNTER — Telehealth: Payer: Self-pay

## 2018-08-06 NOTE — Telephone Encounter (Signed)
Called and made patient aware. She states that she started labetolol in March. She states that her hair loss started about a month ago.

## 2018-08-06 NOTE — Telephone Encounter (Signed)
-----   Message from Jettie Booze, MD sent at 08/03/2018  3:34 PM EDT ----- Regarding: FW: hair loss Labetolol is most likely.  If this is not a new medicine, not sure that her hair loss is medicine related.   JV ----- Message ----- From: Leeroy Bock, [facility] Sent: 08/03/2018   2:17 PM EDT To: Jettie Booze, MD Subject: hair loss                                      Not sure if my response routed to you correctly, the only way I could find to respond in the message was by routing comments within your telehealth encounter. Let me know if you received my response that way, if not then I'll start sending separate staff messages. Her labetalol would be most likely to cause hair loss. Lisinopril and atorvastatin have case reports of alopecia, although this is not as commonly seen as with beta blockers.  Thanks, [employer]

## 2018-08-17 ENCOUNTER — Other Ambulatory Visit: Payer: Self-pay

## 2018-08-17 ENCOUNTER — Encounter: Payer: Self-pay | Admitting: Interventional Cardiology

## 2018-08-17 ENCOUNTER — Telehealth (INDEPENDENT_AMBULATORY_CARE_PROVIDER_SITE_OTHER): Payer: Medicare Other | Admitting: Interventional Cardiology

## 2018-08-17 DIAGNOSIS — I2583 Coronary atherosclerosis due to lipid rich plaque: Secondary | ICD-10-CM | POA: Diagnosis not present

## 2018-08-17 DIAGNOSIS — I10 Essential (primary) hypertension: Secondary | ICD-10-CM

## 2018-08-17 DIAGNOSIS — I5032 Chronic diastolic (congestive) heart failure: Secondary | ICD-10-CM | POA: Diagnosis not present

## 2018-08-17 DIAGNOSIS — I2510 Atherosclerotic heart disease of native coronary artery without angina pectoris: Secondary | ICD-10-CM

## 2018-08-17 NOTE — Patient Instructions (Signed)
Medication Instructions:  Your physician recommends that you continue on your current medications as directed. Please refer to the Current Medication list given to you today.  Take your amlodipine and lisinopril at night  If you need a refill on your cardiac medications before your next appointment, please call your pharmacy.   Lab work: None Ordered  If you have labs (blood work) drawn today and your tests are completely normal, you will receive your results only by: Marland Kitchen MyChart Message (if you have MyChart) OR . A paper copy in the mail If you have any lab test that is abnormal or we need to change your treatment, we will call you to review the results.  Testing/Procedures: None ordered  Follow-Up: . Call back in 1-2 weeks to report Blood Pressure Readings  Any Other Special Instructions Will Be Listed Below (If Applicable).

## 2018-08-17 NOTE — Telephone Encounter (Signed)
THis is what we will do if [name] continues to have hair loss.

## 2018-08-17 NOTE — Telephone Encounter (Signed)
I think it's worth trying carvedilol 12.5mg  BID instead of labetalol - the incidence of reported alopecia is < 1% with carvedilol. Would encourage pt to monitor for any improvement in symptoms as well as BP and HR.

## 2018-08-17 NOTE — Telephone Encounter (Signed)
Would carvredilol 12.5 BID be equivalent BP lowering agent and  Reduce risk of hair loss?

## 2018-08-17 NOTE — Progress Notes (Signed)
Virtual Visit via Video Note   This visit type was conducted due to national recommendations for restrictions regarding the COVID-19 Pandemic (e.g. social distancing) in an effort to limit this patient's exposure and mitigate transmission in our community.  Due to her co-morbid illnesses, this patient is at least at moderate risk for complications without adequate follow up.  This format is felt to be most appropriate for this patient at this time.  All issues noted in this document were discussed and addressed.  A limited physical exam was performed with this format.  Please refer to the patient's chart for her consent to telehealth for [facility].   Date:  08/17/2018   ID:  [name], DOB 1945/05/28, MRN [id number]  Patient Location: Home Provider Location: Office  PCP:  Leighton Ruff, MD  Cardiologist:  Larae Grooms, MD  Electrophysiologist:  None   Evaluation Performed:  Follow-Up Visit  Chief Complaint:  HTN  History of Present Illness:    [name] is a 73 y.o. female with who has had CAD. She had PCI in 1999 and 2002. She had a cath without obstructive disease in 2015.  At prior visits,I stressed the importance of more exercise to her for her overall well-being.  She has had a chronic headache and is now seeing neuro.  She had acute diastolic heart failure after a trip to [place] and [place]. She returned in March 2020. She was tested for COVID and this was negative.  She was diuresed 20 lbs. She may have eaten more salt. Troponin was negative. BNP was elevated.   She was discharged in Lasix 20 mg daily, discharged from [facility] the end of March 2020.   Some high BP readings, up to the 160s.  At last visit, plan was : Increase amlopdine to 10 mg daily.  Elevated readings with systolics up into the 989Q.  Blood pressure had been quite low after her diuresis but now appears to be coming back to a more normal range.  THere was a concern  about labetolol causing hair loss.   This AM, her BP was 119/71.  It had been higher during the week. Typically in the 150s.  She reports some GERD at night.  She has used famotidine in the past.    The patient does not have symptoms concerning for COVID-19 infection (fever, chills, cough, or new shortness of breath).    Past Medical History:  Diagnosis Date  . Anxiety   . Arthritis   . Bone spur   . Coronary artery disease    stent - 1999  . Depression   . Fibrocystic breast changes   . GERD (gastroesophageal reflux disease)   . Glucosuria   . Hernia, inguinal, left 10/2002  . Hypertension   . Melanoma ([place]) 10/2008   right shoulder and arm  . Menorrhagia   . Myocardial infarction ([place]) 1999   Past Surgical History:  Procedure Laterality Date  . ANGIOPLASTY  2000  . APPENDECTOMY    . bone chip removed from left foot     . CHOLECYSTECTOMY    . COLONOSCOPY WITH PROPOFOL N/A 03/03/2017   Procedure: COLONOSCOPY WITH PROPOFOL;  Surgeon: Juanita Craver, MD;  Location: [facility] ENDOSCOPY;  Service: Endoscopy;  Laterality: N/A;  . CORONARY ANGIOPLASTY WITH STENT PLACEMENT  10/99  . ESOPHAGOGASTRODUODENOSCOPY (EGD) WITH PROPOFOL N/A 03/03/2017   Procedure: ESOPHAGOGASTRODUODENOSCOPY (EGD) WITH PROPOFOL;  Surgeon: Juanita Craver, MD;  Location: [facility] ENDOSCOPY;  Service: Endoscopy;  Laterality: N/A;  .  HERNIA REPAIR     umbilical   . HYSTEROSCOPY  2/98   D&C (polyps)  . LEFT HEART CATHETERIZATION WITH CORONARY ANGIOGRAM N/A 01/25/2014   Procedure: LEFT HEART CATHETERIZATION WITH CORONARY ANGIOGRAM;  Surgeon: Jettie Booze, MD;  Location: [facility] CATH LAB;  Service: Cardiovascular;  Laterality: N/A;  . NODE DISSECTION     neg  . TOTAL ABDOMINAL HYSTERECTOMY     LSO     Failed TVH     Current Meds  Medication Sig  . acetaminophen (TYLENOL 8 HOUR ARTHRITIS PAIN) 650 MG CR tablet Take 1,300 mg every 8 (eight) hours as needed by mouth for pain.  Marland Kitchen amLODipine (NORVASC) 10 MG tablet Take 1  tablet (10 mg total) by mouth daily.  Marland Kitchen atorvastatin (LIPITOR) 10 MG tablet Take 1 tablet (10 mg total) by mouth daily.  . cloNIDine (CATAPRES) 0.1 MG tablet Take 0.1 mg by mouth 2 (two) times daily.  Marland Kitchen ELIQUIS 5 MG TABS tablet Take 5 mg by mouth 2 (two) times daily.  . fluticasone (FLONASE) 50 MCG/ACT nasal spray Place 2 sprays into both nostrils daily as needed for allergies.  . furosemide (LASIX) 20 MG tablet Take 1 tablet (20 mg total) 4 days a week  . isosorbide mononitrate (IMDUR) 60 MG 24 hr tablet Take 1 tablet (60 mg total) by mouth daily. Please keep upcoming appointment for further refills  . labetalol (NORMODYNE) 200 MG tablet Take 200 mg by mouth 2 (two) times daily.  Marland Kitchen lisinopril (PRINIVIL,ZESTRIL) 20 MG tablet Take 1 tablet (20 mg total) by mouth daily.  . nitroGLYCERIN (NITROSTAT) 0.4 MG SL tablet DISSOLVE 1 TABLET UNDER THE TONGUE EVERY 5 MINUTES AS NEEDED FOR CHEST PAIN  . potassium chloride SA (K-DUR,KLOR-CON) 20 MEQ tablet Take 1 tablet (20 mEq total) by mouth daily.  Marland Kitchen PROAIR HFA 108 (90 Base) MCG/ACT inhaler Inhale 1 puff into the lungs every 4 (four) hours as needed for wheezing or shortness of breath. q4h  . Soft Lens Products (REWETTING DROPS) SOLN Place 1 drop 3 (three) times daily as needed into both eyes (for dry/irritated contact lenses).     Allergies:   Azithromycin; Other; Penicillins; Tetanus toxoids; Gadolinium derivatives; Morphine and related; and Augmentin [amoxicillin-pot clavulanate]   Social History   Tobacco Use  . Smoking status: Never Smoker  . Smokeless tobacco: Never Used  Substance Use Topics  . Alcohol use: Yes    Alcohol/week: 1.0 standard drinks    Types: 1 Standard drinks or equivalent per week    Comment: occ glass of wine  . Drug use: No    Frequency: 5.0 times per week     Family Hx: The patient's family history includes Cancer in her father; Diabetes in her mother; Heart attack in her father; Heart disease in her father;  Hypertension in her father and mother; Stroke in her mother.  ROS:   Please see the history of present illness.    Dizziness improved with less frequency All other systems reviewed and are negative.   Prior CV studies:   The following studies were reviewed today:  Cr 1.04  Labs/Other Tests and Data Reviewed:    EKG:  No ECG reviewed.  Recent Labs: 07/09/2018: ALT 31; B Natriuretic Peptide 282.1; Hemoglobin 11.1; Platelets 179 07/10/2018: Magnesium 1.6 07/12/2018: BUN 9; Creatinine, Ser 1.04; Potassium 4.1; Sodium 141   Recent Lipid Panel No results found for: CHOL, TRIG, HDL, CHOLHDL, LDLCALC, LDLDIRECT  Wt Readings from Last 3 Encounters:  08/17/18 203  lb (92.1 kg)  08/03/18 202 lb (91.6 kg)  07/20/18 196 lb (88.9 kg)     Objective:    Vital Signs:  BP 119/71   Pulse 68   Ht 5\' 3"  (1.6 m)   Wt 203 lb (92.1 kg)   BMI 35.96 kg/m    VITAL SIGNS:  reviewed GEN:  no acute distress RESPIRATORY:  normal respiratory effort, symmetric expansion PSYCH:  normal affect exam limited by video format  ASSESSMENT & PLAN:    1. CAD: No angina.  Continue aggressive secondary prevention.   2. HTN: She will move amlodipine and lisinopril to evening.  She will call in with readings.  If too low in AM, move amlodipine back to AM. 3. Diastolic heart failure: Appears euvolemic.  Toleralting less frequent diuretic. 4. Anticoagulated: No bleeding issues.   5. Hair loss:  She is using Biotin.  If she does not note improvement, would change labetolol to carvedilol.    COVID-19 Education: The signs and symptoms of COVID-19 were discussed with the patient and how to seek care for testing (follow up with PCP or arrange E-visit).  The importance of social distancing was discussed today.  Time:   Today, I have spent 25 minutes with the patient with telehealth technology discussing the above problems.     Medication Adjustments/Labs and Tests Ordered: Current medicines are reviewed at  length with the patient today.  Concerns regarding medicines are outlined above.   Tests Ordered: No orders of the defined types were placed in this encounter.   Medication Changes: No orders of the defined types were placed in this encounter.   Disposition:  Follow up in 2 week(s)  Signed, Larae Grooms, MD  08/17/2018 2:48 PM    [place]

## 2018-08-19 ENCOUNTER — Other Ambulatory Visit: Payer: Self-pay | Admitting: Interventional Cardiology

## 2018-08-19 MED ORDER — ISOSORBIDE MONONITRATE ER 60 MG PO TB24: 60.0000 mg | ORAL_TABLET | Freq: Every day | ORAL | 3 refills | Status: DC

## 2018-08-31 ENCOUNTER — Telehealth: Payer: Self-pay | Admitting: Interventional Cardiology

## 2018-08-31 NOTE — Telephone Encounter (Signed)
° ° °  Pt c/o medication issue:  1. Name of Medication: lisinopril (PRINIVIL,ZESTRIL) 20 MG tablet   2. How are you currently taking this medication (dosage and times per day)? Clarification needed  3. Are you having a reaction (difficulty breathing--STAT)? no  4. What is your medication issue? Dosage clarification needed, please call [facility], use ref # [id number]

## 2018-08-31 NOTE — Telephone Encounter (Signed)
Attempted to return call to [facility] but kept getting transferred and was disconnected. Will try again later.

## 2018-09-17 ENCOUNTER — Telehealth: Payer: Self-pay | Admitting: Interventional Cardiology

## 2018-09-17 NOTE — Telephone Encounter (Signed)
Late entry:  Returned call to patient. Patient asking if she can use voltaren gel for her arthritis. Made patient aware that as long as it was just the gel applied externally she should be fine.   Patient also reporting her BP readings:  Morning readings: 144/84, 145/85, 148/78, 138/79, 147/82  Evening readings: 152/85, 136/82, 170/69, 166/82, 159/82   Patient states that she has been taking lisinopril in the AM and the rest of her BP meds in the PM. Patient denies having any Sx.

## 2018-09-17 NOTE — Telephone Encounter (Signed)
New Message   Pt c/o medication issue:  1. Name of Medication: volteran   2. How are you currently taking this medication (dosage and times per day)?   3. Are you having a reaction (difficulty breathing--STAT)?   4. What is your medication issue? Patient is calling because she would like to start using this medication for her arthritis. Please advise.    Pt c/o BP issue:  1. What are your last 5 BP readings? 144/84, 145/85, 148/78, 138/79, 147/82 (morning numbers)  152/85, 136/82, 170/69, 166/82, 159/82 (evening numbers) 2. Are you having any other symptoms (ex. Dizziness, headache, blurred vision, passed out)? No symptoms 3. What is your medication issue? No   Patient is concern about her BP readings. She takes her BP twice a day. The numbers above reflect mornings and evening.

## 2018-09-18 ENCOUNTER — Encounter

## 2018-09-20 NOTE — Telephone Encounter (Signed)
Would increase clonidine to 0.1 mg TID

## 2018-09-20 NOTE — Telephone Encounter (Signed)
Attempted to contact patient but there was no answer and VM did not pick up. Will try again at another time. 

## 2018-09-21 ENCOUNTER — Other Ambulatory Visit: Payer: Self-pay | Admitting: Interventional Cardiology

## 2018-09-21 MED ORDER — LISINOPRIL 40 MG PO TABS: 40.0000 mg | ORAL_TABLET | Freq: Every day | ORAL | 3 refills | Status: DC

## 2018-09-21 MED ORDER — CLONIDINE HCL 0.1 MG PO TABS: 0.1000 mg | ORAL_TABLET | Freq: Three times a day (TID) | ORAL | 3 refills | Status: DC

## 2018-09-21 NOTE — Telephone Encounter (Signed)
Pt's medication was sent to pt's pharmacy as requested. Confirmation received.  °

## 2018-09-21 NOTE — Telephone Encounter (Signed)
Called and made patient aware of Dr. Hassell Done recommendations to increase clonidine to 0.1mg  TID. Patient will continue to monitor BP and call back to report readings in a couple of weeks. New Rx sent to preferred pharmacy.

## 2018-09-21 NOTE — Telephone Encounter (Signed)
°*  STAT* If patient is at the pharmacy, call can be transferred to refill team.   1. Which medications need to be refilled? (please list name of each medication and dose if known) lisinopril (ZESTRIL) 40 MG tablet  2. Which pharmacy/location (including street and city if local pharmacy) is medication to be sent to? [place], [place]  3. Do they need a 30 day or 90 day supply? 90 days

## 2018-09-23 ENCOUNTER — Telehealth: Payer: Self-pay | Admitting: Neurology

## 2018-09-23 NOTE — Telephone Encounter (Signed)
Due to current COVID 19 pandemic, our office is severely reducing in office visits until further notice, in order to minimize the risk to our patients and healthcare providers.   Called patient to offer a virtual visit for 6/16 appointment. I spoke with husband who confirmed a virtual visit for patient. I have sent pt an e-mail with link and directions, as well as my contact info for reference. They are expecting 2 calls from our office prior to appt to update chart and to check in.  Pt understands that although there may be some limitations with this type of visit, we will take all precautions to reduce any security or privacy concerns.  Pt understands that this will be treated like an in office visit and we will file with pt's insurance, and there may be a patient responsible charge related to this service.

## 2018-09-27 ENCOUNTER — Encounter: Payer: Self-pay | Admitting: Neurology

## 2018-09-27 NOTE — Telephone Encounter (Signed)
I called pt. Pt's meds, allergies, and PMH were updated.  Pt did receive the link for her virtual visit and understands that process.   Pt reports that her cpap is going well.

## 2018-09-28 ENCOUNTER — Other Ambulatory Visit: Payer: Self-pay

## 2018-09-28 ENCOUNTER — Encounter: Payer: Self-pay | Admitting: Neurology

## 2018-09-28 ENCOUNTER — Ambulatory Visit (INDEPENDENT_AMBULATORY_CARE_PROVIDER_SITE_OTHER): Payer: Medicare Other | Admitting: Neurology

## 2018-09-28 DIAGNOSIS — Z9989 Dependence on other enabling machines and devices: Secondary | ICD-10-CM | POA: Diagnosis not present

## 2018-09-28 DIAGNOSIS — G4733 Obstructive sleep apnea (adult) (pediatric): Secondary | ICD-10-CM

## 2018-09-28 NOTE — Patient Instructions (Signed)
Given verbally, during today's virtual video-based encounter, with verbal feedback received.   

## 2018-09-28 NOTE — Progress Notes (Signed)
Interim history:  [name] is a 73 year old right-handed woman with an underlying medical history of hypertension, prediabetes, vitamin D deficiency, coronary artery disease with status post stenting, anxiety, depression, hyperlipidemia, and obesity, who presents for a virtual, video based appointment via doxy.me for follow-up consultation of her recurrent headaches and sleep apnea.  The patient is unaccompanied today and joins from home, I am located in my office.  I last saw her on 03/29/2018, at which time we talked about her brain MRI as well as neck MRI.  She had degenerative findings in her neck MRI.  She had white matter changes in the moderate range on the brain MRI.  Her sleep study showed severe sleep apnea.  She was compliant with CPAP at her last visit.  She was supposed to see Dr. Maryjean Ka for her ongoing neck pain soon. She was using a soft neck collar at night.    Today, 09/28/2018: Please also see below for virtual visit documentation.  I reviewed her CPAP compliance data from 08/29/2018 through 09/27/2018 which is a total of 30 days, during which time she used her machine every night with percent used days greater than 4 hours at 100%, indicating superb compliance with an average usage of 9 hours, residual AHI at goal at 3.8/h, leak on the high side with a 95th percentile at 41.4 L/min on a pressure of 11 cm with EPR of 3.    The patient's allergies, current medications, family history, past medical history, past social history, past surgical history and problem list were reviewed and updated as appropriate.    Previously:     03/29/2018: She had a brain MRI with and without contrast and cervical spine MRI without contrast on 01/09/2018 and I reviewed the results: IMPRESSION: This MRI of the brain with and without contrast shows the following: 1.     T2/FLAIR hyperintense foci in the hemispheres, pons and right cerebellar hemisphere most consistent with moderately advanced chronic  microvascular ischemic changes. 2.     There is a normal enhancement pattern and there are no acute findings.   IMPRESSION: This MRI of the cervical spine without contrast shows the following: 1.    The spinal cord appears normal. 2.    Mild multilevel degenerative changes as detailed above.    The worst levels are C4-C5.  There is moderate left foraminal narrowing and at C5-C6 where there is borderline spinal stenosis and mild to moderate foraminal narrowing.    There does not appear to be nerve root compression. 3.    White matter changes in the pons consistent with chronic microvascular ischemic change.  This is evaluated further on the MRI of the brain also performed today. 4.    There are no acute findings.   She had a split-night sleep study on 01/15/2018: Baseline sleep efficiency was 67.4%, sleep latency 21 minutes, REM sleep was absent prior to starting CPAP. She had a total AHI of 58.5 per hour, supine AHI was 104.8 per hour. Average oxygen saturation was 94%, nadir in non-REM sleep was 81%. She had no significant PLMS. She was fitted with a nasal interface and CPAP was titrated from 5 cm to 10 cm. On the final pressure her AHI was 0 per hour but only supine non-REM sleep was achieved and O2 nadir briefly at 87%. She did achieve a significant percentage of REM sleep during the second part of the study at 27.3%. Based on her test results I suggested a home CPAP treatment  pressure of 11 cm.   I reviewed her CPAP compliance data from 02/23/2018 through 03/24/2018 which is a total of 30 days, during which time she used her CPAP every night with percent used days greater than 4 hours at 100%, indicating superb compliance with an average usage of 8 hours, residual AHI at goal at 2.8 per hour, leak on the higher end with the 95th percentile at 23.4 L/m on a pressure of 11 cm with EPR of 3.    12/31/2017: (She) reports recent onset of headaches in January 2019. Headaches came on gradually and are  located in the right posterior area. She had years ago a sebaceous cyst removed which was rather large and in the same area. She feels like the headache is in the scar area. The scar is not actually bothering her as such. She cannot pinpoint a very small area, it is a achy headache. She has no associated photophobia or nausea or vomiting, its typically not throbbing but more constant. She has had neck pain on the right side without any significant radiation. She has occasional left arm tingling but no radiating pain on the left. She has otherwise no one-sided weakness or numbness, no facial symptoms nor slurring of speech. She has no headache history in the past. She has never had an MRI. Of note, she did get a single coronary stent placed in October 1999. She does not report any significant snoring but does have sleep disturbance particularly difficulty maintaining sleep. She is in bed around 1 AM typically, falls asleep okay but wakes up around 5. She has nocturia about once per average night. She has difficulty maintaining sleep after that. She often lays in bed and falls asleep around 8 AM and sleeps till noon typically. She also takes an afternoon nap of about 30-45 minutes. Sometimes she sleeps longer than that. She has noticed clicking sensation or sounds in her neck. She feels that she has reduced neck mobility. She is retired and lives with her husband, they have 2 children. She is a nonsmoker and drinks alcohol very occasionally, soda about 1-2 servings per day on average. She reports right ear fullness. She was told that she had mild inflammation type changes in her ear canal, no ringing in the ear reported. I reviewed your office records, which you kindly included. You ordered a head CT. She had a head CT without contrast on 10/22/2017 and I reviewed the results: Impression: Normal head CT. She has never had a sleep study but her cardiologist recommended that she pursue a sleep study.for her headaches  she has not really tried anything, other than taking her typical dose of Tylenol which she takes daily for arthritis.  Her Past Medical History Is Significant For: Past Medical History:  Diagnosis Date   Anxiety    Arthritis    Bone spur    Coronary artery disease    stent - 1999   Depression    Fibrocystic breast changes    GERD (gastroesophageal reflux disease)    Glucosuria    Hernia, inguinal, left 10/2002   Hypertension    Melanoma ([place]) 10/2008   right shoulder and arm   Menorrhagia    Myocardial infarction [facility]) 1999    Her Past Surgical History Is Significant For: Past Surgical History:  Procedure Laterality Date   ANGIOPLASTY  2000   APPENDECTOMY     bone chip removed from left foot      CHOLECYSTECTOMY     COLONOSCOPY WITH  PROPOFOL N/A 03/03/2017   Procedure: COLONOSCOPY WITH PROPOFOL;  Surgeon: Juanita Craver, MD;  Location: [facility] ENDOS[facility];  Service: Endoscopy;  Laterality: N/A;   CORONARY ANGIOPLASTY WITH STENT PLACEMENT  10/99   ESOPHAGOGASTRODUODENOSCOPY (EGD) WITH PROPOFOL N/A 03/03/2017   Procedure: ESOPHAGOGASTRODUODENOSCOPY (EGD) WITH PROPOFOL;  Surgeon: Juanita Craver, MD;  Location: [facility] ENDOS[facility];  Service: Endoscopy;  Laterality: N/A;   HERNIA REPAIR     umbilical    HYSTEROSCOPY  2/98   D&C (polyps)   LEFT HEART CATHETERIZATION WITH CORONARY ANGIOGRAM N/A 01/25/2014   Procedure: LEFT HEART CATHETERIZATION WITH CORONARY ANGIOGRAM;  Surgeon: Jettie Booze, MD;  Location: [facility] CATH LAB;  Service: Cardiovascular;  Laterality: N/A;   NODE DISSECTION     neg   TOTAL ABDOMINAL HYSTERECTOMY     LSO     Failed TVH    Her Family History Is Significant For: Family History  Problem Relation Age of Onset   Diabetes Mother    Stroke Mother    Hypertension Mother    Heart disease Father    Hypertension Father    Cancer Father        pancreatic cancer   Heart attack Father     Her Social History Is Significant For: Social  History   Socioeconomic History   Marital status: Married    Spouse name: Not on file   Number of children: Not on file   Years of education: Not on file   Highest education level: Not on file  Occupational History   Not on file  Social Needs   Financial resource strain: Not on file   Food insecurity    Worry: Not on file    Inability: Not on file   Transportation needs    Medical: Not on file    Non-medical: Not on file  Tobacco Use   Smoking status: Never Smoker   Smokeless tobacco: Never Used  Substance and Sexual Activity   Alcohol use: Yes    Alcohol/week: 1.0 standard drinks    Types: 1 Standard drinks or equivalent per week    Comment: occ glass of wine   Drug use: No    Frequency: 5.0 times per week   Sexual activity: Yes    Partners: Male    Birth control/protection: Post-menopausal, Surgical    Comment: TAH/LSO  Lifestyle   Physical activity    Days per week: Not on file    Minutes per session: Not on file   Stress: Not on file  Relationships   Social connections    Talks on phone: Not on file    Gets together: Not on file    Attends religious service: Not on file    Active member of club or organization: Not on file    Attends meetings of clubs or organizations: Not on file    Relationship status: Not on file  Other Topics Concern   Not on file  Social History Narrative   Not on file    Her Allergies Are:  Allergies  Allergen Reactions   Azithromycin Swelling   Other Itching, Swelling, Other (See Comments) and Cough    Horse products   Penicillins Swelling    Has patient had a PCN reaction causing immediate rash, facial/tongue/throat swelling, SOB or lightheadedness with hypotension: Yes Has patient had a PCN reaction causing severe rash involving mucus membranes or skin necrosis: No Has patient had a PCN reaction that required hospitalization: No Has patient had a PCN reaction occurring within the  last 10 years: No If all  of the above answers are "NO", then may proceed with Cephalosporin use.    Tetanus Toxoids Swelling   Gadolinium Derivatives Hives and Itching    Pt stated that her left arm was itching and I noticed that she had two hives on her chest. No difficulty breathing, sneezing. One 25mg  of benadryl was ordered by Dr. Jimmye Norman. Pt remained at facility and was monitored before going home. -ldrake   Morphine And Related Nausea And Vomiting   Augmentin [Amoxicillin-Pot Clavulanate] Rash    Has patient had a PCN reaction causing immediate rash, facial/tongue/throat swelling, SOB or lightheadedness with hypotension: Yes Has patient had a PCN reaction causing severe rash involving mucus membranes or skin necrosis: Yes Has patient had a PCN reaction that required hospitalization:No Has patient had a PCN reaction occurring within the last 10 years: No If all of the above answers are "NO", then may proceed with Cephalosporin use.   :   Her Current Medications Are:  Outpatient Encounter Medications as of 09/28/2018  Medication Sig   acetaminophen (TYLENOL 8 HOUR ARTHRITIS PAIN) 650 MG CR tablet Take 1,300 mg every 8 (eight) hours as needed by mouth for pain.   amLODipine (NORVASC) 10 MG tablet Take 1 tablet (10 mg total) by mouth daily.   atorvastatin (LIPITOR) 10 MG tablet Take 1 tablet (10 mg total) by mouth daily.   cloNIDine (CATAPRES) 0.1 MG tablet Take 1 tablet (0.1 mg total) by mouth 3 (three) times daily.   ELIQUIS 5 MG TABS tablet Take 5 mg by mouth 2 (two) times daily.   fluticasone (FLONASE) 50 MCG/ACT nasal spray Place 2 sprays into both nostrils daily as needed for allergies.   furosemide (LASIX) 20 MG tablet Take 1 tablet (20 mg total) 4 days a week   isosorbide mononitrate (IMDUR) 60 MG 24 hr tablet Take 1 tablet (60 mg total) by mouth daily.   labetalol (NORMODYNE) 200 MG tablet Take 200 mg by mouth 2 (two) times daily.   lisinopril (ZESTRIL) 40 MG tablet Take 1 tablet (40 mg  total) by mouth daily.   nitroGLYCERIN (NITROSTAT) 0.4 MG SL tablet DISSOLVE 1 TABLET UNDER THE TONGUE EVERY 5 MINUTES AS NEEDED FOR CHEST PAIN   potassium chloride SA (K-DUR,KLOR-CON) 20 MEQ tablet Take 1 tablet (20 mEq total) by mouth daily.   PROAIR HFA 108 (90 Base) MCG/ACT inhaler Inhale 1 puff into the lungs every 4 (four) hours as needed for wheezing or shortness of breath. q4h   Soft Lens Products (REWETTING DROPS) SOLN Place 1 drop 3 (three) times daily as needed into both eyes (for dry/irritated contact lenses).   No facility-administered encounter medications on file as of 09/28/2018.   :  Review of Systems:  Out of a complete 14 point review of systems, all are reviewed and negative with the exception of these symptoms as listed below:  Virtual Visit via Video Note on @ TODAY@  I connected with@ on 09/28/18 at  2:00 PM EDT by a video enabled telemedicine application and verified that I am speaking with the correct person using two identifiers.   I discussed the limitations of evaluation and management by telemedicine and the availability of in person appointments. The patient expressed understanding and agreed to proceed.  History of Present Illness: She reports doing well with her CPAP, she feels that it is very helpful.  Her headaches have improved.  Her neck pain improved considerably after she had a neck injection under  Dr. Maryjean Ka, she had great relief for about 2 months.  She was told before hand that the injection would not last for a long time.  She has in the interim been started on clonidine recently for elevated blood pressure values through her cardiologist and is logging her blood pressure daily.  This morning it was 138/78 which is much improved but generally is she still has numbers in the 150s over 80s.  She has a virtual follow-up appointment with cardiology next week.  She has also started using Voltaren topical which she feels has helped her knee pain and neck  pain considerably. She was hospitalized when she was in [place] due to pneumonia.  They usually spend their winter in [place] and come back in April but because of the coronavirus pandemic they came back sooner.  She was then hospitalized in March for exacerbation of congestive heart failure.  She was also tested for the coronavirus but was negative.  She intends to go to the [facility] for a free coronavirus test tomorrow.   Observations/Objective: On examination she is pleasant and conversant, in no acute distress.  Comprehension and language skills are very good, speech is clear without dysarthria, hypophonia or voice tremor noted.  Face is symmetric with normal facial animation noted, extraocular movements well preserved in all directions.  Hearing is grossly intact, shoulder height is fairly equal.  Upper body mobility and coordination are grossly intact.  Assessment and Plan: In summary,Iowa F Studleyis a very pleasant 34 year oldfemalewith an underlying medical history of hypertension, prediabetes, vitamin D deficiency, coronary artery disease with status post stenting, anxiety, depression, hyperlipidemia, and obesity, whopresents for a virtual, video based appointment via doxy.me for follow-up consultation of her recurrent headaches and severe sleep apnea. She has been compliant with CPAP. Her sleep study in October 2019 showed severe sleep apnea. She responded well to CPAP therapy. She is fully compliant with treatment and commended for this. She noticed an improvement in her sleep consolidation and sleep quality. Her headaches have improved, particularly after she received a neck injection under Dr. Maryjean Ka. She had degenerative changes on her neck MRI.  Her brain MRI showed microvascular changes in the moderate range.She has found Voltaren topical gel also effective in treating her knee pain and neck pain.  She is at this juncture stable, she is doing well, she is mindful of the replacement  of her nasal pillows which she does monthly and she also changes the filters monthly.  She is advised to follow-up in 1 year routinely. I answered all her questions today and she was in agreement.  Follow Up Instructions:    I discussed the assessment and treatment plan with the patient. The patient was provided an opportunity to ask questions and all were answered. The patient agreed with the plan and demonstrated an understanding of the instructions.   The patient was advised to call back or seek an in-person evaluation if the symptoms worsen or if the condition fails to improve as anticipated.  I provided 15 minutes of non-face-to-face time during this encounter.   Star Age, MD

## 2018-09-30 ENCOUNTER — Telehealth: Payer: Self-pay

## 2018-09-30 NOTE — Telephone Encounter (Signed)
I called pt, scheduled her yearly f/u with MM. Pt verbalized understanding of new appt date and time.

## 2018-10-05 ENCOUNTER — Telehealth: Payer: Self-pay | Admitting: Interventional Cardiology

## 2018-10-05 NOTE — Telephone Encounter (Signed)
New Message   Patient was asked to record her last five blood pressure readings by Dr. Irish Lack due to switch in medication. The readings are as followed:  Morning 149/81 147/77, 143/77, 120/80, 120/66, 136/79  Evening 151/88 143/75 115/71 148/78 144/78  Night 115/71 with an irregular heartbeat alert. Please advise.      Do not have access to .dot phrases.

## 2018-10-06 NOTE — Telephone Encounter (Signed)
Left message for patient to call back  

## 2018-10-06 NOTE — Telephone Encounter (Signed)
Follow up ° ° °Patient is returning your call. Please call. ° ° ° °

## 2018-10-08 NOTE — Telephone Encounter (Signed)
Follow up ° ° °Patient is returning call per the previous message. Please call. °

## 2018-10-08 NOTE — Telephone Encounter (Signed)
I spoke to the patient who was calling to f/u on her BP fluctuation.  She feels great, but was just wondering if Dr Irish Lack had any recommendations.    I informed her that he and his nurse will be in the office Monday and would give her a call in the afternoon (she'd prefer, not morning person).  She was thankful for the call.

## 2018-10-11 NOTE — Telephone Encounter (Signed)
Called and spoke to patient. Reviewed BP readings below with patient. Instructed for patient to continue current meds. Patient states that she had one BP on night that stated she had an irregular rhythm but normal since. She denies chest pain, SOB, palpitations, skipped beats, or any other Sx. Patient will continue to monitor and let us know if she develops any Sx.

## 2018-11-01 ENCOUNTER — Ambulatory Visit (INDEPENDENT_AMBULATORY_CARE_PROVIDER_SITE_OTHER): Payer: Medicare Other | Admitting: Podiatry

## 2018-11-01 ENCOUNTER — Encounter

## 2018-11-01 ENCOUNTER — Encounter: Payer: Self-pay | Admitting: Podiatry

## 2018-11-01 ENCOUNTER — Ambulatory Visit (INDEPENDENT_AMBULATORY_CARE_PROVIDER_SITE_OTHER): Payer: Medicare Other

## 2018-11-01 ENCOUNTER — Other Ambulatory Visit: Payer: Self-pay

## 2018-11-01 DIAGNOSIS — M19071 Primary osteoarthritis, right ankle and foot: Secondary | ICD-10-CM

## 2018-11-01 DIAGNOSIS — M21621 Bunionette of right foot: Secondary | ICD-10-CM | POA: Diagnosis not present

## 2018-11-01 DIAGNOSIS — M79674 Pain in right toe(s): Secondary | ICD-10-CM | POA: Diagnosis not present

## 2018-11-01 DIAGNOSIS — B351 Tinea unguium: Secondary | ICD-10-CM | POA: Diagnosis not present

## 2018-11-01 DIAGNOSIS — M779 Enthesopathy, unspecified: Secondary | ICD-10-CM | POA: Diagnosis not present

## 2018-11-01 DIAGNOSIS — M778 Other enthesopathies, not elsewhere classified: Secondary | ICD-10-CM

## 2018-11-01 DIAGNOSIS — M19072 Primary osteoarthritis, left ankle and foot: Secondary | ICD-10-CM

## 2018-11-01 DIAGNOSIS — M79675 Pain in left toe(s): Secondary | ICD-10-CM | POA: Diagnosis not present

## 2018-11-01 NOTE — Patient Instructions (Addendum)
[employer] 600 Series athletic shoes  Arthritis Arthritis is a term that is commonly used to refer to joint pain or joint disease. There are more than 100 types of arthritis. What are the causes? The most common cause of this condition is wear and tear of a joint. Other causes include:  Gout.  Inflammation of a joint.  An infection of a joint.  Sprains and other injuries near the joint.  A reaction to medicines or drugs, or an allergic reaction. In some cases, the cause may not be known. What are the signs or symptoms? The main symptom of this condition is pain in the joint during movement. Other symptoms include:  Redness, swelling, or stiffness at a joint.  Warmth coming from the joint.  Fever.  Overall feeling of illness. How is this diagnosed? This condition may be diagnosed with a physical exam and tests, including:  Blood tests.  Urine tests.  Imaging tests, such as X-rays, an MRI, or a CT scan. Sometimes, fluid is removed from a joint for testing. How is this treated? This condition may be treated with:  Treatment of the cause, if it is known.  Rest.  Raising (elevating) the joint.  Applying cold or hot packs to the joint.  Medicines to improve symptoms and reduce inflammation.  Injections of a steroid such as cortisone into the joint to help reduce pain and inflammation. Depending on the cause of your arthritis, you may need to make lifestyle changes to reduce stress on your joint. Changes may include:  Exercising more.  Losing weight. Follow these instructions at home: Medicines  Take over-the-counter and prescription medicines only as told by your health care provider.  Do not take aspirin to relieve pain if your health care provider thinks that gout may be causing your pain. Activity  Rest your joint if told by your health care provider. Rest is important when your disease is active and your joint feels painful, swollen, or stiff.  Avoid  activities that make the pain worse. It is important to balance activity with rest.  Exercise your joint regularly with range-of-motion exercises as told by your health care provider. Try doing low-impact exercise, such as: ? Swimming. ? Water aerobics. ? Biking. ? Walking. Managing pain, stiffness, and swelling      If directed, put ice on the joint. ? Put ice in a plastic bag. ? Place a towel between your skin and the bag. ? Leave the ice on for 20 minutes, 2-3 times per day.  If your joint is swollen, raise (elevate) it above the level of your heart if directed by your health care provider.  If your joint feels stiff in the morning, try taking a warm shower.  If directed, apply heat to the affected area as often as told by your health care provider. Use the heat source that your health care provider recommends, such as a moist heat pack or a heating pad. If you have diabetes, do not apply heat without permission from your health care provider. To apply heat: ? Place a towel between your skin and the heat source. ? Leave the heat on for 20-30 minutes. ? Remove the heat if your skin turns bright red. This is especially important if you are unable to feel pain, heat, or cold. You may have a greater risk of getting burned. General instructions  Do not use any products that contain nicotine or tobacco, such as cigarettes, e-cigarettes, and chewing tobacco. If you need help quitting,  ask your health care provider.  Keep all follow-up visits as told by your health care provider. This is important. Contact a health care provider if:  The pain gets worse.  You have a fever. Get help right away if:  You develop severe joint pain, swelling, or redness.  Many joints become painful and swollen.  You develop severe back pain.  You develop severe weakness in your leg.  You cannot control your bladder or bowels. Summary  Arthritis is a term that is commonly used to refer to joint  pain or joint disease. There are more than 100 types of arthritis.  The most common cause of this condition is wear and tear of a joint. Other causes include gout, inflammation or infection of the joint, sprains, or allergies.  Symptoms of this condition include redness, swelling, or stiffness of the joint. Other symptoms include warmth, fever, or feeling ill.  This condition is treated with rest, elevation, medicines, and applying cold or hot packs.  Follow your health care provider's instructions about medicines, activity, exercises, and other home care treatments. This information is not intended to replace advice given to you by your health care provider. Make sure you discuss any questions you have with your health care provider. Document Released: 05/08/2004 Document Revised: 03/08/2018 Document Reviewed: 03/08/2018 Elsevier Patient Education  [place]? An infection that lies within the keratin of your nail plate that is caused by a fungus.  WHY ME? Fungal infections affect all ages, sexes, races, and creeds.  There may be many factors that predispose you to a fungal infection such as age, coexisting medical conditions such as diabetes, or an autoimmune disease; stress, medications, fatigue, genetics, etc.  Bottom line: fungus thrives in a warm, moist environment and your shoes offer such a location.  IS IT CONTAGIOUS? Theoretically, yes.  You do not want to share shoes, nail clippers or files with someone who has fungal toenails.  Walking around barefoot in the same room or sleeping in the same bed is unlikely to transfer the organism.  It is important to realize, however, that fungus can spread easily from one nail to the next on the same foot.  HOW DO WE TREAT THIS?  There are several ways to treat this condition.  Treatment may depend on many factors such as age, medications, pregnancy, liver and kidney conditions, etc.  It is  best to ask your doctor which options are available to you.  1. No treatment.   Unlike many other medical concerns, you can live with this condition.  However for many people this can be a painful condition and may lead to ingrown toenails or a bacterial infection.  It is recommended that you keep the nails cut short to help reduce the amount of fungal nail. 2. Topical treatment.  These range from herbal remedies to prescription strength nail lacquers.  About 40-50% effective, topicals require twice daily application for approximately 9 to 12 months or until an entirely new nail has grown out.  The most effective topicals are medical grade medications available through physicians offices. 3. Oral antifungal medications.  With an 80-90% cure rate, the most common oral medication requires 3 to 4 months of therapy and stays in your system for a year as the new nail grows out.  Oral antifungal medications do require blood work to make sure it is a safe drug for you.  A liver function panel will be performed prior  to starting the medication and after the first month of treatment.  It is important to have the blood work performed to avoid any harmful side effects.  In general, this medication safe but blood work is required. 4. Laser Therapy.  This treatment is performed by applying a specialized laser to the affected nail plate.  This therapy is noninvasive, fast, and non-painful.  It is not covered by insurance and is therefore, out of pocket.  The results have been very good with a 80-95% cure rate.  [facility] is the only practice in the area to offer this therapy. 5. Permanent Nail Avulsion.  Removing the entire nail so that a new nail will not grow back.

## 2018-11-07 NOTE — Progress Notes (Signed)
Subjective: [name] presents today referred by Leighton Ruff, MD with cc of painful, discolored, thick toenails which interfere with daily activities.  Pain is aggravated when wearing enclosed shoe gear.   She relates she has gotten pedicures in the past.  She also would like to discuss medication for her thick discolored toenails.  Patient states she had pneumonia in January and has twice tested negative for COVID.  Past Medical History:  Diagnosis Date  . Anxiety   . Arthritis   . Bone spur   . Coronary artery disease    stent - 1999  . Depression   . Fibrocystic breast changes   . GERD (gastroesophageal reflux disease)   . Glucosuria   . Hernia, inguinal, left 10/2002  . Hypertension   . Melanoma ([place]) 10/2008   right shoulder and arm  . Menorrhagia   . Myocardial infarction [facility]) 1999     Patient Active Problem List   Diagnosis Date Noted  . Acute respiratory disease due to COVID-19 virus 07/09/2018  . Coronary artery disease 07/09/2018  . Depression 07/09/2018  . Acute diastolic CHF (congestive heart failure) ([place]) 07/09/2018  . Suspected Covid-19 Virus Infection 07/09/2018  . Heart failure ([place]) 07/09/2018  . Pain in both feet 10/07/2016  . Daytime somnolence 08/30/2014  . Coronary atherosclerosis of native coronary artery 10/17/2013  . Mixed hyperlipidemia 10/17/2013  . Essential hypertension, benign 10/17/2013     Past Surgical History:  Procedure Laterality Date  . ANGIOPLASTY  2000  . APPENDECTOMY    . bone chip removed from left foot     . CHOLECYSTECTOMY    . COLONOSCOPY WITH PROPOFOL N/A 03/03/2017   Procedure: COLONOSCOPY WITH PROPOFOL;  Surgeon: Juanita Craver, MD;  Location: [facility] ENDOSCOPY;  Service: Endoscopy;  Laterality: N/A;  . CORONARY ANGIOPLASTY WITH STENT PLACEMENT  10/99  . ESOPHAGOGASTRODUODENOSCOPY (EGD) WITH PROPOFOL N/A 03/03/2017   Procedure: ESOPHAGOGASTRODUODENOSCOPY (EGD) WITH PROPOFOL;  Surgeon: Juanita Craver, MD;  Location: [facility]  ENDOSCOPY;  Service: Endoscopy;  Laterality: N/A;  . HERNIA REPAIR     umbilical   . HYSTEROSCOPY  2/98   D&C (polyps)  . LEFT HEART CATHETERIZATION WITH CORONARY ANGIOGRAM N/A 01/25/2014   Procedure: LEFT HEART CATHETERIZATION WITH CORONARY ANGIOGRAM;  Surgeon: Jettie Booze, MD;  Location: [facility] CATH LAB;  Service: Cardiovascular;  Laterality: N/A;  . NODE DISSECTION     neg  . TOTAL ABDOMINAL HYSTERECTOMY     LSO     Failed TVH    Medications: 5-HT3 Receptor Antagonists ondansetron (ZOFRAN) 4 MG tablet    ACE Inhibitors lisinopril (ZESTRIL) 40 MG tablet 40 mg, Daily    Alpha-Beta Blockers labetalol (NORMODYNE) 100 MG tablet    labetalol (NORMODYNE) 200 MG tablet 200 mg, 2 times daily    Analgesics Other acetaminophen (TYLENOL 8 HOUR ARTHRITIS PAIN) 650 MG CR tablet 1,300 mg, Every 8 hours PRN    Antiadrenergic Antihypertensives cloNIDine (CATAPRES) 0.1 MG tablet 0.1 mg, 3 times daily    Calcium Channel Blockers amLODipine (NORVASC) 10 MG tablet 10 mg, Daily    Contact [employer]ens [employer] Soft Lens Products (REWETTING DROPS) SOLN 1 drop, 3 times daily PRN    Corticosteroids - Topical hydrocortisone 2.5 % cream    Direct Factor Xa Inhibitors ELIQUIS 5 MG TABS tablet 5 mg, 2 times daily    H-2 Antagonists famotidine (PEPCID) 20 MG tablet    HMG CoA Reductase Inhibitors atorvastatin (LIPITOR) 10 MG tablet 10 mg, Daily    atorvastatin (LIPITOR)  20 MG tablet    Loop Diuretics furosemide (LASIX) 20 MG tablet    Nasal Steroids fluticasone (FLONASE) 50 MCG/ACT nasal spray 2 spray, Daily PRN    Nitrates isosorbide mononitrate (IMDUR) 60 MG 24 hr tablet 60 mg, Daily    nitroGLYCERIN (NITROSTAT) 0.4 MG SL tablet    Potassium potassium chloride SA (K-DUR,KLOR-CON) 20 MEQ tablet 20 mEq, Daily    Proton Pump Inhibitors pantoprazole (PROTONIX) 40 MG tablet    Sympathomimetics PROAIR HFA 108 (90 Base) MCG/ACT inhaler  Allergies  Allergen Reactions  . Azithromycin Swelling  .  Other Itching, Swelling, Other (See Comments) and Cough    Horse products  . Penicillins Swelling    Has patient had a PCN reaction causing immediate rash, facial/tongue/throat swelling, SOB or lightheadedness with hypotension: Yes Has patient had a PCN reaction causing severe rash involving mucus membranes or skin necrosis: No Has patient had a PCN reaction that required hospitalization: No Has patient had a PCN reaction occurring within the last 10 years: No If all of the above answers are "NO", then may proceed with Cephalosporin use.   . Tetanus Toxoids Swelling  . Gadolinium Derivatives Hives and Itching    Pt stated that her left arm was itching and I noticed that she had two hives on her chest. No difficulty breathing, sneezing. One 61m of benadryl was ordered by Dr. WJimmye Norman Pt remained at facility and was monitored before going home. -ldrake  . Morphine And Related Nausea And Vomiting  . Augmentin [Amoxicillin-Pot Clavulanate] Rash    Has patient had a PCN reaction causing immediate rash, facial/tongue/throat swelling, SOB or lightheadedness with hypotension: Yes Has patient had a PCN reaction causing severe rash involving mucus membranes or skin necrosis: Yes Has patient had a PCN reaction that required hospitalization:No Has patient had a PCN reaction occurring within the last 10 years: No If all of the above answers are "NO", then may proceed with Cephalosporin use.      Social History   Occupational History  . Not on file  Tobacco Use  . Smoking status: Never Smoker  . Smokeless tobacco: Never Used  Substance and Sexual Activity  . Alcohol use: Yes    Alcohol/week: 1.0 standard drinks    Types: 1 Standard drinks or equivalent per week    Comment: occ glass of wine  . Drug use: No    Frequency: 5.0 times per week  . Sexual activity: Yes    Partners: Male    Birth control/protection: Post-menopausal, Surgical    Comment: TAH/LSO     Family History  Problem  Relation Age of Onset  . Diabetes Mother   . Stroke Mother   . Hypertension Mother   . Heart disease Father   . Hypertension Father   . Cancer Father        pancreatic cancer  . Heart attack Father      Immunization History  Administered Date(s) Administered  . Influenza-Unspecified 12/16/2013     Review of systems: Positive Findings in bold print.  Constitutional:  chills, fatigue, fever, sweats, weight change Communication: tOptometrist sign lEcologist hand writing, iPad/Android device Head: headaches, head injury Eyes: changes in vision, eye pain, glaucoma, cataracts, macular degeneration, diplopia, glare,  light sensitivity, eyeglasses or contacts, blindness Ears nose mouth throat: hearing impaired, hearing aids,  ringing in ears, deaf, sign language,  vertigo,   nosebleeds,  rhinitis,  cold sores, snoring, swollen glands Cardiovascular: HTN, edema, arrhythmia, pacemaker in place, defibrillator in place,  chest pain/tightness, chronic anticoagulation, blood clot, heart failure, MI Peripheral Vascular: leg cramps, varicose veins, blood clots, lymphedema, varicosities Respiratory:  difficulty breathing, denies congestion, SOB, wheezing, cough, emphysema Gastrointestinal: change in appetite or weight, abdominal pain, constipation, diarrhea, nausea, vomiting, vomiting blood, change in bowel habits, abdominal pain, jaundice, rectal bleeding, hemorrhoids, GERD Genitourinary:  nocturia,  pain on urination, polyuria,  blood in urine, Foley catheter, urinary urgency, ESRD on hemodialysis Musculoskeletal: amputation, cramping, stiff joints, painful joints, decreased joint motion, fractures, OA, gout, hemiplegia, paraplegia, uses cane, wheelchair bound, uses walker, uses rollator Skin: +changes in toenails, color change, dryness, itching, mole changes,  rash, wound(s) Neurological: headaches, numbness in feet, paresthesias in feet, burning in feet, fainting,  seizures, change in  speech. denies headaches, memory problems/poor historian, cerebral palsy, weakness, paralysis, CVA, TIA Endocrine: diabetes, hypothyroidism, hyperthyroidism,  goiter, dry mouth, flushing, heat intolerance,  cold intolerance,  excessive thirst, denies polyuria,  nocturia Hematological:  easy bleeding, excessive bleeding, easy bruising, enlarged lymph nodes, on long term blood thinner, history of past transusions Allergy/immunological:  hives, eczema, frequent infections, multiple drug allergies, seasonal allergies, transplant recipient, multiple food allergies Psychiatric:  anxiety, depression, mood disorder, suicidal ideations, hallucinations, insomnia  Objective:  Vascular Examination: Capillary refill time immediate x 10 digits.  Dorsalis pedis pulses palpable b/l.   Posterior tibial pulses b/l.   Sparse digital hair x 10 digits.  Skin temperature gradient WNL b/l  Dermatological Examination: Skin with normal turgor, texture and tone b/l.  Toenails 1-5 b/l discolored, thick, dystrophic with subungual debris and pain with palpation to nailbeds due to thickness of nails.  Musculoskeletal: Muscle strength 5/5 to all LE muscle groups.   Neurological: Sensation intact with 10 gram monofilament.  Vibratory sensation intact.  Xrays b/l feet: Left foot xray: old fx osteophyte noted near 5th met base, asymptomatic; dorsal midfoot OA with DJD naviculocuneiform joint, plantar calcaneal spur Right foot xray: dorsal midfoot OA with DJD naviculocuneiform joint, plantar calcaneal spur, Tailor's bunion  Assessment: 1. Painful onychomycosis toenails 1-5 b/l  2. DJD naviculocuneiform joint b/l  3. Tailor's bunion right foot  Plan: 1. Discussed onychomycosis and treatment options.  Literature dispensed on today. 2. Xrays of both feet  taken and reviewed with patient. Discussed DJD and treatment options. 3. Toenails 1-5 b/l were debrided in length and girth without iatrogenic  bleeding. 4. Rx sent to [facility] for compounded pain cream to be applied to painful areas of feet 3-4 times/day. 5. Patient to continue soft, supportive shoe gear daily. 6. Patient to report any pedal injuries to medical professional immediately. 7. Follow up 9 weeks. 8. Patient/POA to call should there be a concern in the interim.

## 2018-12-21 ENCOUNTER — Telehealth: Payer: Self-pay | Admitting: Interventional Cardiology

## 2018-12-21 ENCOUNTER — Other Ambulatory Visit: Payer: Self-pay | Admitting: Interventional Cardiology

## 2018-12-21 MED ORDER — POTASSIUM CHLORIDE CRYS ER 20 MEQ PO TBCR: 20.0000 meq | EXTENDED_RELEASE_TABLET | Freq: Every day | ORAL | 2 refills | Status: DC

## 2018-12-21 NOTE — Telephone Encounter (Signed)
OK to switch to [employer]

## 2018-12-21 NOTE — Telephone Encounter (Signed)
[facility] mail order pharmacy is requesting a alternative for potassium chloride 20 MEQ. Pharmacy stating that this medication is no longer on the market. It is available as K-TAB 20 MEQ or Klor-Con M 20 MEQ (closest alternative to K-DUR). Please address

## 2018-12-22 MED ORDER — POTASSIUM CHLORIDE CRYS ER 20 MEQ PO TBCR: 20.0000 meq | EXTENDED_RELEASE_TABLET | Freq: Every day | ORAL | 3 refills | Status: DC

## 2018-12-22 NOTE — Telephone Encounter (Signed)
Rx sent in

## 2019-01-12 ENCOUNTER — Telehealth: Payer: Self-pay | Admitting: Interventional Cardiology

## 2019-01-12 ENCOUNTER — Encounter: Payer: Self-pay | Admitting: Podiatry

## 2019-01-12 ENCOUNTER — Other Ambulatory Visit: Payer: Self-pay

## 2019-01-12 ENCOUNTER — Ambulatory Visit (INDEPENDENT_AMBULATORY_CARE_PROVIDER_SITE_OTHER): Payer: Medicare Other | Admitting: Podiatry

## 2019-01-12 DIAGNOSIS — B351 Tinea unguium: Secondary | ICD-10-CM

## 2019-01-12 DIAGNOSIS — M79674 Pain in right toe(s): Secondary | ICD-10-CM | POA: Diagnosis not present

## 2019-01-12 DIAGNOSIS — M79675 Pain in left toe(s): Secondary | ICD-10-CM | POA: Diagnosis not present

## 2019-01-12 DIAGNOSIS — M722 Plantar fascial fibromatosis: Secondary | ICD-10-CM

## 2019-01-12 NOTE — Telephone Encounter (Signed)
Left message for patient to call back  

## 2019-01-12 NOTE — Telephone Encounter (Signed)
Pt c/o BP issue: STAT if pt c/o blurred vision, one-sided weakness or slurred speech  1. What are your last 5 BP readings?  09/30 AM: 164/90   09/29 AM: 165/88  PM: 157/85 09/28 AM: 149/87  PM: 166/86 09/27 AM: 156/83  PM: 181/100 09/26 AM: 162/88  PM: 179/87    2. Are you having any other symptoms (ex. Dizziness, headache, blurred vision, passed out)?  no  3. What is your BP issue? Patient is worried about the range of her BP.

## 2019-01-12 NOTE — Patient Instructions (Addendum)
Plantar Fasciitis (Heel Spur Syndrome) with Rehab The plantar fascia is a fibrous, ligament-like, soft-tissue structure that spans the bottom of the foot. Plantar fasciitis is a condition that causes pain in the foot due to inflammation of the tissue. SYMPTOMS   Pain and tenderness on the underneath side of the foot.  Pain that worsens with standing or walking. CAUSES  Plantar fasciitis is caused by irritation and injury to the plantar fascia on the underneath side of the foot. Common mechanisms of injury include:  Direct trauma to bottom of the foot.  Damage to a small nerve that runs under the foot where the main fascia attaches to the heel bone.  Stress placed on the plantar fascia due to bone spurs. RISK INCREASES WITH:   Activities that place stress on the plantar fascia (running, jumping, pivoting, or cutting).  Poor strength and flexibility.  Improperly fitted shoes.  Tight calf muscles.  Flat feet.  Failure to warm-up properly before activity.  Obesity. PREVENTION  Warm up and stretch properly before activity.  Allow for adequate recovery between workouts.  Maintain physical fitness:  Strength, flexibility, and endurance.  Cardiovascular fitness.  Maintain a health body weight.  Avoid stress on the plantar fascia.  Wear properly fitted shoes, including arch supports for individuals who have flat feet.  PROGNOSIS  If treated properly, then the symptoms of plantar fasciitis usually resolve without surgery. However, occasionally surgery is necessary.  RELATED COMPLICATIONS   Recurrent symptoms that may result in a chronic condition.  Problems of the lower back that are caused by compensating for the injury, such as limping.  Pain or weakness of the foot during push-off following surgery.  Chronic inflammation, scarring, and partial or complete fascia tear, occurring more often from repeated injections.  TREATMENT  Treatment initially involves the  use of ice and medication to help reduce pain and inflammation. The use of strengthening and stretching exercises may help reduce pain with activity, especially stretches of the Achilles tendon. These exercises may be performed at home or with a therapist. Your caregiver may recommend that you use heel cups of arch supports to help reduce stress on the plantar fascia. Occasionally, corticosteroid injections are given to reduce inflammation. If symptoms persist for greater than 6 months despite non-surgical (conservative), then surgery may be recommended.   MEDICATION   If pain medication is necessary, then nonsteroidal anti-inflammatory medications, such as aspirin and ibuprofen, or other minor pain relievers, such as acetaminophen, are often recommended.  Do not take pain medication within 7 days before surgery.  Prescription pain relievers may be given if deemed necessary by your caregiver. Use only as directed and only as much as you need.  Corticosteroid injections may be given by your caregiver. These injections should be reserved for the most serious cases, because they may only be given a certain number of times.  HEAT AND COLD  Cold treatment (icing) relieves pain and reduces inflammation. Cold treatment should be applied for 10 to 15 minutes every 2 to 3 hours for inflammation and pain and immediately after any activity that aggravates your symptoms. Use ice packs or massage the area with a piece of ice (ice massage).  Heat treatment may be used prior to performing the stretching and strengthening activities prescribed by your caregiver, physical therapist, or athletic trainer. Use a heat pack or soak the injury in warm water.  SEEK IMMEDIATE MEDICAL CARE IF:  Treatment seems to offer no benefit, or the condition worsens.  Any medications   produce adverse side effects.  EXERCISES- RANGE OF MOTION (ROM) AND STRETCHING EXERCISES - Plantar Fasciitis (Heel Spur Syndrome) These exercises  may help you when beginning to rehabilitate your injury. Your symptoms may resolve with or without further involvement from your physician, physical therapist or athletic trainer. While completing these exercises, remember:   Restoring tissue flexibility helps normal motion to return to the joints. This allows healthier, less painful movement and activity.  An effective stretch should be held for at least 30 seconds.  A stretch should never be painful. You should only feel a gentle lengthening or release in the stretched tissue.  RANGE OF MOTION - Toe Extension, Flexion  Sit with your right / left leg crossed over your opposite knee.  Grasp your toes and gently pull them back toward the top of your foot. You should feel a stretch on the bottom of your toes and/or foot.  Hold this stretch for 10 seconds.  Now, gently pull your toes toward the bottom of your foot. You should feel a stretch on the top of your toes and or foot.  Hold this stretch for 10 seconds. Repeat  times. Complete this stretch 3 times per day.   RANGE OF MOTION - Ankle Dorsiflexion, Active Assisted  Remove shoes and sit on a chair that is preferably not on a carpeted surface.  Place right / left foot under knee. Extend your opposite leg for support.  Keeping your heel down, slide your right / left foot back toward the chair until you feel a stretch at your ankle or calf. If you do not feel a stretch, slide your bottom forward to the edge of the chair, while still keeping your heel down.  Hold this stretch for 10 seconds. Repeat 3 times. Complete this stretch 2 times per day.   STRETCH  Gastroc, Standing  Place hands on wall.  Extend right / left leg, keeping the front knee somewhat bent.  Slightly point your toes inward on your back foot.  Keeping your right / left heel on the floor and your knee straight, shift your weight toward the wall, not allowing your back to arch.  You should feel a gentle stretch  in the right / left calf. Hold this position for 10 seconds. Repeat 3 times. Complete this stretch 2 times per day.  STRETCH  Soleus, Standing  Place hands on wall.  Extend right / left leg, keeping the other knee somewhat bent.  Slightly point your toes inward on your back foot.  Keep your right / left heel on the floor, bend your back knee, and slightly shift your weight over the back leg so that you feel a gentle stretch deep in your back calf.  Hold this position for 10 seconds. Repeat 3 times. Complete this stretch 2 times per day.  STRETCH  Gastrocsoleus, Standing  Note: This exercise can place a lot of stress on your foot and ankle. Please complete this exercise only if specifically instructed by your caregiver.   Place the ball of your right / left foot on a step, keeping your other foot firmly on the same step.  Hold on to the wall or a rail for balance.  Slowly lift your other foot, allowing your body weight to press your heel down over the edge of the step.  You should feel a stretch in your right / left calf.  Hold this position for 10 seconds.  Repeat this exercise with a slight bend in your right /   left knee. Repeat 3 times. Complete this stretch 2 times per day.   STRENGTHENING EXERCISES - Plantar Fasciitis (Heel Spur Syndrome)  These exercises may help you when beginning to rehabilitate your injury. They may resolve your symptoms with or without further involvement from your physician, physical therapist or athletic trainer. While completing these exercises, remember:   Muscles can gain both the endurance and the strength needed for everyday activities through controlled exercises.  Complete these exercises as instructed by your physician, physical therapist or athletic trainer. Progress the resistance and repetitions only as guided.  STRENGTH - Towel Curls  Sit in a chair positioned on a non-carpeted surface.  Place your foot on a towel, keeping your heel  on the floor.  Pull the towel toward your heel by only curling your toes. Keep your heel on the floor. Repeat 3 times. Complete this exercise 2 times per day.  STRENGTH - Ankle Inversion  Secure one end of a rubber exercise band/tubing to a fixed object (table, pole). Loop the other end around your foot just before your toes.  Place your fists between your knees. This will focus your strengthening at your ankle.  Slowly, pull your big toe up and in, making sure the band/tubing is positioned to resist the entire motion.  Hold this position for 10 seconds.  Have your muscles resist the band/tubing as it slowly pulls your foot back to the starting position. Repeat 3 times. Complete this exercises 2 times per day.  Document Released: 03/31/2005 Document Revised: 06/23/2011 Document Reviewed: 07/13/2008 [employer] Patient Information 2014 [employer]  Onychomycosis/Fungal Toenails  WHAT IS IT? An infection that lies within the keratin of your nail plate that is caused by a fungus.  WHY ME? Fungal infections affect all ages, sexes, races, and creeds.  There may be many factors that predispose you to a fungal infection such as age, coexisting medical conditions such as diabetes, or an autoimmune disease; stress, medications, fatigue, genetics, etc.  Bottom line: fungus thrives in a warm, moist environment and your shoes offer such a location.  IS IT CONTAGIOUS? Theoretically, yes.  You do not want to share shoes, nail clippers or files with someone who has fungal toenails.  Walking around barefoot in the same room or sleeping in the same bed is unlikely to transfer the organism.  It is important to realize, however, that fungus can spread easily from one nail to the next on the same foot.  HOW DO WE TREAT THIS?  There are several ways to treat this condition.  Treatment may depend on many factors such as age, medications, pregnancy, liver and kidney conditions, etc.  It is best to ask your  doctor which options are available to you.  1. No treatment.   Unlike many other medical concerns, you can live with this condition.  However for many people this can be a painful condition and may lead to ingrown toenails or a bacterial infection.  It is recommended that you keep the nails cut short to help reduce the amount of fungal nail. 2. Topical treatment.  These range from herbal remedies to prescription strength nail lacquers.  About 40-50% effective, topicals require twice daily application for approximately 9 to 12 months or until an entirely new nail has grown out.  The most effective topicals are medical grade medications available through physicians offices. 3. Oral antifungal medications.  With an 80-90% cure rate, the most common oral medication requires 3 to 4 months of therapy and stays   in your system for a year as the new nail grows out.  Oral antifungal medications do require blood work to make sure it is a safe drug for you.  A liver function panel will be performed prior to starting the medication and after the first month of treatment.  It is important to have the blood work performed to avoid any harmful side effects.  In general, this medication safe but blood work is required. 4. Laser Therapy.  This treatment is performed by applying a specialized laser to the affected nail plate.  This therapy is noninvasive, fast, and non-painful.  It is not covered by insurance and is therefore, out of pocket.  The results have been very good with a 80-95% cure rate.  [facility] is the only practice in the area to offer this therapy. 5. Permanent Nail Avulsion.  Removing the entire nail so that a new nail will not grow back. 

## 2019-01-13 NOTE — Progress Notes (Signed)
Subjective: [name] is seen today for follow up painful, elongated, thickened toenails 1-5 b/l feet that she cannot cut. Pain interferes with daily activities. Aggravating factor includes wearing enclosed shoe gear and relieved with periodic debridement.  Patient states she has h/o plantar fasciitis and is requesting orthotics. She states her right foot is symptomatic on today's visit.   Current Outpatient Medications on File Prior to Visit  Medication Sig  . acetaminophen (TYLENOL 8 HOUR ARTHRITIS PAIN) 650 MG CR tablet Take 1,300 mg every 8 (eight) hours as needed by mouth for pain.  Marland Kitchen amLODipine (NORVASC) 10 MG tablet Take 1 tablet (10 mg total) by mouth daily.  Marland Kitchen atorvastatin (LIPITOR) 10 MG tablet Take 1 tablet (10 mg total) by mouth daily.  Marland Kitchen atorvastatin (LIPITOR) 20 MG tablet   . cloNIDine (CATAPRES) 0.1 MG tablet Take 1 tablet (0.1 mg total) by mouth 3 (three) times daily.  Marland Kitchen ELIQUIS 5 MG TABS tablet Take 5 mg by mouth 2 (two) times daily.  . famotidine (PEPCID) 20 MG tablet   . fluticasone (FLONASE) 50 MCG/ACT nasal spray Place 2 sprays into both nostrils daily as needed for allergies.  . furosemide (LASIX) 20 MG tablet Take 1 tablet (20 mg total) 4 days a week  . hydrocortisone 2.5 % cream APPLY TO EARS 3 TIMES A DAY FOR 3 WEEKS  . isosorbide mononitrate (IMDUR) 60 MG 24 hr tablet Take 1 tablet (60 mg total) by mouth daily.  Marland Kitchen labetalol (NORMODYNE) 100 MG tablet Take 200 mg by mouth 2 (two) times daily.  Marland Kitchen labetalol (NORMODYNE) 200 MG tablet Take 200 mg by mouth 2 (two) times daily.  Marland Kitchen lisinopril (ZESTRIL) 40 MG tablet Take 1 tablet (40 mg total) by mouth daily.  . nitroGLYCERIN (NITROSTAT) 0.4 MG SL tablet DISSOLVE 1 TABLET UNDER THE TONGUE EVERY 5 MINUTES AS NEEDED FOR CHEST PAIN  . NON FORMULARY Pain cream for arthritis sent to [facility], faxed 11/01/2018 [facility]  . ondansetron (ZOFRAN) 4 MG tablet   . pantoprazole (PROTONIX) 40 MG tablet   . potassium chloride  SA (KLOR-CON M20) 20 MEQ tablet Take 1 tablet (20 mEq total) by mouth daily.  Marland Kitchen PROAIR HFA 108 (90 Base) MCG/ACT inhaler Inhale 1 puff into the lungs every 4 (four) hours as needed for wheezing or shortness of breath. q4h  . Soft Lens Products (REWETTING DROPS) SOLN Place 1 drop 3 (three) times daily as needed into both eyes (for dry/irritated contact lenses).   No current facility-administered medications on file prior to visit.     Allergies  Allergen Reactions  . Azithromycin Swelling  . Other Itching, Swelling, Other (See Comments) and Cough    Horse products  . Penicillins Swelling    Has patient had a PCN reaction causing immediate rash, facial/tongue/throat swelling, SOB or lightheadedness with hypotension: Yes Has patient had a PCN reaction causing severe rash involving mucus membranes or skin necrosis: No Has patient had a PCN reaction that required hospitalization: No Has patient had a PCN reaction occurring within the last 10 years: No If all of the above answers are "NO", then may proceed with Cephalosporin use.   . Tetanus Toxoids Swelling  . Gadolinium Derivatives Hives and Itching    Pt stated that her left arm was itching and I noticed that she had two hives on her chest. No difficulty breathing, sneezing. One 25mg  of benadryl was ordered by Dr. Jimmye Norman. Pt remained at facility and was monitored before going home. -ldrake  .  Morphine And Related Nausea And Vomiting  . Latex   . Augmentin [Amoxicillin-Pot Clavulanate] Rash    Has patient had a PCN reaction causing immediate rash, facial/tongue/throat swelling, SOB or lightheadedness with hypotension: Yes Has patient had a PCN reaction causing severe rash involving mucus membranes or skin necrosis: Yes Has patient had a PCN reaction that required hospitalization:No Has patient had a PCN reaction occurring within the last 10 years: No If all of the above answers are "NO", then may proceed with Cephalosporin use.    Objective:  Vascular Examination: Capillary refill time immediate x 10 digits.  Dorsalis pedis present b/l.  Posterior tibial pulses present b/l.  Digital hair sparse x 10 digits.  Skin temperature gradient WNL b/l.   Dermatological Examination: Skin with normal turgor, texture and tone b/l.  Toenails 1-5 b/l discolored, thick, dystrophic with subungual debris and pain with palpation to nailbeds due to thickness of nails.  Musculoskeletal: Muscle strength 5/5 to all LE muscle groups.  Minimal tenderness to palpation right plantarmedial calcaneal tubercle.  No pain, crepitus or joint limitation noted with ROM.   Neurological Examination: Protective sensation intact 5/5 b/l with 10 gram monofilament.  Epicritic sensation present bilaterally.  Vibratory sensation intact bilaterally.   Assessment: Painful onychomycosis toenails 1-5 b/l  Plantar fasciitis right heel  Plan: 1. Toenails 1-5 b/l were debrided in length and girth without iatrogenic bleeding. 2. Dispensed plantar fascial brace and stretching exercises. Will check insurance benefits for orthotics. Patient had xrays taken on last visit and has calcaneal spur.  3. Patient to continue soft, supportive shoe gear. 4. Patient to report any pedal injuries to medical professional immediately. 5. Follow up 9 weeks. 6. Patient/POA to call should there be a concern in the interim.

## 2019-01-13 NOTE — Telephone Encounter (Signed)
New Message ° ° °Patient returning your call. °

## 2019-01-13 NOTE — Telephone Encounter (Signed)
Called and spoke to patient regarding BP readings below. Denies having any Sx. Patient is taking meds as prescribed. Admits to eating foods that are high in salt. Patient educated. Video appt made with Dr. Irish Lack tomorrow to discuss BP control.

## 2019-01-14 ENCOUNTER — Other Ambulatory Visit: Payer: Self-pay

## 2019-01-14 ENCOUNTER — Encounter: Payer: Self-pay | Admitting: Interventional Cardiology

## 2019-01-14 ENCOUNTER — Telehealth (INDEPENDENT_AMBULATORY_CARE_PROVIDER_SITE_OTHER): Payer: Medicare Other | Admitting: Interventional Cardiology

## 2019-01-14 VITALS — BP 154/90 | HR 68 | Wt 210.8 lb

## 2019-01-14 DIAGNOSIS — E782 Mixed hyperlipidemia: Secondary | ICD-10-CM

## 2019-01-14 DIAGNOSIS — I10 Essential (primary) hypertension: Secondary | ICD-10-CM

## 2019-01-14 DIAGNOSIS — I5032 Chronic diastolic (congestive) heart failure: Secondary | ICD-10-CM | POA: Diagnosis not present

## 2019-01-14 DIAGNOSIS — I2510 Atherosclerotic heart disease of native coronary artery without angina pectoris: Secondary | ICD-10-CM | POA: Diagnosis not present

## 2019-01-14 DIAGNOSIS — I2583 Coronary atherosclerosis due to lipid rich plaque: Secondary | ICD-10-CM

## 2019-01-14 MED ORDER — SPIRONOLACTONE 25 MG PO TABS: 25.0000 mg | ORAL_TABLET | Freq: Every day | ORAL | 0 refills | Status: DC

## 2019-01-14 MED ORDER — FUROSEMIDE 20 MG PO TABS: 20.0000 mg | ORAL_TABLET | Freq: Every day | ORAL | 3 refills | Status: DC

## 2019-01-14 NOTE — Progress Notes (Signed)
Virtual Visit via Video Note   This visit type was conducted due to national recommendations for restrictions regarding the COVID-19 Pandemic (e.g. social distancing) in an effort to limit this patient's exposure and mitigate transmission in our community.  Due to her co-morbid illnesses, this patient is at least at moderate risk for complications without adequate follow up.  This format is felt to be most appropriate for this patient at this time.  All issues noted in this document were discussed and addressed.  A limited physical exam was performed with this format.  Please refer to the patient's chart for her consent to telehealth for [facility].   Date:  01/14/2019   ID:  [name], DOB 04/13/1946, MRN [id number]  Patient Location: Home Provider Location: Office  PCP:  Leighton Ruff, MD  Cardiologist:  Larae Grooms, MD  Electrophysiologist:  None   Evaluation Performed:  Follow-Up Visit  Chief Complaint:  HTN  History of Present Illness:    [name] is a 73 y.o. female with CAD. She had PCI in 1999 and 2002. She had a cath without obstructive disease in 2015.  Atpriorvisits,I stressed the importance of more exercise to her for her overall well-being.  She has had a chronic headache and is now seeing neuro.  She had acute diastolic heart failure after a trip to [place] and [place]. She returned in March 2020. She was tested for COVID and this was negative.  She was diuresed 20 lbs. She may have eaten more salt. Troponin was negative. BNP was elevated.   She was discharged in Lasix 20 mg daily, discharged from [facility] at the end of March 2020.  She used Biotin for hair loss.   In 08/2018, Some high BP readings, up to the 160s.  At last visit, plan was : Increase amlopdine to 10 mg daily.   A few weeks later: "She will move amlodipine and lisinopril to evening.  She will call in with readings.  If too low in AM, move amlodipine back  to AM."  BP readings have still been high.  THere have been readings in the 140-160s range in the morning.  In the evening, readings have been similar.  180 spike was the highest.   Occasional headache.  The patient does not have symptoms concerning for COVID-19 infection (fever, chills, cough, or new shortness of breath).    Past Medical History:  Diagnosis Date  . Anxiety   . Arthritis   . Bone spur   . Coronary artery disease    stent - 1999  . Depression   . Fibrocystic breast changes   . GERD (gastroesophageal reflux disease)   . Glucosuria   . Hernia, inguinal, left 10/2002  . Hypertension   . Melanoma ([place]) 10/2008   right shoulder and arm  . Menorrhagia   . Myocardial infarction ([place]) 1999   Past Surgical History:  Procedure Laterality Date  . ANGIOPLASTY  2000  . APPENDECTOMY    . bone chip removed from left foot     . CHOLECYSTECTOMY    . COLONOSCOPY WITH PROPOFOL N/A 03/03/2017   Procedure: COLONOSCOPY WITH PROPOFOL;  Surgeon: Juanita Craver, MD;  Location: [facility];  Service: Endoscopy;  Laterality: N/A;  . CORONARY ANGIOPLASTY WITH STENT PLACEMENT  10/99  . ESOPHAGOGASTRODUODENOSCOPY (EGD) WITH PROPOFOL N/A 03/03/2017   Procedure: ESOPHAGOGASTRODUODENOSCOPY (EGD) WITH PROPOFOL;  Surgeon: Juanita Craver, MD;  Location: [facility];  Service: Endoscopy;  Laterality: N/A;  . HERNIA REPAIR  umbilical   . HYSTEROSCOPY  2/98   D&C (polyps)  . LEFT HEART CATHETERIZATION WITH CORONARY ANGIOGRAM N/A 01/25/2014   Procedure: LEFT HEART CATHETERIZATION WITH CORONARY ANGIOGRAM;  Surgeon: Jettie Booze, MD;  Location[facility] C[facility] LAB;  Service: Cardiovascular;  Laterality: N/A;  . NODE DISSECTION     neg  . TOTAL ABDOMINAL HYSTERECTOMY     LSO     Failed TVH     Current Meds  Medication Sig  . acetaminophen (TYLENOL 8 HOUR ARTHRITIS PAIN) 650 MG CR tablet Take 1,300 mg every 8 (eight) hours as needed by mouth for pain.  Marland Kitchen amLODipine (NORVASC) 10 MG tablet Take  1 tablet (10 mg total) by mouth daily.  Marland Kitchen atorvastatin (LIPITOR) 10 MG tablet Take 1 tablet (10 mg total) by mouth daily.  Marland Kitchen atorvastatin (LIPITOR) 20 MG tablet   . cloNIDine (CATAPRES) 0.1 MG tablet Take 1 tablet (0.1 mg total) by mouth 3 (three) times daily.  Marland Kitchen ELIQUIS 5 MG TABS tablet Take 5 mg by mouth 2 (two) times daily.  . famotidine (PEPCID) 20 MG tablet   . fluticasone (FLONASE) 50 MCG/ACT nasal spray Place 2 sprays into both nostrils daily as needed for allergies.  . furosemide (LASIX) 20 MG tablet Take 1 tablet (20 mg total) 4 days a week  . hydrocortisone 2.5 % cream APPLY TO EARS 3 TIMES A DAY FOR 3 WEEKS  . isosorbide mononitrate (IMDUR) 60 MG 24 hr tablet Take 1 tablet (60 mg total) by mouth daily.  Marland Kitchen labetalol (NORMODYNE) 200 MG tablet Take 200 mg by mouth 2 (two) times daily.  Marland Kitchen lisinopril (ZESTRIL) 40 MG tablet Take 1 tablet (40 mg total) by mouth daily.  . nitroGLYCERIN (NITROSTAT) 0.4 MG SL tablet DISSOLVE 1 TABLET UNDER THE TONGUE EVERY 5 MINUTES AS NEEDED FOR CHEST PAIN  . NON FORMULARY Pain cream for arthritis sent to [facility], faxed 11/01/2018 HS-CMA  . ondansetron (ZOFRAN) 4 MG tablet   . pantoprazole (PROTONIX) 40 MG tablet   . potassium chloride SA (KLOR-CON M20) 20 MEQ tablet Take 1 tablet (20 mEq total) by mouth daily.  Marland Kitchen PROAIR HFA 108 (90 Base) MCG/ACT inhaler Inhale 1 puff into the lungs every 4 (four) hours as needed for wheezing or shortness of breath. q4h  . Soft Lens Products (REWETTING DROPS) SOLN Place 1 drop 3 (three) times daily as needed into both eyes (for dry/irritated contact lenses).     Allergies:   Azithromycin, Other, Penicillins, Tetanus toxoids, Gadolinium derivatives, Morphine and related, Latex, and Augmentin [amoxicillin-pot clavulanate]   Social History   Tobacco Use  . Smoking status: Never Smoker  . Smokeless tobacco: Never Used  Substance Use Topics  . Alcohol use: Yes    Alcohol/week: 1.0 standard drinks    Types: 1  Standard drinks or equivalent per week    Comment: occ glass of wine  . Drug use: No    Frequency: 5.0 times per week     Family Hx: The patient's family history includes Cancer in her father; Diabetes in her mother; Heart attack in her father; Heart disease in her father; Hypertension in her father and mother; Stroke in her mother.  ROS:   Please see the history of present illness.    Dry mouth All other systems reviewed and are negative.   Prior CV studies:   The following studies were reviewed today: labs, potassium normal in 06/2018    Labs/Other Tests and Data Reviewed:    EKG:  No ECG reviewed.  Recent Labs: 07/09/2018: ALT 31; B Natriuretic Peptide 282.1; Hemoglobin 11.1; Platelets 179 07/10/2018: Magnesium 1.6 07/12/2018: BUN 9; Creatinine, Ser 1.04; Potassium 4.1; Sodium 141   Recent Lipid Panel No results found for: CHOL, TRIG, HDL, CHOLHDL, LDLCALC, LDLDIRECT  Wt Readings from Last 3 Encounters:  01/14/19 210 lb 12.8 oz (95.6 kg)  08/17/18 203 lb (92.1 kg)  08/03/18 202 lb (91.6 kg)     Objective:    Vital Signs:  BP (!) 154/90   Pulse 68   Wt 210 lb 12.8 oz (95.6 kg)   BMI 37.34 kg/m    VITAL SIGNS:  reviewed GEN:  no acute distress RESPIRATORY:  no shortness of breath PSYCH:  normal affect exam limited by video format  ASSESSMENT & PLAN:    1. CAD: No angina.  COntinue aggressive secondary prevention.  2. HTN: She sems to feel better when she takes her Lasix daily.  Add spironolactone 25 mg daily.  Continue lasix daily.  BMet next week.   Recheck in a few weeks.  If spironolactone helps get BP to therapeutic range, would stop clonidine. Refill Lasix for daily use.  3. Headache: Likely related to increased BP.    COVID-19 Education: The signs and symptoms of COVID-19 were discussed with the patient and how to seek care for testing (follow up with PCP or arrange E-visit).  The importance of social distancing was discussed today.  Time:   Today, I  have spent 15 minutes with the patient with telehealth technology discussing the above problems.     Medication Adjustments/Labs and Tests Ordered: Current medicines are reviewed at length with the patient today.  Concerns regarding medicines are outlined above.   Tests Ordered: No orders of the defined types were placed in this encounter.   Medication Changes: No orders of the defined types were placed in this encounter.   Follow Up:  Virtual Visit in 3 week(s)  Signed, Larae Grooms, MD  01/14/2019 1:46 PM    [facility]

## 2019-01-14 NOTE — Patient Instructions (Addendum)
Medication Instructions:  Your physician has recommended you make the following change in your medication:   1. CONTINUE: furosemide (lasix) 20 mg tablet: Take 1 tablet by mouth once a day  2. START: spironolactone (aldactone) 25 mg tablet: Take 1 tablet by mouth once a day   Lab work: Please have your Primary Care Doctor Check labs (BMET) in 1 week  If you have labs (blood work) drawn today and your tests are completely normal, you will receive your results only by: Marland Kitchen MyChart Message (if you have MyChart) OR . A paper copy in the mail If you have any lab test that is abnormal or we need to change your treatment, we will call you to review the results.  Testing/Procedures: None ordered  Follow-Up: . Follow up with Dr. Irish Lack via [place] on 02/01/19 at 11:40 AM  Any Other Special Instructions Will Be Listed Below (If Applicable).

## 2019-01-20 ENCOUNTER — Other Ambulatory Visit: Payer: Self-pay

## 2019-01-20 DIAGNOSIS — Z20822 Contact with and (suspected) exposure to covid-19: Secondary | ICD-10-CM

## 2019-01-21 LAB — NOVEL CORONAVIRUS, NAA: SARS-CoV-2, NAA: NOT DETECTED

## 2019-01-24 ENCOUNTER — Other Ambulatory Visit: Payer: Self-pay

## 2019-01-24 ENCOUNTER — Telehealth: Payer: Self-pay | Admitting: Interventional Cardiology

## 2019-01-24 ENCOUNTER — Other Ambulatory Visit: Payer: Medicare Other | Admitting: *Deleted

## 2019-01-24 DIAGNOSIS — I5031 Acute diastolic (congestive) heart failure: Secondary | ICD-10-CM

## 2019-01-24 DIAGNOSIS — I5032 Chronic diastolic (congestive) heart failure: Secondary | ICD-10-CM

## 2019-01-24 DIAGNOSIS — I10 Essential (primary) hypertension: Secondary | ICD-10-CM

## 2019-01-24 DIAGNOSIS — I2510 Atherosclerotic heart disease of native coronary artery without angina pectoris: Secondary | ICD-10-CM

## 2019-01-24 DIAGNOSIS — Z79899 Other long term (current) drug therapy: Secondary | ICD-10-CM

## 2019-01-24 DIAGNOSIS — I2583 Coronary atherosclerosis due to lipid rich plaque: Secondary | ICD-10-CM

## 2019-01-24 LAB — BASIC METABOLIC PANEL
BUN/Creatinine Ratio: 17 (ref 12–28)
BUN: 19 mg/dL (ref 8–27)
CO2: 22 mmol/L (ref 20–29)
Calcium: 9.5 mg/dL (ref 8.7–10.3)
Chloride: 104 mmol/L (ref 96–106)
Creatinine, Ser: 1.14 mg/dL — ABNORMAL HIGH (ref 0.57–1.00)
GFR calc Af Amer: 56 mL/min/{1.73_m2} — ABNORMAL LOW (ref >59)
GFR calc non Af Amer: 48 mL/min/{1.73_m2} — ABNORMAL LOW (ref >59)
Glucose: 113 mg/dL — ABNORMAL HIGH (ref 65–99)
Potassium: 4.5 mmol/L (ref 3.5–5.2)
Sodium: 140 mmol/L (ref 134–144)

## 2019-01-24 NOTE — Telephone Encounter (Signed)
Pt advised she will have her BMET at our office done today.

## 2019-01-24 NOTE — Telephone Encounter (Signed)
New Message  Patient is calling back due to her PCP not being able to do the blood work that was requested by Dr. Irish Lack. Patient states that she can come into the office to get the blood work done. There are currently no orders in the chart for blood work. Please assist with getting orders put in and patient scheduled for lab work.

## 2019-01-31 NOTE — Progress Notes (Signed)
Virtual Visit via Video Note   This visit type was conducted due to national recommendations for restrictions regarding the COVID-19 Pandemic (e.g. social distancing) in an effort to limit this patient's exposure and mitigate transmission in our community.  Due to her co-morbid illnesses, this patient is at least at moderate risk for complications without adequate follow up.  This format is felt to be most appropriate for this patient at this time.  All issues noted in this document were discussed and addressed.  A limited physical exam was performed with this format.  Please refer to the patient's chart for her consent to telehealth for [facility].   Date:  02/01/2019   ID:  [name], DOB January 07, 1946, MRN [id number]  Patient Location: Home Provider Location: Office  PCP:  Leighton Ruff, MD  Cardiologist:  Larae Grooms, MD  Electrophysiologist:  None   Evaluation Performed:  Follow-Up Visit  Chief Complaint:  HTN  History of Present Illness:    [name] is a 73 y.o. female with CAD. She had PCI in 1999 and 2002. She had a cath without obstructive disease in 2015.  Atpriorvisits,I stressed the importance of more exercise to her for her overall well-being.  She has had a chronic headache and is now seeing neuro.  She had acute diastolic heart failure after a trip to [place] and [place]. She returned in March 2020. She was tested for COVID and this was negative.  She was diuresed 20 lbs. She may have eaten more salt. Troponin was negative. BNP was elevated.   She was discharged in Lasix 20 mg daily, discharged from [facility] at the end of March 2020.  She used Biotin for hair loss.   In 08/2018, Some high BP readings, up to the 160s.At last visit, plan was :Increase amlopdine to 10 mg daily.   A few weeks later: "She will move amlodipine and lisinopril to evening. She will call in with readings. If too low in AM, move amlodipine back  to AM."  BP readings have still been high.  THere have been readings in the 140-160s range in the morning.  In the evening, readings have been similar.  180 spike was the highest.   Occasional headache.  The patient does not have symptoms concerning for COVID-19 infection (fever, chills, cough, or new shortness of breath).   At 01/14/2019 visit, plan was :"Add spironolactone 25 mg daily.  Continue lasix daily.  BMet next week.   Recheck in a few weeks.  If spironolactone helps get BP to therapeutic range, would stop clonidine. Refill Lasix for daily use. "  BP has ranged in the 130s-150 range.   Past Medical History:  Diagnosis Date  . Anxiety   . Arthritis   . Bone spur   . Coronary artery disease    stent - 1999  . Depression   . Fibrocystic breast changes   . GERD (gastroesophageal reflux disease)   . Glucosuria   . Hernia, inguinal, left 10/2002  . Hypertension   . Melanoma ([place]) 10/2008   right shoulder and arm  . Menorrhagia   . Myocardial infarction ([place]) 1999   Past Surgical History:  Procedure Laterality Date  . ANGIOPLASTY  2000  . APPENDECTOMY    . bone chip removed from left foot     . CHOLECYSTECTOMY    . COLONOSCOPY WITH PROPOFOL N/A 03/03/2017   Procedure: COLONOSCOPY WITH PROPOFOL;  Surgeon: Juanita Craver, MD;  Location: [facility];  Service:  Endoscopy;  Laterality: N/A;  . CORONARY ANGIOPLASTY WITH STENT PLACEMENT  10/99  . ESOPHAGOGASTRODUODENOSCOPY (EGD) WITH PROPOFOL N/A 03/03/2017   Procedure: ESOPHAGOGASTRODUODENOSCOPY (EGD) WITH PROPOFOL;  Surgeon: Juanita Craver, MD;  Location: [facility];  Service: Endoscopy;  Laterality: N/A;  . HERNIA REPAIR     umbilical   . HYSTEROSCOPY  2/98   D&C (polyps)  . LEFT HEART CATHETERIZATION WITH CORONARY ANGIOGRAM N/A 01/25/2014   Procedure: LEFT HEART CATHETERIZATION WITH CORONARY ANGIOGRAM;  Surgeon: Jettie Booze, MD;  Location: [facility] CATH LAB;  Service: Cardiovascular;  Laterality: N/A;  . NODE  DISSECTION     neg  . TOTAL ABDOMINAL HYSTERECTOMY     LSO     Failed TVH     Current Meds  Medication Sig  . acetaminophen (TYLENOL 8 HOUR ARTHRITIS PAIN) 650 MG CR tablet Take 1,300 mg every 8 (eight) hours as needed by mouth for pain.  Marland Kitchen amLODipine (NORVASC) 10 MG tablet Take 1 tablet (10 mg total) by mouth daily.  Marland Kitchen atorvastatin (LIPITOR) 20 MG tablet Take 20 mg by mouth daily at 6 PM.   . BIOTIN PO Take by mouth.  Marland Kitchen CINNAMON PO Take by mouth.  . cloNIDine (CATAPRES) 0.1 MG tablet Take 1 tablet (0.1 mg total) by mouth 3 (three) times daily.  Marland Kitchen ELIQUIS 5 MG TABS tablet Take 5 mg by mouth 2 (two) times daily.  . famotidine (PEPCID) 20 MG tablet   . furosemide (LASIX) 20 MG tablet Take 1 tablet (20 mg total) by mouth daily.  . isosorbide mononitrate (IMDUR) 60 MG 24 hr tablet Take 1 tablet (60 mg total) by mouth daily.  Marland Kitchen labetalol (NORMODYNE) 200 MG tablet Take 200 mg by mouth 2 (two) times daily.  Marland Kitchen lisinopril (ZESTRIL) 40 MG tablet Take 1 tablet (40 mg total) by mouth daily.  . nitroGLYCERIN (NITROSTAT) 0.4 MG SL tablet DISSOLVE 1 TABLET UNDER THE TONGUE EVERY 5 MINUTES AS NEEDED FOR CHEST PAIN  . NON FORMULARY Pain cream for arthritis sent to [facility], faxed 11/01/2018 HS-CMA  . potassium chloride SA (KLOR-CON M20) 20 MEQ tablet Take 1 tablet (20 mEq total) by mouth daily.  . Soft Lens Products (REWETTING DROPS) SOLN Place 1 drop 3 (three) times daily as needed into both eyes (for dry/irritated contact lenses).  Marland Kitchen spironolactone (ALDACTONE) 25 MG tablet Take 1 tablet (25 mg total) by mouth daily.  Marland Kitchen VITAMIN D PO Take 2,000 mg by mouth daily.     Allergies:   Azithromycin, Other, Penicillins, Tetanus toxoids, Gadolinium derivatives, Morphine and related, Latex, and Augmentin [amoxicillin-pot clavulanate]   Social History   Tobacco Use  . Smoking status: Never Smoker  . Smokeless tobacco: Never Used  Substance Use Topics  . Alcohol use: Yes    Alcohol/week: 1.0  standard drinks    Types: 1 Standard drinks or equivalent per week    Comment: occ glass of wine  . Drug use: No    Frequency: 5.0 times per week     Family Hx: The patient's family history includes Cancer in her father; Diabetes in her mother; Heart attack in her father; Heart disease in her father; Hypertension in her father and mother; Stroke in her mother.  ROS:   Please see the history of present illness.    Tolerating meds well.  Negative COVID test last week.  She feels cold at times.  All other systems reviewed and are negative.   Prior CV studies:   The following studies were  reviewed today:    Labs/Other Tests and Data Reviewed:    EKG:  No ECG reviewed.  Recent Labs: 07/09/2018: ALT 31; B Natriuretic Peptide 282.1; Hemoglobin 11.1; Platelets 179 07/10/2018: Magnesium 1.6 01/24/2019: BUN 19; Creatinine, Ser 1.14; Potassium 4.5; Sodium 140   Recent Lipid Panel No results found for: CHOL, TRIG, HDL, CHOLHDL, LDLCALC, LDLDIRECT  Wt Readings from Last 3 Encounters:  02/01/19 208 lb (94.3 kg)  01/14/19 210 lb 12.8 oz (95.6 kg)  08/17/18 203 lb (92.1 kg)     Objective:    Vital Signs:  BP 139/82   Pulse 68   Ht 5\' 3"  (1.6 m)   Wt 208 lb (94.3 kg)   BMI 36.85 kg/m    VITAL SIGNS:  reviewed GEN:  no acute distress RESPIRATORY:  normal respiratory effort, symmetric expansion PSYCH:  normal affect exam limited by video format  ASSESSMENT & PLAN:    1. CAD: No angina. Continue aggressive secondary prevention.  2. HTN: BPs better after spironolactone.  BP tends to be higher in the evening. Still tired.  Will try to stop Clonidine.  She will wean it off to BID and then QD.  Arthritis limits her walking.  She will let us know if BP increases with stopping clonodine.  Limit salt intake.  3. Headache: thought to be related to BP.  4. Husband is now wearing his heart monitor  COVID-19 Education: The signs and symptoms of COVID-19 were discussed with the patient  and how to seek care for testing (follow up with PCP or arrange E-visit).  The importance of social distancing was discussed today.  Time:   Today, I have spent 15 minutes with the patient with telehealth technology discussing the above problems.     Medication Adjustments/Labs and Tests Ordered: Current medicines are reviewed at length with the patient today.  Concerns regarding medicines are outlined above.   Tests Ordered: No orders of the defined types were placed in this encounter.   Medication Changes: No orders of the defined types were placed in this encounter.   Follow Up:  RTC in 1 year(s)  Signed, Larae Grooms, MD  02/01/2019 12:05 PM    [facility]\

## 2019-02-01 ENCOUNTER — Encounter: Payer: Self-pay | Admitting: Interventional Cardiology

## 2019-02-01 ENCOUNTER — Telehealth (INDEPENDENT_AMBULATORY_CARE_PROVIDER_SITE_OTHER): Payer: Medicare Other | Admitting: Interventional Cardiology

## 2019-02-01 ENCOUNTER — Other Ambulatory Visit: Payer: Self-pay

## 2019-02-01 VITALS — BP 139/82 | HR 68 | Ht 63.0 in | Wt 208.0 lb

## 2019-02-01 DIAGNOSIS — I2583 Coronary atherosclerosis due to lipid rich plaque: Secondary | ICD-10-CM

## 2019-02-01 DIAGNOSIS — I10 Essential (primary) hypertension: Secondary | ICD-10-CM | POA: Diagnosis not present

## 2019-02-01 DIAGNOSIS — I2510 Atherosclerotic heart disease of native coronary artery without angina pectoris: Secondary | ICD-10-CM

## 2019-02-01 NOTE — Patient Instructions (Signed)
Medication Instructions:  Your physician recommends that you continue on your current medications as directed. Please refer to the Current Medication list given to you today.  *If you need a refill on your cardiac medications before your next appointment, please call your pharmacy*  Lab Work: None ordered If you have labs (blood work) drawn today and your tests are completely normal, you will receive your results only by: Marland Kitchen MyChart Message (if you have MyChart) OR . A paper copy in the mail If you have any lab test that is abnormal or we need to change your treatment, we will call you to review the results.  Testing/Procedures: None ordered  Follow-Up: At [facility], you and your health needs are our priority.  As part of our continuing mission to provide you with exceptional heart care, we have created designated Provider Care Teams.  These Care Teams include your primary Cardiologist (physician) and Advanced Practice Providers (APPs -  Physician Assistants and Nurse Practitioners) who all work together to provide you with the care you need, when you need it.  Your next appointment:   12 months  The format for your next appointment:   In Person  Provider:   You may see Rebecca Grooms, MD or one of the following Advanced Practice Providers on your designated Care Team:    Melina Copa, PA-C  Ermalinda Barrios, PA-C   Other Instructions  [name]

## 2019-02-28 ENCOUNTER — Other Ambulatory Visit: Payer: Self-pay | Admitting: Interventional Cardiology

## 2019-02-28 ENCOUNTER — Other Ambulatory Visit: Payer: Self-pay | Admitting: Pharmacist

## 2019-02-28 MED ORDER — SPIRONOLACTONE 25 MG PO TABS: 25.0000 mg | ORAL_TABLET | Freq: Every day | ORAL | 3 refills | Status: DC

## 2019-02-28 MED ORDER — ELIQUIS 5 MG PO TABS: 5.0000 mg | ORAL_TABLET | Freq: Two times a day (BID) | ORAL | 1 refills | Status: DC

## 2019-02-28 NOTE — Progress Notes (Signed)
Age 73, weight 94kg, SCr 1.14 on 01/24/19, afib indication (07/13/18 phone note), last OV 01/2019

## 2019-03-04 ENCOUNTER — Other Ambulatory Visit: Payer: Self-pay | Admitting: Interventional Cardiology

## 2019-03-04 MED ORDER — ELIQUIS 5 MG PO TABS: 5.0000 mg | ORAL_TABLET | Freq: Two times a day (BID) | ORAL | 0 refills | Status: DC

## 2019-03-04 NOTE — Telephone Encounter (Signed)
Ppt would like for her medication to be sent to a local pharmacy. Please resend.

## 2019-03-04 NOTE — Telephone Encounter (Signed)
Prescription refill request for Eliquis received.  Last office visit: 01/14/2019, Irish Lack Scr: 1.14, 01/24/2019 Age: 73 y.o. Weight: 102.5 kg  Prescription refill sent.

## 2019-03-29 ENCOUNTER — Ambulatory Visit: Payer: Medicare Other | Admitting: Podiatry

## 2019-03-29 ENCOUNTER — Other Ambulatory Visit: Payer: Self-pay

## 2019-03-29 ENCOUNTER — Encounter: Payer: Self-pay | Admitting: Podiatry

## 2019-03-29 DIAGNOSIS — M79675 Pain in left toe(s): Secondary | ICD-10-CM | POA: Diagnosis not present

## 2019-03-29 DIAGNOSIS — M79674 Pain in right toe(s): Secondary | ICD-10-CM | POA: Diagnosis not present

## 2019-03-29 DIAGNOSIS — B351 Tinea unguium: Secondary | ICD-10-CM

## 2019-03-29 DIAGNOSIS — M19072 Primary osteoarthritis, left ankle and foot: Secondary | ICD-10-CM

## 2019-03-30 ENCOUNTER — Telehealth: Payer: Self-pay | Admitting: Podiatry

## 2019-03-30 NOTE — Telephone Encounter (Signed)
Called pt to let her know that Nira Conn, Dr. Heber [place] assistant informed me she wanted her x-rays from July sent to her PCP, Dr. Drema Dallas. I told the pt she would need to fill out and sign a medical records release form. Pt asked me to e-mail the form to her at [email or web address].

## 2019-04-06 ENCOUNTER — Ambulatory Visit: Payer: Medicare Other | Admitting: Podiatry

## 2019-04-10 NOTE — Progress Notes (Signed)
Subjective:  [name] presents to clinic today with cc of  painful, thick, discolored, elongated toenails  of both feet that become tender and patient cannot cut because of thickness. Pain is aggravated when wearing enclosed shoe gear and relieved with periodic professional debridement.  She also relates her plantar fascial brace works for her right foot. She states her arthritis is flaring up. She feels pain cream does not work or her arthritis.   Patient voices no new pedal concerns on today's visit.  Medications reviewed in chart.  Allergies  Allergen Reactions  . Azithromycin Swelling  . Other Itching, Swelling, Other (See Comments) and Cough    Horse products  . Penicillins Swelling    Has patient had a PCN reaction causing immediate rash, facial/tongue/throat swelling, SOB or lightheadedness with hypotension: Yes Has patient had a PCN reaction causing severe rash involving mucus membranes or skin necrosis: No Has patient had a PCN reaction that required hospitalization: No Has patient had a PCN reaction occurring within the last 10 years: No If all of the above answers are "NO", then may proceed with Cephalosporin use.   . Tetanus Toxoids Swelling  . Gadolinium Derivatives Hives and Itching    Pt stated that her left arm was itching and I noticed that she had two hives on her chest. No difficulty breathing, sneezing. One 25mg  of benadryl was ordered by Dr. Jimmye Norman. Pt remained at facility and was monitored before going home. -ldrake  . Morphine And Related Nausea And Vomiting  . Latex   . Augmentin [Amoxicillin-Pot Clavulanate] Rash    Has patient had a PCN reaction causing immediate rash, facial/tongue/throat swelling, SOB or lightheadedness with hypotension: Yes Has patient had a PCN reaction causing severe rash involving mucus membranes or skin necrosis: Yes Has patient had a PCN reaction that required hospitalization:No Has patient had a PCN reaction occurring within  the last 10 years: No If all of the above answers are "NO", then may proceed with Cephalosporin use.    Objective:  Physical Examination:  Vascular Examination: Capillary refill time to digits immediate b/l.  Palpable DP/PT pulses b/l.  Sparse digital hair b/l.  No edema noted b/l.  Skin temperature gradient WNL b/l.  Dermatological Examination: Skin with normal turgor, texture and tone b/l.  No open wounds b/l.  No interdigital macerations noted b/l.  Elongated, thick, discolored brittle toenails with subungual debris and pain on dorsal palpation of nailbeds 1-5 b/l.  Musculoskeletal Examination: Muscle strength 5/5 to all muscle groups b/l.  No pain, crepitus or joint discomfort with active/passive ROM.  Neurological Examination: Sensation intact 5/5 b/l with 10 gram monofilament.  Vibratory sensation intact b/l.  Proprioceptive sensation intact b/l.  Xrays left foot, July 2020:  DJD noted dorsal naviculocuneiform joint. +Calcaneal spur  Assessment: Mycotic nail infection with pain 1-5 b/l OA/DJD left foot  Plan: 1. Toenails 1-5 b/l were debrided in length and girth without iatrogenic laceration. 2. Start Voltaren Gel and apply once in the morning and once in the evening. If no improvement in symptoms, may refer to Rheumatology.  3. Continue soft, supportive shoe gear daily. 4. Report any pedal injuries to medical professional. 5. Follow up 9 weeks. 6. Patient/POA to call should there be a question/concern in there interim.

## 2019-04-12 ENCOUNTER — Other Ambulatory Visit: Payer: Self-pay | Admitting: Interventional Cardiology

## 2019-04-29 ENCOUNTER — Ambulatory Visit: Payer: Medicare Other | Attending: Internal Medicine

## 2019-04-29 DIAGNOSIS — Z23 Encounter for immunization: Secondary | ICD-10-CM

## 2019-04-29 NOTE — Progress Notes (Signed)
   Covid-19 Vaccination Clinic  Name:  [name]    MRN: [id number] DOB: 29-Aug-1945  04/29/2019  [name] was observed post Covid-19 immunization for 15 minutes without incidence. She was provided with Vaccine Information Sheet and instruction to access the V-Safe system.   [name] was instructed to call 911 with any severe reactions post vaccine: Marland Kitchen Difficulty breathing  . Swelling of your face and throat  . A fast heartbeat  . A bad rash all over your body  . Dizziness and weakness    Immunizations Administered    Name Date Dose VIS Date Route   Pfizer COVID-19 Vaccine 04/29/2019 12:06 PM 0.3 mL 03/25/2019 Intramuscular   Manufacturer: [place]   Lot: [id number]   [place]: [id number]

## 2019-05-16 ENCOUNTER — Telehealth: Payer: Self-pay | Admitting: Interventional Cardiology

## 2019-05-16 NOTE — Telephone Encounter (Signed)
Pt c/o medication issue: 1. Name of Medication: Lasix 2. How are you currently taking this medication (dosage and times per day)? On time a day 3. Are you having a reaction (difficulty breathing--STAT)?  No  4. What is your medication issue? Patient.  seems to be sleeping 12 hours aday

## 2019-05-16 NOTE — Telephone Encounter (Signed)
Attempted to contact patient but there was no answer and VM did not pick up. 

## 2019-05-17 NOTE — Telephone Encounter (Signed)
Patient following up.

## 2019-05-18 NOTE — Telephone Encounter (Signed)
Left message to call back  

## 2019-05-18 NOTE — Telephone Encounter (Signed)
OK to try Lasix 20 mg 3-4 times/week and see how she feels. If swelling or SHOB increases, she should let us know.   JV

## 2019-05-18 NOTE — Telephone Encounter (Signed)
Follow up   Pt is calling back   Please call  

## 2019-05-18 NOTE — Telephone Encounter (Signed)
Called and spoke to patient. She states that she thinks that she is taking too much lasix. She states that for the past 3-4 months she has had the following Sx: - diarrhea within 1-2 hours after taking lasix - has been extremely fatigued - sleeping 14-15 hrs a day - rash under her armpits - SOB on exertion (walking from one end of the house to the other) - cough  She states that she thinks that these Sx are related to taking lasix. She states that she used to take it a few times a week but is now taking 20 mg QD. She is also taking k-dur 20 mEq QD, and spironolactone 25 mg QD. Last Cr- 1.0, K- 4.2 on 03/09/19. Patient states that she urinates frequently and that it is clear. She states that she has lost 25 pounds since April of last year. She denies having any chest pain, swelling, fever, or any other Sx. She has weaned herself off of the clonidine and her BPs have been 130s/70s HR 70s.   Made patient aware that I am not convinced that this is related to her lasix dose. Made patient aware that I will forward to Dr. Irish Lack for review and recommendation.

## 2019-05-20 ENCOUNTER — Ambulatory Visit: Payer: Medicare Other | Attending: Internal Medicine

## 2019-05-20 ENCOUNTER — Telehealth: Payer: Self-pay | Admitting: Interventional Cardiology

## 2019-05-20 DIAGNOSIS — Z23 Encounter for immunization: Secondary | ICD-10-CM | POA: Insufficient documentation

## 2019-05-20 MED ORDER — FUROSEMIDE 20 MG PO TABS: 20.0000 mg | ORAL_TABLET | ORAL | 1 refills | Status: DC

## 2019-05-20 NOTE — Telephone Encounter (Signed)
See phone note from 05/16/19.

## 2019-05-20 NOTE — Telephone Encounter (Signed)
New Message    Pt is returning call for [name]   Please call back

## 2019-05-20 NOTE — Telephone Encounter (Signed)
Left message to call back  

## 2019-05-20 NOTE — Telephone Encounter (Signed)
Patient notified to decrease lasix to 3-4 times a week. Patient will let us know if her Sx change or worsen.

## 2019-05-20 NOTE — Addendum Note (Signed)
Addended by: Drue Novel I on: 05/20/2019 05:10 PM   Modules accepted: Orders

## 2019-05-20 NOTE — Progress Notes (Signed)
   Covid-19 Vaccination Clinic  Name:  VERALEE GREIL    MRN: [id number] DOB: [birth year]  05/20/2019  Ms. Gentis was observed post Covid-19 immunization for 15 minutes without incidence. She was provided with Vaccine Information Sheet and instruction to access the V-Safe system.   Ms. Thibodaux was instructed to call 911 with any severe reactions post vaccine: Marland Kitchen Difficulty breathing  . Swelling of your face and throat  . A fast heartbeat  . A bad rash all over your body  . Dizziness and weakness    Immunizations Administered    Name Date Dose VIS Date Route   Pfizer COVID-19 Vaccine 05/20/2019 10:54 AM 0.3 mL 03/25/2019 Intramuscular   Manufacturer: [place]   Lot: [id number]   [place]: [id number]

## 2019-05-23 ENCOUNTER — Other Ambulatory Visit: Payer: Self-pay | Admitting: *Deleted

## 2019-05-23 MED ORDER — ELIQUIS 5 MG PO TABS: 5.0000 mg | ORAL_TABLET | Freq: Two times a day (BID) | ORAL | 1 refills | Status: DC

## 2019-05-23 NOTE — Telephone Encounter (Signed)
Eliquis 5mg  refill request received, pt is 74 yrs old, weight-94.3kg, Crea-1.14 on 01/24/2019, Diagnosis- Afib per OV note by Dr. Irish Lack on 07/20/2018 and on 08/03/2018 note will continue for stroke prevention, and last seen by Dr. Irish Lack on 02/01/2019. Dose is appropriate based on dosing criteria. Will send in refill to requested pharmacy.

## 2019-05-30 ENCOUNTER — Other Ambulatory Visit: Payer: Self-pay | Admitting: Gastroenterology

## 2019-05-30 DIAGNOSIS — R1310 Dysphagia, unspecified: Secondary | ICD-10-CM

## 2019-06-01 ENCOUNTER — Ambulatory Visit
Admission: RE | Admit: 2019-06-01 | Discharge: 2019-06-01 | Disposition: A | Discharge status: Home / Self Care | Payer: Medicare Other | Source: Ambulatory Visit | Attending: Gastroenterology | Admitting: Gastroenterology

## 2019-06-01 DIAGNOSIS — R1310 Dysphagia, unspecified: Secondary | ICD-10-CM

## 2019-06-02 ENCOUNTER — Other Ambulatory Visit: Payer: Medicare Other

## 2019-06-10 ENCOUNTER — Ambulatory Visit: Payer: Medicare Other | Admitting: Podiatry

## 2019-06-15 ENCOUNTER — Other Ambulatory Visit: Payer: Self-pay | Admitting: *Deleted

## 2019-06-15 MED ORDER — ISOSORBIDE MONONITRATE ER 60 MG PO TB24: 60.0000 mg | ORAL_TABLET | Freq: Every day | ORAL | 3 refills | Status: DC

## 2019-06-15 MED ORDER — LISINOPRIL 40 MG PO TABS: 40.0000 mg | ORAL_TABLET | Freq: Every day | ORAL | 3 refills | Status: DC

## 2019-06-15 MED ORDER — SPIRONOLACTONE 25 MG PO TABS: 25.0000 mg | ORAL_TABLET | Freq: Every day | ORAL | 3 refills | Status: DC

## 2019-06-21 ENCOUNTER — Telehealth: Payer: Self-pay | Admitting: Interventional Cardiology

## 2019-06-21 NOTE — Telephone Encounter (Signed)
Pt c/o medication issue:  1. Name of Medication: furosemide (LASIX) 20 MG tablet  2. How are you currently taking this medication (dosage and times per day)? As directed  3. Are you having a reaction (difficulty breathing--STAT)? no  4. What is your medication issue? Patient states that she is in [place] and she will be there for 2 months. She wants to know if she can stop taking the lasix since it is very dry out there and she does not feel like she is retaining any fluid.

## 2019-06-21 NOTE — Telephone Encounter (Signed)
Left message to call back  

## 2019-06-22 NOTE — Telephone Encounter (Signed)
Called and spoke to patient. She states that she is in [place] for the next 2 months. She states that it is very dry there and that she wants to just take her lasix prn. She denies having any SOB, swelling, weight gain, or any other Sx. Right now that patient has only been taking it 3x/week. Made patient aware that she can take prn and to continue to monitor for Sx of fluid retention (ie. SOB, swelling, weight gain, etc.). She verbalized understanding. Will forward to Dr. Irish Lack for review. Made patient aware that we will call her back if there are any other recommendations.

## 2019-06-24 NOTE — Telephone Encounter (Signed)
I agree with prn Lasix

## 2019-08-03 ENCOUNTER — Other Ambulatory Visit: Payer: Self-pay | Admitting: Interventional Cardiology

## 2019-08-16 ENCOUNTER — Other Ambulatory Visit: Payer: Self-pay

## 2019-08-16 ENCOUNTER — Encounter: Payer: Self-pay | Admitting: Podiatry

## 2019-08-16 ENCOUNTER — Ambulatory Visit: Payer: Medicare Other | Admitting: Podiatry

## 2019-08-16 DIAGNOSIS — L600 Ingrowing nail: Secondary | ICD-10-CM | POA: Diagnosis not present

## 2019-08-16 DIAGNOSIS — B351 Tinea unguium: Secondary | ICD-10-CM

## 2019-08-16 DIAGNOSIS — M79675 Pain in left toe(s): Secondary | ICD-10-CM | POA: Diagnosis not present

## 2019-08-16 DIAGNOSIS — M79674 Pain in right toe(s): Secondary | ICD-10-CM | POA: Diagnosis not present

## 2019-08-16 NOTE — Patient Instructions (Addendum)
EPSOM SALT FOOT SOAK INSTRUCTIONS  Shopping List:  A. Plain epsom salt (not scented) B. Neosporin Cream/Ointment or Bacitracin Cream/Ointment C. 1-inch fabric band-aids   1.  Place 1/4 cup of epsom salts in 2 quarts of warm tap water. IF YOU ARE DIABETIC, OR HAVE NEUROPATHY, CHECK THE TEMPERATURE OF THE WATER WITH YOUR ELBOW.  2.  Submerge your foot/feet in the solution and soak for 10-15 minutes.      3.  Next, remove your foot or feet from solution, blot dry the affected area.    4.  Apply antibiotic ointment and cover with fabric band-aid .  5.  This soak should be done once a day for 7 days.   6.  Monitor for any signs/symptoms of infection such as redness, swelling, odor, drainage, increased pain, or non-healing of digit.   7.  Please do not hesitate to call the office and speak to a Nurse or Doctor if you have questions.   8.  If you experience fever, chills, nightsweats, nausea or vomiting with worsening of digit, please go to the emergency room.     Arthritis Arthritis is a term that is commonly used to refer to joint pain or joint disease. There are more than 100 types of arthritis. What are the causes? The most common cause of this condition is wear and tear of a joint. Other causes include:  Gout.  Inflammation of a joint.  An infection of a joint.  Sprains and other injuries near the joint.  A reaction to medicines or drugs, or an allergic reaction. In some cases, the cause may not be known. What are the signs or symptoms? The main symptom of this condition is pain in the joint during movement. Other symptoms include:  Redness, swelling, or stiffness at a joint.  Warmth coming from the joint.  Fever.  Overall feeling of illness. How is this diagnosed? This condition may be diagnosed with a physical exam and tests, including:  Blood tests.  Urine tests.  Imaging tests, such as X-rays, an MRI, or a CT scan. Sometimes, fluid is removed from a joint  for testing. How is this treated? This condition may be treated with:  Treatment of the cause, if it is known.  Rest.  Raising (elevating) the joint.  Applying cold or hot packs to the joint.  Medicines to improve symptoms and reduce inflammation.  Injections of a steroid such as cortisone into the joint to help reduce pain and inflammation. Depending on the cause of your arthritis, you may need to make lifestyle changes to reduce stress on your joint. Changes may include:  Exercising more.  Losing weight. Follow these instructions at home: Medicines  Take over-the-counter and prescription medicines only as told by your health care provider.  Do not take aspirin to relieve pain if your health care provider thinks that gout may be causing your pain. Activity  Rest your joint if told by your health care provider. Rest is important when your disease is active and your joint feels painful, swollen, or stiff.  Avoid activities that make the pain worse. It is important to balance activity with rest.  Exercise your joint regularly with range-of-motion exercises as told by your health care provider. Try doing low-impact exercise, such as: ? Swimming. ? Water aerobics. ? Biking. ? Walking. Managing pain, stiffness, and swelling      If directed, put ice on the joint. ? Put ice in a plastic bag. ? Place a towel between your  skin and the bag. ? Leave the ice on for 20 minutes, 2-3 times per day.  If your joint is swollen, raise (elevate) it above the level of your heart if directed by your health care provider.  If your joint feels stiff in the morning, try taking a warm shower.  If directed, apply heat to the affected area as often as told by your health care provider. Use the heat source that your health care provider recommends, such as a moist heat pack or a heating pad. If you have diabetes, do not apply heat without permission from your health care provider. To apply  heat: ? Place a towel between your skin and the heat source. ? Leave the heat on for 20-30 minutes. ? Remove the heat if your skin turns bright red. This is especially important if you are unable to feel pain, heat, or cold. You may have a greater risk of getting burned. General instructions  Do not use any products that contain nicotine or tobacco, such as cigarettes, e-cigarettes, and chewing tobacco. If you need help quitting, ask your health care provider.  Keep all follow-up visits as told by your health care provider. This is important. Contact a health care provider if:  The pain gets worse.  You have a fever. Get help right away if:  You develop severe joint pain, swelling, or redness.  Many joints become painful and swollen.  You develop severe back pain.  You develop severe weakness in your leg.  You cannot control your bladder or bowels. Summary  Arthritis is a term that is commonly used to refer to joint pain or joint disease. There are more than 100 types of arthritis.  The most common cause of this condition is wear and tear of a joint. Other causes include gout, inflammation or infection of the joint, sprains, or allergies.  Symptoms of this condition include redness, swelling, or stiffness of the joint. Other symptoms include warmth, fever, or feeling ill.  This condition is treated with rest, elevation, medicines, and applying cold or hot packs.  Follow your health care provider's instructions about medicines, activity, exercises, and other home care treatments. This information is not intended to replace advice given to you by your health care provider. Make sure you discuss any questions you have with your health care provider. Document Revised: 03/08/2018 Document Reviewed: 03/08/2018 Elsevier Patient Education  2020 [employer].

## 2019-08-18 NOTE — Progress Notes (Signed)
Subjective: JYNESSA BASTIEN presents today for follow up of painful mycotic nails b/l that are difficult to trim. Pain interferes with ambulation. Aggravating factors include wearing enclosed shoe gear. Pain is relieved with periodic professional debridement.   In regards to her OA pain, she states she is now taking two 600 mg Tylenol three times daily. She states this helps her knee pain.   Allergies  Allergen Reactions  . Azithromycin Swelling  . Other Itching, Swelling, Other (See Comments) and Cough    Horse products  . Penicillins Swelling    Has patient had a PCN reaction causing immediate rash, facial/tongue/throat swelling, SOB or lightheadedness with hypotension: Yes Has patient had a PCN reaction causing severe rash involving mucus membranes or skin necrosis: No Has patient had a PCN reaction that required hospitalization: No Has patient had a PCN reaction occurring within the last 10 years: No If all of the above answers are "NO", then may proceed with Cephalosporin use.   . Tetanus Toxoids Swelling  . Gadolinium Derivatives Hives and Itching    Pt stated that her left arm was itching and I noticed that she had two hives on her chest. No difficulty breathing, sneezing. One 25mg  of benadryl was ordered by Dr. Jimmye Norman. Pt remained at facility and was monitored before going home. -ldrake  . Morphine And Related Nausea And Vomiting  . Latex   . Augmentin [Amoxicillin-Pot Clavulanate] Rash    Has patient had a PCN reaction causing immediate rash, facial/tongue/throat swelling, SOB or lightheadedness with hypotension: Yes Has patient had a PCN reaction causing severe rash involving mucus membranes or skin necrosis: Yes Has patient had a PCN reaction that required hospitalization:No Has patient had a PCN reaction occurring within the last 10 years: No If all of the above answers are "NO", then may proceed with Cephalosporin use.      Objective: There were no vitals filed for this  visit.  Pt 74 y.o. year old female  in NAD. AAO x 3.   Vascular Examination:  Capillary refill time to digits immediate b/l. Palpable DP pulses b/l. Palpable PT pulses b/l. Pedal hair sparse b/l. Skin temperature gradient within normal limits b/l. No edema noted b/l. No pain with calf compression b/l.  Dermatological Examination: Pedal skin with normal turgor, texture and tone bilaterally. No open wounds bilaterally. No interdigital macerations bilaterally. Toenails 1-5 b/l elongated, dystrophic, thickened, crumbly with subungual debris and tenderness to dorsal palpation.   Incurvated nailplate left great toe b/l border(s) with tenderness to palpation. No erythema, no edema, no drainage noted.  Musculoskeletal: Normal muscle strength 5/5 to all lower extremity muscle groups bilaterally. No gross bony deformities bilaterally. No pain crepitus or joint limitation noted with ROM b/l.  Neurological: Protective sensation intact 5/5 intact bilaterally with 10g monofilament b/l. Vibratory sensation intact b/l.  Assessment: No diagnosis found.  Plan: -Toenails 2-5 bilaterally and R hallux debrided in length and girth without iatrogenic bleeding with sterile nail nipper and dremel.  -Advised Mrs. Ferch to inform her PCP of Tylenol usage. Discussed risks of Tylenol as related to liver toxicity/damage. -Pt unable to tolerate debridement left hallux without local anesthetic. 3 cc's of 50/50 mix of 2% xylocaine plain and 0.5% marcaine plain administered to left hallux. Slantback procedure b/l borders left hallux. Offending nail border debrided and curretaged left hallux. Lumicaine hemostatic solution used to address light bleeding.  Border cleansed with alcohol and triple antibiotic applied. Instructions for epsom salt soaks dispensed. -Patient to continue soft, supportive  shoe gear daily. -Patient to report any pedal injuries to medical professional immediately. -Patient/POA to call should there be  question/concern in the interim.  Return in about 9 weeks (around 10/18/2019) for nail trim/ Eliquis.  Marzetta Board, DPM

## 2019-08-22 ENCOUNTER — Other Ambulatory Visit: Payer: Self-pay

## 2019-08-22 MED ORDER — NITROGLYCERIN 0.4 MG SL SUBL: SUBLINGUAL_TABLET | SUBLINGUAL | 1 refills | Status: DC

## 2019-08-24 ENCOUNTER — Other Ambulatory Visit: Payer: Self-pay

## 2019-08-24 MED ORDER — LABETALOL HCL 200 MG PO TABS: 200.0000 mg | ORAL_TABLET | Freq: Two times a day (BID) | ORAL | 1 refills | Status: DC

## 2019-08-26 ENCOUNTER — Other Ambulatory Visit: Payer: Self-pay

## 2019-08-31 ENCOUNTER — Other Ambulatory Visit: Payer: Self-pay

## 2019-08-31 MED ORDER — ATORVASTATIN CALCIUM 20 MG PO TABS: 20.0000 mg | ORAL_TABLET | Freq: Every day | ORAL | 0 refills | Status: DC

## 2019-08-31 MED ORDER — ATORVASTATIN CALCIUM 20 MG PO TABS: 20.0000 mg | ORAL_TABLET | Freq: Every day | ORAL | 1 refills | Status: DC

## 2019-08-31 NOTE — Telephone Encounter (Signed)
Pt's medication Atorvastatin 20 mg tablets were sent to pt's local pharmacy for a short supply and also sent to [facility] mail order pharmacy. Pt stated that she now takes 20 mg tablets and not 10 mg tablets. Confirmation received.

## 2019-10-06 ENCOUNTER — Ambulatory Visit: Payer: Self-pay | Admitting: Adult Health

## 2019-10-07 ENCOUNTER — Other Ambulatory Visit: Payer: Self-pay | Admitting: Interventional Cardiology

## 2019-10-10 NOTE — Telephone Encounter (Signed)
Pt last saw Dr Irish Lack 02/01/19 telemedicine Covid-19, last labs 03/09/19 Creat 1.0, age 74, weight 94.3kg,based on specified criteria pt is on appropriate dosage of Eliquis 5mg  BID.  Will refill rx.

## 2019-10-23 ENCOUNTER — Other Ambulatory Visit: Payer: Self-pay | Admitting: Interventional Cardiology

## 2019-10-25 ENCOUNTER — Other Ambulatory Visit: Payer: Self-pay

## 2019-10-25 ENCOUNTER — Ambulatory Visit: Payer: Medicare Other | Admitting: Podiatry

## 2019-10-25 ENCOUNTER — Encounter: Payer: Self-pay | Admitting: Podiatry

## 2019-10-25 DIAGNOSIS — M79674 Pain in right toe(s): Secondary | ICD-10-CM

## 2019-10-25 DIAGNOSIS — M79675 Pain in left toe(s): Secondary | ICD-10-CM

## 2019-10-25 DIAGNOSIS — B351 Tinea unguium: Secondary | ICD-10-CM

## 2019-10-29 NOTE — Progress Notes (Signed)
Subjective: [name] presents today for follow up of painful mycotic nails b/l that are difficult to trim. Pain interferes with ambulation. Aggravating factors include wearing enclosed shoe gear. Pain is relieved with periodic professional debridement.   She notes no new pedal concerns on today's visit.  Allergies  Allergen Reactions  . Azithromycin Swelling  . Other Itching, Swelling, Other (See Comments) and Cough    Horse products  . Penicillins Swelling    Has patient had a PCN reaction causing immediate rash, facial/tongue/throat swelling, SOB or lightheadedness with hypotension: Yes Has patient had a PCN reaction causing severe rash involving mucus membranes or skin necrosis: No Has patient had a PCN reaction that required hospitalization: No Has patient had a PCN reaction occurring within the last 10 years: No If all of the above answers are "NO", then may proceed with Cephalosporin use.   . Tetanus Toxoids Swelling  . Gadolinium Derivatives Hives and Itching    Pt stated that her left arm was itching and I noticed that she had two hives on her chest. No difficulty breathing, sneezing. One 25mg  of benadryl was ordered by Dr. Jimmye Norman. Pt remained at facility and was monitored before going home. -ldrake  . Morphine And Related Nausea And Vomiting  . Latex   . Augmentin [Amoxicillin-Pot Clavulanate] Rash    Has patient had a PCN reaction causing immediate rash, facial/tongue/throat swelling, SOB or lightheadedness with hypotension: Yes Has patient had a PCN reaction causing severe rash involving mucus membranes or skin necrosis: Yes Has patient had a PCN reaction that required hospitalization:No Has patient had a PCN reaction occurring within the last 10 years: No If all of the above answers are "NO", then may proceed with Cephalosporin use.      Objective: There were no vitals filed for this visit.  Pt is a pleasant  74 y.o. year old Caucasian female in NAD. AAO x 3.    Vascular Examination:  Capillary refill time to digits immediate b/l. Palpable DP pulses b/l. Palpable PT pulses b/l. Pedal hair sparse b/l. Skin temperature gradient within normal limits b/l. No edema noted b/l. No pain with calf compression b/l.  Dermatological Examination: Pedal skin with normal turgor, texture and tone bilaterally. No open wounds bilaterally. No interdigital macerations bilaterally. Toenails 1-5 b/l elongated, dystrophic, thickened, crumbly with subungual debris and tenderness to dorsal palpation.   Incurvated nailplate left great toe b/l border(s) with tenderness to palpation. No erythema, no edema, no drainage noted.  Musculoskeletal: Normal muscle strength 5/5 to all lower extremity muscle groups bilaterally. No gross bony deformities bilaterally. No pain crepitus or joint limitation noted with ROM b/l.  Neurological: Protective sensation intact 5/5 intact bilaterally with 10g monofilament b/l. Vibratory sensation intact b/l.  Assessment: 1. Pain due to onychomycosis of toenails of both feet    Plan: -Toenails 1-5 bilaterally and R hallux debrided in length and girth without iatrogenic bleeding with sterile nail nipper and dremel.  -Patient to continue soft, supportive shoe gear daily. -Patient to report any pedal injuries to medical professional immediately. -Patient/POA to call should there be question/concern in the interim.  Return in about 9 weeks (around 12/27/2019) for nail trim/ Eliquis.  Marzetta Board, DPM

## 2019-10-31 ENCOUNTER — Telehealth: Payer: Self-pay | Admitting: Interventional Cardiology

## 2019-10-31 ENCOUNTER — Other Ambulatory Visit: Payer: Self-pay

## 2019-10-31 MED ORDER — AMLODIPINE BESYLATE 10 MG PO TABS: 10.0000 mg | ORAL_TABLET | Freq: Every day | ORAL | 0 refills | Status: DC

## 2019-10-31 NOTE — Telephone Encounter (Signed)
Called and spoke to patient. She states that she has been paying $115 for a 90 day supply of Eliquis and now has to pay $465 for a 90 day supply. She states that she thinks that she is in the donut hole. The patient states that she can afford to pay the amount required but would prefer not to. Made patient aware that in order to get out of the donut hole she will likely need to pay the amount required. Patient is asking if there is any other assistance. Gave the patient the number to [facility], but made her aware that she will still likely need to pay. She verbalized understanding and thanked me for the call.

## 2019-10-31 NOTE — Telephone Encounter (Signed)
Will send call to our medication program assistance nurse Jeani Hawking as well as an FYI to the primary card nurse.

## 2019-10-31 NOTE — Telephone Encounter (Signed)
The pt states that she is calling [facility] now to discuss her eligibility to be acceptedinto their program and to ask that they mail her an application.  She is aware to complete the application once she receives it, obtain required documents per [facility] program and to bring all to the office and that we will take care of the provider part and will fax all to [facility] program.  She thanked me for calling her as well as Dr Hassell Done nurse to discuss options.

## 2019-10-31 NOTE — Telephone Encounter (Signed)
New Message   Pt c/o medication issue:  1. Name of Medication: ELIQUIS 5 MG TABS tablet   2. How are you currently taking this medication (dosage and times per day)? TAKE 1 TABLET BY MOUTH TWICE DAILY  3. Are you having a reaction (difficulty breathing--STAT)?   4. What is your medication issue? Patient is calling to see about getting some patient assistance with the cost of the prescription. She is in the doughnut hole and it will cost her $465. Please call to discuss.

## 2019-11-15 ENCOUNTER — Telehealth: Payer: Self-pay | Admitting: Interventional Cardiology

## 2019-11-15 MED ORDER — LABETALOL HCL 200 MG PO TABS: 200.0000 mg | ORAL_TABLET | Freq: Two times a day (BID) | ORAL | 0 refills | Status: DC

## 2019-11-15 MED ORDER — ATORVASTATIN CALCIUM 20 MG PO TABS: 20.0000 mg | ORAL_TABLET | Freq: Every day | ORAL | 0 refills | Status: DC

## 2019-11-15 MED ORDER — AMLODIPINE BESYLATE 10 MG PO TABS: 10.0000 mg | ORAL_TABLET | Freq: Every day | ORAL | 0 refills | Status: DC

## 2019-11-15 NOTE — Telephone Encounter (Signed)
*  STAT* If patient is at the pharmacy, call can be transferred to refill team.   1. Which medications need to be refilled? (please list name of each medication and dose if known) atorvastatin (LIPITOR) 20 MG tablet, labetalol (NORMODYNE) 200 MG tablet   2. Which pharmacy/location (including street and city if local pharmacy) is medication to be sent to? [place], [address] [employer], [address]  3. Do they need a 30 day or 90 day supply? [place]

## 2019-11-15 NOTE — Telephone Encounter (Signed)
Pt c/o medication issue:  1. Name of Medication:amLODipine (NORVASC) 10 MG tablet,   2. How are you currently taking this medication (dosage and times per day)? As written  3. Are you having a reaction (difficulty breathing--STAT)? No   4. What is your medication issue? Pt currently is out of refills. Needs a new prescription sent by the doctor

## 2019-11-15 NOTE — Telephone Encounter (Signed)
Returned call to patient to discuss samples/patient assistance. There is a phone note in her chart from 10/31/19 that she spoke with Jeani Hawking Via, LPN regarding patient assistance. She did not answer and I left msg on her voicemail for her to return call to the office.

## 2019-11-15 NOTE — Telephone Encounter (Signed)
Called patient about her refill. Scheduled patient for OV with Dr. Irish Lack in October and refilled medication up to her next appointment. While on the phone patient requested samples of eliquis. Patient stated she was in the donut hole and did not qualify for patient assistance. Will forward to refills department to see if samples are available.

## 2019-11-17 NOTE — Telephone Encounter (Signed)
Wyonia Hough, LPN, this pt has requested samples of Eliquis. Can you please advise on this matter? Thanks

## 2019-11-17 NOTE — Telephone Encounter (Signed)
The pt states that she checked on pt asst and found that her annual income is greater than [employer] [facility]'s limit. She states that she is trying to stay on Eliquis and is hoping that if she pays the high price for her Eliquis one more time this year it will get her out of the donut hole. She is requesting samples just to help her out at this time as Eliquis is so expensive. I advised her that we can leaveher 2 boxes of Eliquis 5 mg samples in the front office for her to pick up. She expressed much gratitude for our help.

## 2019-11-17 NOTE — Telephone Encounter (Signed)
Will do, thanks

## 2019-11-17 NOTE — Telephone Encounter (Signed)
Patient is returning call.  °

## 2019-12-27 ENCOUNTER — Encounter: Payer: Self-pay | Admitting: Podiatry

## 2019-12-27 ENCOUNTER — Ambulatory Visit: Payer: Medicare Other | Admitting: Podiatry

## 2019-12-27 ENCOUNTER — Other Ambulatory Visit: Payer: Self-pay

## 2019-12-27 DIAGNOSIS — M79675 Pain in left toe(s): Secondary | ICD-10-CM

## 2019-12-27 DIAGNOSIS — M79674 Pain in right toe(s): Secondary | ICD-10-CM

## 2019-12-27 DIAGNOSIS — B351 Tinea unguium: Secondary | ICD-10-CM | POA: Diagnosis not present

## 2019-12-29 NOTE — Progress Notes (Signed)
Subjective: [name] presents today for follow up of painful mycotic nails b/l that are difficult to trim. Pain interferes with ambulation. Aggravating factors include wearing enclosed shoe gear. Pain is relieved with periodic professional debridement.   She notes no new pedal concerns on today's visit.  Allergies  Allergen Reactions  . Azithromycin Swelling  . Other Itching, Swelling, Other (See Comments) and Cough    Horse products  . Penicillins Swelling    Has patient had a PCN reaction causing immediate rash, facial/tongue/throat swelling, SOB or lightheadedness with hypotension: Yes Has patient had a PCN reaction causing severe rash involving mucus membranes or skin necrosis: No Has patient had a PCN reaction that required hospitalization: No Has patient had a PCN reaction occurring within the last 10 years: No If all of the above answers are "NO", then may proceed with Cephalosporin use.   . Tetanus Toxoids Swelling  . Gadolinium Derivatives Hives and Itching    Pt stated that her left arm was itching and I noticed that she had two hives on her chest. No difficulty breathing, sneezing. One 25mg  of benadryl was ordered by Dr. Jimmye Norman. Pt remained at facility and was monitored before going home. -[place]rake  . Morphine And Related Nausea And Vomiting  . Latex   . Augmentin [Amoxicillin-Pot Clavulanate] Rash    Has patient had a PCN reaction causing immediate rash, facial/tongue/throat swelling, SOB or lightheadedness with hypotension: Yes Has patient had a PCN reaction causing severe rash involving mucus membranes or skin necrosis: Yes Has patient had a PCN reaction that required hospitalization:No Has patient had a PCN reaction occurring within the last 10 years: No If all of the above answers are "NO", then may proceed with Cephalosporin use.      Objective: There were no vitals filed for this visit.  Pt is a pleasant  74 y.o. year old Caucasian female in NAD. AAO x 3.    Vascular Examination:  Capillary refill time to digits immediate b/l. Palpable DP pulses b/l. Palpable PT pulses b/l. Pedal hair sparse b/l. Skin temperature gradient within normal limits b/l. No edema noted b/l. No pain with calf compression b/l.  Dermatological Examination: Pedal skin with normal turgor, texture and tone bilaterally. No open wounds bilaterally. No interdigital macerations bilaterally. Toenails 1-5 b/l elongated, dystrophic, thickened, crumbly with subungual debris and tenderness to dorsal palpation.   Incurvated nailplate left great toe b/l border(s) with tenderness to palpation. No erythema, no edema, no drainage noted.  Musculoskeletal: Normal muscle strength 5/5 to all lower extremity muscle groups bilaterally. No gross bony deformities bilaterally. No pain crepitus or joint limitation noted with ROM b/l.  Neurological: Protective sensation intact 5/5 intact bilaterally with 10g monofilament b/l. Vibratory sensation intact b/l.  Assessment: 1. Pain due to onychomycosis of toenails of both feet     Plan: -No new findings. No new orders. -Toenails 1-5 bilaterally debrided in length and girth without iatrogenic bleeding with sterile nail nipper and dremel.  -Patient to continue soft, supportive shoe gear daily. -Patient to report any pedal injuries to medical professional immediately. -Patient/POA to call should there be question/concern in the interim.  Return in about 3 months (around 03/27/2020).  Marzetta Board, DPM

## 2020-01-31 NOTE — Progress Notes (Addendum)
Cardiology Office Note   Date:  02/01/2020   ID:  [name], [name] 07-Mar-1946, MRN [id number]  PCP:  Leighton Ruff, MD    No chief complaint on file.  CAD/HTN  Wt Readings from Last 3 Encounters:  02/01/20 204 lb (92.5 kg)  02/01/19 208 lb (94.3 kg)  01/14/19 210 lb 12.8 oz (95.6 kg)       History of Present Illness: [name] is a 74 y.o. female  withCAD. She had PCI in 1999 and 2002. She had a cath without obstructive disease in 2015.  She has had a chronic headache and is now seeing neuro.  She had acute diastolic heart failure after a trip to [place] and [place]. She returned in March 2020. She was tested for COVID and this was negative.  She was diuresed 20 lbs. She may have eaten more salt. Troponin was negative. BNP was elevated.   She was discharged in Lasix 20 mg daily, discharged from [facility] the end of March 2020.  She used Biotin for hair loss.  In 08/2018,Some high BP readings, up to the 160s.At last visit, plan was :Increase amlopdine to 10 mg daily.A few weeks later: "She will move amlodipine and lisinopril to evening. She will call in with readings. If too low in AM, move amlodipine back to AM."   At 01/14/2019 visit, plan was :"Add spironolactone 25 mg daily. Continue lasix daily. [facility] next week. Recheck in a few weeks. If spironolactone helps get BP to therapeutic range, would stop clonidine. Refill Lasix for daily use. "  SInce the last visit, she has done well.  Lost weight and kept it off.  Denies : Chest pain. Dizziness. Leg edema. Nitroglycerin use. Orthopnea. Palpitations. Paroxysmal nocturnal dyspnea. Shortness of breath. Syncope.   Knee pain limits activity.     Past Medical History:  Diagnosis Date  . Anxiety   . Arthritis   . Bone spur   . Coronary artery disease    stent - 1999  . Depression   . Fibrocystic breast changes   . GERD (gastroesophageal reflux disease)   .  Glucosuria   . Hernia, inguinal, left 10/2002  . Hypertension   . Melanoma ([place]) 10/2008   right shoulder and arm  . Menorrhagia   . Myocardial infarction ([place]) 1999    Past Surgical History:  Procedure Laterality Date  . ANGIOPLASTY  2000  . APPENDECTOMY    . bone chip removed from left foot     . CHOLECYSTECTOMY    . COLONOSCOPY WITH PROPOFOL N/A 03/03/2017   Procedure: COLONOSCOPY WITH PROPOFOL;  Surgeon: Juanita Craver, MD;  Location: [facility] ENDOSCOPY;  Service: Endoscopy;  Laterality: N/A;  . CORONARY ANGIOPLASTY WITH STENT PLACEMENT  10/99  . ESOPHAGOGASTRODUODENOSCOPY (EGD) WITH PROPOFOL N/A 03/03/2017   Procedure: ESOPHAGOGASTRODUODENOSCOPY (EGD) WITH PROPOFOL;  Surgeon: Juanita Craver, MD;  Location: [facility] ENDOSCOPY;  Service: Endoscopy;  Laterality: N/A;  . HERNIA REPAIR     umbilical   . HYSTEROSCOPY  2/98   D&C (polyps)  . LEFT HEART CATHETERIZATION WITH CORONARY ANGIOGRAM N/A 01/25/2014   Procedure: LEFT HEART CATHETERIZATION WITH CORONARY ANGIOGRAM;  Surgeon: Jettie Booze, MD;  Location: [facility] CATH LAB;  Service: Cardiovascular;  Laterality: N/A;  . NODE DISSECTION     neg  . TOTAL ABDOMINAL HYSTERECTOMY     LSO     Failed TVH     Current Outpatient Medications  Medication Sig Dispense Refill  . acetaminophen (TYLENOL 8 HOUR  ARTHRITIS PAIN) 650 MG CR tablet Take 1,300 mg every 8 (eight) hours as needed by mouth for pain.    Marland Kitchen amLODipine (NORVASC) 10 MG tablet Take 1 tablet (10 mg total) by mouth daily. 90 tablet 0  . atorvastatin (LIPITOR) 20 MG tablet Take 1 tablet (20 mg total) by mouth daily at 6 PM. 90 tablet 0  . BIOTIN PO Take by mouth.    Marland Kitchen CINNAMON PO Take by mouth.    Arne Cleveland 5 MG TABS tablet TAKE 1 TABLET BY MOUTH  TWICE DAILY 180 tablet 1  . furosemide (LASIX) 20 MG tablet Take 20 mg by mouth as needed.    . isosorbide mononitrate (IMDUR) 60 MG 24 hr tablet Take 1 tablet (60 mg total) by mouth daily. 90 tablet 3  . labetalol (NORMODYNE) 200 MG tablet Take  1 tablet (200 mg total) by mouth 2 (two) times daily. 180 tablet 0  . lisinopril (ZESTRIL) 40 MG tablet Take 1 tablet (40 mg total) by mouth daily. 90 tablet 3  . mupirocin ointment (BACTROBAN) 2 % Apply topically daily.    . nitroGLYCERIN (NITROSTAT) 0.4 MG SL tablet DISSOLVE 1 TABLET UNDER THE TONGUE EVERY 5 MINUTES AS  NEEDED FOR CHEST PAIN. MAX  OF 3 TABLETS IN 15 MINUTES. CALL 911 IF PAIN PERSISTS. 100 tablet 1  . NON FORMULARY Pain cream for arthritis sent to [facility], faxed 11/01/2018 HS-CMA    . pantoprazole (PROTONIX) 40 MG tablet Take 40 mg by mouth daily.    . potassium chloride SA (KLOR-CON) 20 MEQ tablet Take 20 mEq by mouth as needed.    . Soft Lens Products (REWETTING DROPS) SOLN Place 1 drop 3 (three) times daily as needed into both eyes (for dry/irritated contact lenses).    Marland Kitchen spironolactone (ALDACTONE) 25 MG tablet Take 1 tablet (25 mg total) by mouth daily. 90 tablet 3  . VITAMIN D PO Take 2,000 mg by mouth daily.    . Vitamin D, Ergocalciferol, (DRISDOL) 1.25 MG (50000 UT) CAPS capsule TAKE 1 CAPSULE BY MOUTH WEEKLY, START VIT D 2,000 UNITS DAILY AFTER 60 DAYS     No current facility-administered medications for this visit.    Allergies:   Azithromycin, Other, Penicillins, Tetanus toxoids, Gadolinium derivatives, Morphine and related, Latex, and Augmentin [amoxicillin-pot clavulanate]    Social History:  The patient  reports that she has never smoked. She has never used smokeless tobacco. She reports current alcohol use of about 1.0 standard drink of alcohol per week. She reports that she does not use drugs.   Family History:  The patient's family history includes Cancer in her father; Diabetes in her mother; Heart attack in her father; Heart disease in her father; Hypertension in her father and mother; Stroke in her mother.    ROS:  Please see the history of present illness.   Otherwise, review of systems are positive for Eliquis.   All other systems are reviewed  and negative.    PHYSICAL EXAM: VS:  BP 114/70   Pulse 63   Ht 5\' 3"  (1.6 m)   Wt 204 lb (92.5 kg)   SpO2 97%   BMI 36.14 kg/m  , BMI Body mass index is 36.14 kg/m. GEN: Well nourished, well developed, in no acute distress  HEENT: normal  Neck: no JVD, carotid bruits, or masses Cardiac: RRR; no murmurs, rubs, or gallops,no edema  Respiratory:  clear to auscultation bilaterally, normal work of breathing GI: soft, nontender, nondistended, + BS MS:  no deformity or atrophy  Skin: warm and dry, no rash Neuro:  Strength and sensation are intact Psych: euthymic mood, full affect   EKG:   The ekg ordered today demonstrates    Recent Labs: No results found for requested labs within last 8760 hours.   Lipid Panel No results found for: CHOL, TRIG, HDL, CHOLHDL, VLDL, LDLCALC, LDLDIRECT   Other studies Reviewed: Additional studies/ records that were reviewed today with results demonstrating: Labs from February 2021 reviewed.  LDL 79.  A1c 5.7.  Creatinine 0.84 in May 2021.   ASSESSMENT AND PLAN:  1. CAD: No angina.  Continue aggressive secondary prevention.  I encouraged her to exercise.  She is limited by knee pain.  I suggested water aerobics. 2. HTN: The current medical regimen is effective;  continue present plan and medications.  Well-controlled, particularly after recent weight loss. 3. Chronic diastolic heart failure: She appears euvolemic.  Low-salt diet. 4. Hyperlipidemia: Continue atorvastatin.      Current medicines are reviewed at length with the patient today.  The patient concerns regarding her medicines were addressed.  The following changes have been made:  No change  Labs/ tests ordered today include:   Orders Placed This Encounter  Procedures  . EKG 12-Lead    Recommend 150 minutes/week of aerobic exercise Low fat, low carb, high fiber diet recommended  Disposition:   FU in 1 year   Signed, Larae Grooms, MD  02/01/2020 2:12 PM    [facility] [place], [place], [place]  [address] Phone: [phone]; Fax: [phone]

## 2020-02-01 ENCOUNTER — Encounter: Payer: Self-pay | Admitting: Interventional Cardiology

## 2020-02-01 ENCOUNTER — Other Ambulatory Visit: Payer: Self-pay

## 2020-02-01 ENCOUNTER — Ambulatory Visit: Payer: Medicare Other | Admitting: Interventional Cardiology

## 2020-02-01 VITALS — BP 114/70 | HR 63 | Ht 63.0 in | Wt 204.0 lb

## 2020-02-01 DIAGNOSIS — I5032 Chronic diastolic (congestive) heart failure: Secondary | ICD-10-CM

## 2020-02-01 DIAGNOSIS — E782 Mixed hyperlipidemia: Secondary | ICD-10-CM | POA: Diagnosis not present

## 2020-02-01 DIAGNOSIS — I2510 Atherosclerotic heart disease of native coronary artery without angina pectoris: Secondary | ICD-10-CM

## 2020-02-01 DIAGNOSIS — I10 Essential (primary) hypertension: Secondary | ICD-10-CM | POA: Diagnosis not present

## 2020-02-01 DIAGNOSIS — I2583 Coronary atherosclerosis due to lipid rich plaque: Secondary | ICD-10-CM

## 2020-02-01 NOTE — Patient Instructions (Signed)
Medication Instructions:  Your physician recommends that you continue on your current medications as directed. Please refer to the Current Medication list given to you today.  *If you need a refill on your cardiac medications before your next appointment, please call your pharmacy*   Lab Work: None  If you have labs (blood work) drawn today and your tests are completely normal, you will receive your results only by: Marland Kitchen MyChart Message (if you have MyChart) OR . A paper copy in the mail If you have any lab test that is abnormal or we need to change your treatment, we will call you to review the results.   Testing/Procedures: None   Follow-Up: At [facility], you and your health needs are our priority.  As part of our continuing mission to provide you with exceptional heart care, we have created designated Provider Care Teams.  These Care Teams include your primary Cardiologist (physician) and Advanced Practice Providers (APPs -  Physician Assistants and Nurse Practitioners) who all work together to provide you with the care you need, when you need it.  We recommend signing up for the patient portal called "MyChart".  Sign up information is provided on this After Visit Summary.  MyChart is used to connect with patients for Virtual Visits (Telemedicine).  Patients are able to view lab/test results, encounter notes, upcoming appointments, etc.  Non-urgent messages can be sent to your provider as well.   To learn more about what you can do with MyChart, go to [email or web address].    Your next appointment:   12 day(s)  The format for your next appointment:   In Person  Provider:   You may see Larae Grooms, MD or one of the following Advanced Practice Providers on your designated Care Team:    Melina Copa, PA-C  Ermalinda Barrios, PA-C    Other Instructions None

## 2020-02-28 ENCOUNTER — Other Ambulatory Visit: Payer: Self-pay | Admitting: Interventional Cardiology

## 2020-03-06 ENCOUNTER — Other Ambulatory Visit: Payer: Self-pay | Admitting: Interventional Cardiology

## 2020-03-15 ENCOUNTER — Other Ambulatory Visit: Payer: Self-pay | Admitting: Interventional Cardiology

## 2020-03-17 ENCOUNTER — Other Ambulatory Visit: Payer: Self-pay | Admitting: Interventional Cardiology

## 2020-03-20 ENCOUNTER — Ambulatory Visit (INDEPENDENT_AMBULATORY_CARE_PROVIDER_SITE_OTHER): Payer: Medicare Other | Admitting: Podiatry

## 2020-03-20 ENCOUNTER — Encounter: Payer: Self-pay | Admitting: Podiatry

## 2020-03-20 ENCOUNTER — Other Ambulatory Visit: Payer: Self-pay

## 2020-03-20 DIAGNOSIS — M79674 Pain in right toe(s): Secondary | ICD-10-CM | POA: Diagnosis not present

## 2020-03-20 DIAGNOSIS — M79675 Pain in left toe(s): Secondary | ICD-10-CM | POA: Diagnosis not present

## 2020-03-20 DIAGNOSIS — B351 Tinea unguium: Secondary | ICD-10-CM | POA: Diagnosis not present

## 2020-03-20 NOTE — Patient Instructions (Signed)
EPSOM SALT FOOT SOAK INSTRUCTIONS  *IF YOU HAVE BEEN PRESCRIBED ANTIBIOTICS, TAKE AS INSTRUCTED UNTIL ALL ARE GONE*  Shopping List:  A. Plain epsom salt (not scented) B. Neosporin Cream/Ointment or Bacitracin Cream/Ointment (or prescribed antiobiotic drops/cream/ointment) C. 1-inch fabric band-aids  1.  Place 1/4 cup of epsom salts in 2 quarts of warm tap water. IF YOU ARE DIABETIC, OR HAVE NEUROPATHY, CHECK THE TEMPERATURE OF THE WATER WITH YOUR ELBOW.  2.  Submerge your foot/feet in the solution and soak for 10-15 minutes.      3.  Next, remove your foot/feet from solution, blot dry the affected area.    4.  Apply light amount of antibiotic cream/ointment and cover with fabric band-aid .  5.  This soak should be done once a day for 7 days.   6.  Monitor for any signs/symptoms of infection such as redness, swelling, odor, drainage, increased pain, or non-healing of digit.   7.  Please do not hesitate to call the office and speak to a Nurse or Doctor if you have questions.   8.  If you experience fever, chills, nightsweats, nausea or vomiting with worsening of digit, please go to the emergency room.   

## 2020-03-22 ENCOUNTER — Other Ambulatory Visit: Payer: Self-pay | Admitting: Interventional Cardiology

## 2020-03-23 NOTE — Telephone Encounter (Addendum)
Prescription refill request for Eliquis received.  Last office visit: Var[place], 02/01/2020 Scr: 0.95, 02/23/2020 Age: 74 yo Weight: 92.5 kg   Prescription refill sent.

## 2020-03-25 NOTE — Progress Notes (Signed)
Subjective: [name] is a pleasant 74 y.o. female patient seen today painful thick toenails that are difficult to trim. Pain interferes with ambulation. Aggravating factors include wearing enclosed shoe gear. Pain is relieved with periodic professional debridement.  Past Medical History:  Diagnosis Date  . Anxiety   . Arthritis   . Bone spur   . Coronary artery disease    stent - 1999  . Depression   . Fibrocystic breast changes   . GERD (gastroesophageal reflux disease)   . Glucosuria   . Hernia, inguinal, left 10/2002  . Hypertension   . Melanoma ([place]) 10/2008   right shoulder and arm  . Menorrhagia   . Myocardial infarction [facility]) 1999    Patient Active Problem List   Diagnosis Date Noted  . Acute respiratory disease due to COVID-19 virus 07/09/2018  . Coronary artery disease 07/09/2018  . Depression 07/09/2018  . Acute diastolic CHF (congestive heart failure) ([place]) 07/09/2018  . Suspected COVID-19 virus infection 07/09/2018  . Heart failure ([place]) 07/09/2018  . Pain in both feet 10/07/2016  . Daytime somnolence 08/30/2014  . Coronary atherosclerosis of native coronary artery 10/17/2013  . Mixed hyperlipidemia 10/17/2013  . Essential hypertension, benign 10/17/2013    Current Outpatient Medications on File Prior to Visit  Medication Sig Dispense Refill  . acetaminophen (TYLENOL 8 HOUR ARTHRITIS PAIN) 650 MG CR tablet Take 1,300 mg every 8 (eight) hours as needed by mouth for pain.    Marland Kitchen amLODipine (NORVASC) 10 MG tablet TAKE 1 TABLET BY MOUTH  DAILY 90 tablet 3  . atorvastatin (LIPITOR) 20 MG tablet TAKE 1 TABLET BY MOUTH  DAILY AT 6 PM 90 tablet 3  . BIOTIN PO Take by mouth.    Marland Kitchen CINNAMON PO Take by mouth.    . furosemide (LASIX) 20 MG tablet Take 20 mg by mouth as needed.    . isosorbide mononitrate (IMDUR) 60 MG 24 hr tablet TAKE 1 TABLET BY MOUTH  DAILY 90 tablet 3  . labetalol (NORMODYNE) 200 MG tablet TAKE 1 TABLET BY MOUTH  TWICE DAILY 180 tablet 3  .  lisinopril (ZESTRIL) 40 MG tablet TAKE 1 TABLET BY MOUTH  DAILY 90 tablet 3  . mupirocin ointment (BACTROBAN) 2 % Apply topically daily.    . nitroGLYCERIN (NITROSTAT) 0.4 MG SL tablet DISSOLVE 1 TABLET UNDER THE TONGUE EVERY 5 MINUTES AS  NEEDED FOR CHEST PAIN. MAX  OF 3 TABLETS IN 15 MINUTES. CALL 911 IF PAIN PERSISTS. 100 tablet 1  . NON FORMULARY Pain cream for arthritis sent to [facility], faxed 11/01/2018 [id number]    . pantoprazole (PROTONIX) 40 MG tablet Take 40 mg by mouth daily.    . potassium chloride SA (KLOR-CON) 20 MEQ tablet Take 20 mEq by mouth as needed.    . Soft Lens Products (REWETTING DROPS) SOLN Place 1 drop 3 (three) times daily as needed into both eyes (for dry/irritated contact lenses).    Marland Kitchen spironolactone (ALDACTONE) 25 MG tablet TAKE 1 TABLET BY MOUTH  DAILY 90 tablet 3  . VITAMIN D PO Take 2,000 mg by mouth daily.    . Vitamin D, Ergocalciferol, (DRISDOL) 1.25 MG (50000 UT) CAPS capsule TAKE 1 CAPSULE BY MOUTH WEEKLY, START VIT D 2,000 UNITS DAILY AFTER 60 DAYS     No current facility-administered medications on file prior to visit.    Allergies  Allergen Reactions  . Azithromycin Swelling  . Other Itching, Swelling, Other (See Comments) and Cough  Horse products  . Penicillins Swelling    Has patient had a PCN reaction causing immediate rash, facial/tongue/throat swelling, SOB or lightheadedness with hypotension: Yes Has patient had a PCN reaction causing severe rash involving mucus membranes or skin necrosis: No Has patient had a PCN reaction that required hospitalization: No Has patient had a PCN reaction occurring within the last 10 years: No If all of the above answers are "NO", then may proceed with Cephalosporin use.   . Tetanus Toxoids Swelling  . Gadolinium Derivatives Hives and Itching    Pt stated that her left arm was itching and I noticed that she had two hives on her chest. No difficulty breathing, sneezing. One 25mg  of benadryl was ordered  by Dr. Jimmye Norman. Pt remained at facility and was monitored before going home. -ldrake  . Morphine And Related Nausea And Vomiting  . Latex   . Augmentin [Amoxicillin-Pot Clavulanate] Rash    Has patient had a PCN reaction causing immediate rash, facial/tongue/throat swelling, SOB or lightheadedness with hypotension: Yes Has patient had a PCN reaction causing severe rash involving mucus membranes or skin necrosis: Yes Has patient had a PCN reaction that required hospitalization:No Has patient had a PCN reaction occurring within the last 10 years: No If all of the above answers are "NO", then may proceed with Cephalosporin use.     Objective: Physical Exam  General: [name] is a pleasant 74 y.o. Caucasian female, in NAD. AAO x 3.   Vascular:  Capillary refill time to digits immediate b/l. Palpable pedal pulses b/l LE. Pedal hair sparse b/l lower extremities. Lower extremity skin temperature gradient within normal limits. No pain with calf compression b/l. No edema noted b/l lower extremities.   Dermatological:  Pedal skin with normal turgor, texture and tone bilaterally. No open wounds bilaterally. No interdigital macerations bilaterally. Toenails 1-5 b/l elongated, discolored, dystrophic, thickened, crumbly with subungual debris and tenderness to dorsal palpation. No hyperkeratotic nor porokeratotic lesions present on today's visit.  Musculoskeletal:  Normal muscle strength 5/5 to all lower extremity muscle groups bilaterally. No pain crepitus or joint limitation noted with ROM b/l. No gross bony deformities bilaterally. Patient ambulates independent of any assistive aids.  Neurological:  Protective sensation intact 5/5 intact bilaterally with 10g monofilament b/l. Vibratory sensation intact b/l. Clonus negative b/l.  Assessment and Plan:  1. Pain due to onychomycosis of toenails of both feet   -Examined patient. -No new findings. No new orders. -Toenails 1-5 b/l were debrided  in length and girth with sterile nail nippers and dremel without iatrogenic bleeding.  -Patient to report any pedal injuries to medical professional immediately. -Patient to continue soft, supportive shoe gear daily. -Patient/POA to call should there be question/concern in the interim.  Return in about 4 months (around 07/19/2020) for painful mycotic toenails.  Marzetta Board, DPM

## 2020-04-17 DIAGNOSIS — H35033 Hypertensive retinopathy, bilateral: Secondary | ICD-10-CM | POA: Diagnosis not present

## 2020-07-16 ENCOUNTER — Ambulatory Visit (INDEPENDENT_AMBULATORY_CARE_PROVIDER_SITE_OTHER): Payer: Medicare Other | Admitting: Podiatry

## 2020-07-16 ENCOUNTER — Encounter: Payer: Self-pay | Admitting: Podiatry

## 2020-07-16 ENCOUNTER — Other Ambulatory Visit: Payer: Self-pay

## 2020-07-16 DIAGNOSIS — M79674 Pain in right toe(s): Secondary | ICD-10-CM

## 2020-07-16 DIAGNOSIS — Z85820 Personal history of malignant melanoma of skin: Secondary | ICD-10-CM | POA: Insufficient documentation

## 2020-07-16 DIAGNOSIS — Z7901 Long term (current) use of anticoagulants: Secondary | ICD-10-CM | POA: Insufficient documentation

## 2020-07-16 DIAGNOSIS — Z955 Presence of coronary angioplasty implant and graft: Secondary | ICD-10-CM | POA: Insufficient documentation

## 2020-07-16 DIAGNOSIS — R7303 Prediabetes: Secondary | ICD-10-CM | POA: Insufficient documentation

## 2020-07-16 DIAGNOSIS — G4730 Sleep apnea, unspecified: Secondary | ICD-10-CM | POA: Insufficient documentation

## 2020-07-16 DIAGNOSIS — E559 Vitamin D deficiency, unspecified: Secondary | ICD-10-CM | POA: Insufficient documentation

## 2020-07-16 DIAGNOSIS — F339 Major depressive disorder, recurrent, unspecified: Secondary | ICD-10-CM | POA: Insufficient documentation

## 2020-07-16 DIAGNOSIS — K219 Gastro-esophageal reflux disease without esophagitis: Secondary | ICD-10-CM | POA: Insufficient documentation

## 2020-07-16 DIAGNOSIS — M79675 Pain in left toe(s): Secondary | ICD-10-CM

## 2020-07-16 DIAGNOSIS — M1990 Unspecified osteoarthritis, unspecified site: Secondary | ICD-10-CM | POA: Insufficient documentation

## 2020-07-16 DIAGNOSIS — F419 Anxiety disorder, unspecified: Secondary | ICD-10-CM | POA: Insufficient documentation

## 2020-07-16 DIAGNOSIS — M8588 Other specified disorders of bone density and structure, other site: Secondary | ICD-10-CM | POA: Insufficient documentation

## 2020-07-16 DIAGNOSIS — M5090 Cervical disc disorder, unspecified, unspecified cervical region: Secondary | ICD-10-CM | POA: Insufficient documentation

## 2020-07-16 DIAGNOSIS — R1310 Dysphagia, unspecified: Secondary | ICD-10-CM | POA: Insufficient documentation

## 2020-07-16 DIAGNOSIS — B351 Tinea unguium: Secondary | ICD-10-CM

## 2020-07-16 DIAGNOSIS — R1032 Left lower quadrant pain: Secondary | ICD-10-CM | POA: Insufficient documentation

## 2020-07-16 DIAGNOSIS — K2100 Gastro-esophageal reflux disease with esophagitis, without bleeding: Secondary | ICD-10-CM | POA: Insufficient documentation

## 2020-07-16 DIAGNOSIS — J302 Other seasonal allergic rhinitis: Secondary | ICD-10-CM | POA: Insufficient documentation

## 2020-07-16 NOTE — Patient Instructions (Signed)
EPSOM SALT FOOT SOAK INSTRUCTIONS  *IF YOU HAVE BEEN PRESCRIBED ANTIBIOTICS, TAKE AS INSTRUCTED UNTIL ALL ARE GONE*  Shopping List:  A. Plain epsom salt (not scented) B. Neosporin Cream/Ointment or Bacitracin Cream/Ointment (or prescribed antiobiotic drops/cream/ointment) C. 1-inch fabric band-aids  1.  Place 1/4 cup of epsom salts in 2 quarts of warm tap water. IF YOU ARE DIABETIC, OR HAVE NEUROPATHY, CHECK THE TEMPERATURE OF THE WATER WITH YOUR ELBOW.  2.  Submerge your foot/feet in the solution and soak for 10-15 minutes.      3.  Next, remove your foot/feet from solution, blot dry the affected area.    4.  Apply light amount of antibiotic cream/ointment and cover with fabric band-aid .  5.  This soak should be done once a day for 7 days.   6.  Monitor for any signs/symptoms of infection such as redness, swelling, odor, drainage, increased pain, or non-healing of digit.   7.  Please do not hesitate to call the office and speak to a Nurse or Doctor if you have questions.   8.  If you experience fever, chills, nightsweats, nausea or vomiting with worsening of digit, please go to the emergency room.

## 2020-07-18 ENCOUNTER — Telehealth: Payer: Self-pay | Admitting: *Deleted

## 2020-07-18 NOTE — Telephone Encounter (Signed)
   [facility] Pre-operative Risk Assessment    Patient Name: LACRESHIA BONDARENKO  DOB: April 03, 1946  MRN: [id number]   HEARTCARE STAFF: - Please ensure there is not already an duplicate clearance open for this procedure. - Under Visit Info/Reason for Call, type in Other and utilize the format Clearance MM/DD/YY or Clearance TBD. Do not use dashes or single digits. - If request is for dental extraction, please clarify the # of teeth to be extracted.  Request for surgical clearance: CLEARANCE REQUEST STATES IS URGENT  1. What type of surgery is being performed? ENDOSCOPY WITH DILATATION  2. When is this surgery scheduled? 07/25/20   3. What type of clearance is required (medical clearance vs. Pharmacy clearance to hold med vs. Both)? BOTH  4. Are there any medications that need to be held prior to surgery and how long? ELIQUIS   5. Practice name and name of physician performing surgery? [facility]; DR. MAGOD   6. What is the office phone number? [phone]   7.   What is the office fax number? [phone]  8.   Anesthesia type (None, local, MAC, general) ? NOT LISTED   Julaine Hua 07/18/2020, 5:46 PM  _________________________________________________________________   (provider comments below)

## 2020-07-19 NOTE — Telephone Encounter (Signed)
Clinical pharmacist to review Eliquis. Previous office did not mention why the patient is on Eliquis. During her previous hospitalization in March 2020, Dr. Radford Pax wrote in a note on 07/10/2018 that the patient was "Hospitalized in [place] in January with afib and PNA and started on diuretics and anticoagulation." Therefore, it appears she is on Eliquis due to PAF in Jan 2020 and Eliquis was started in [place].

## 2020-07-19 NOTE — Telephone Encounter (Signed)
   Patient Name: [name]  DOB: 1945-06-27  MRN: [id number]   Primary Cardiologist: Larae Grooms, MD  Chart reviewed as part of pre-operative protocol coverage. Patient was contacted 07/19/2020 in reference to pre-operative risk assessment for pending surgery as outlined below.  AGNES BRIGHTBILL was last seen on 02/01/2020 by Dr. Irish Lack.  Since that day, Laina F Grigorian has done well without chest pain or shortness of breath.  Therefore, based on ACC/AHA guidelines, the patient would be at acceptable risk for the planned procedure without further cardiovascular testing.   The case has been reviewed by our clinical pharmacist who recommend the patient hold Eliquis for 2 days prior to the procedure and restart as soon as possible after the procedure at the surgeon's discretion.  The patient was advised that if she develops new symptoms prior to surgery to contact our office to arrange for a follow-up visit, and she verbalized understanding.  I will route this recommendation to the requesting party via Epic fax function and remove from pre-op pool. Please call with questions.  [place], [place] 07/19/2020, 2:55 PM

## 2020-07-19 NOTE — Telephone Encounter (Signed)
Patient with diagnosis of A Fib on Eliquis for anticoagulation.    1. Procedure: ENDOSCOPY WITH DILATATION  Date of procedure: 07/25/20   CHA2DS2-VASc Score = 5  This indicates a 7.2% annual risk of stroke. The patient's score is based upon: CHF History: Yes HTN History: Yes Diabetes History: No Stroke History: No Vascular Disease History: Yes Age Score: 1 Gender Score: 1    CrCl 56 mL/min using adjusted body weight Platelet count 179K  Per office protocol, patient can hold Eliquis for 1-2 days prior to procedure.

## 2020-07-22 NOTE — Progress Notes (Signed)
Subjective: [name] is a pleasant 75 y.o. female patient seen today painful thick toenails that are difficult to trim. Pain interferes with ambulation. Aggravating factors include wearing enclosed shoe gear. Pain is relieved with periodic professional debridement.  PCP is Dr. Hulan Fess and last visit was six months ago.  Allergies  Allergen Reactions  . Azithromycin Swelling  . Other Itching, Swelling, Other (See Comments) and Cough    Horse products  . Penicillins Swelling    Has patient had a PCN reaction causing immediate rash, facial/tongue/throat swelling, SOB or lightheadedness with hypotension: Yes Has patient had a PCN reaction causing severe rash involving mucus membranes or skin necrosis: No Has patient had a PCN reaction that required hospitalization: No Has patient had a PCN reaction occurring within the last 10 years: No If all of the above answers are "NO", then may proceed with Cephalosporin use.   . Tetanus Toxoids Swelling  . Gadolinium Derivatives Hives and Itching    Pt stated that her left arm was itching and I noticed that she had two hives on her chest. No difficulty breathing, sneezing. One 25mg  of benadryl was ordered by Dr. Jimmye Norman. Pt remained at facility and was monitored before going home. -ldrake  . Morphine And Related Nausea And Vomiting  . Cefazolin     Other reaction(s): rash  . Latex   . Augmentin [Amoxicillin-Pot Clavulanate] Rash    Has patient had a PCN reaction causing immediate rash, facial/tongue/throat swelling, SOB or lightheadedness with hypotension: Yes Has patient had a PCN reaction causing severe rash involving mucus membranes or skin necrosis: Yes Has patient had a PCN reaction that required hospitalization:No Has patient had a PCN reaction occurring within the last 10 years: No If all of the above answers are "NO", then may proceed with Cephalosporin use.    Objective: Physical Exam  General: [name] is a pleasant  75 y.o. Caucasian female, in NAD. AAO x 3.   Vascular:  Capillary refill time to digits immediate b/l. Palpable pedal pulses b/l LE. Pedal hair sparse b/l lower extremities. Lower extremity skin temperature gradient within normal limits. No pain with calf compression b/l. No edema noted b/l lower extremities.   Dermatological:  Pedal skin with normal turgor, texture and tone bilaterally. No open wounds bilaterally. No interdigital macerations bilaterally. Toenails 1-5 b/l elongated, discolored, dystrophic, thickened, crumbly with subungual debris and tenderness to dorsal palpation. No hyperkeratotic nor porokeratotic lesions present on today's visit.  Musculoskeletal:  Normal muscle strength 5/5 to all lower extremity muscle groups bilaterally. No pain crepitus or joint limitation noted with ROM b/l. No gross bony deformities bilaterally. Patient ambulates independent of any assistive aids.  Neurological:  Protective sensation intact 5/5 intact bilaterally with 10g monofilament b/l. Vibratory sensation intact b/l. Clonus negative b/l.  Assessment and Plan:  1. Pain due to onychomycosis of toenails of both feet     -Examined patient. -No new findings. No new orders. -Toenails 1-5 b/l were debrided in length and girth with sterile nail nippers and dremel without iatrogenic bleeding.  -Patient to report any pedal injuries to medical professional immediately. -Patient to continue soft, supportive shoe gear daily. -Patient/POA to call should there be question/concern in the interim.  Return in about 9 weeks (around 09/17/2020).  Marzetta Board, DPM

## 2020-07-26 IMAGING — RF DG ESOPHAGUS
6 series · 14 of 21 positions shown · non-contrast
Comparison: None.

CLINICAL DATA: Dysphagia.

EXAM:
ESOPHOGRAM / BARIUM SWALLOW / BARIUM TABLET STUDY
TECHNIQUE: Combined double contrast and single contrast examination performed
using effervescent crystals, thick barium liquid, and thin barium
liquid. The patient was observed with fluoroscopy swallowing a 13 mm
barium sulphate tablet.
FLUOROSCOPY TIME:  Fluoroscopy Time:  1 minutes 18 second
Radiation Exposure Index (if provided by the fluoroscopic device):
316 mGy
Number of Acquired Spot Images: 0

[Series 1: sequence · 0.28mm/px · 3 of 29 frames shown (1 of 4)]
[frame 5/29]
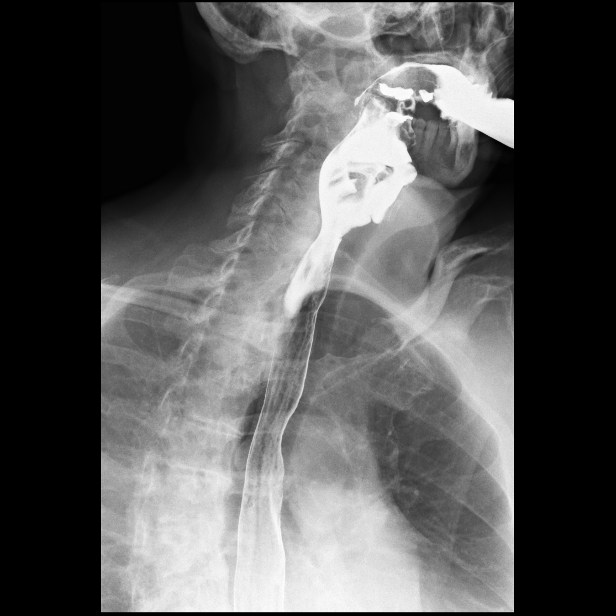
[frame 25/29]
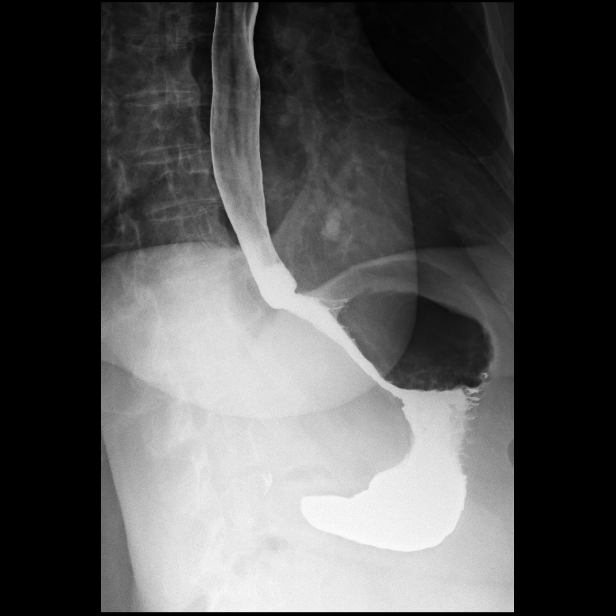
[frame 28/29]
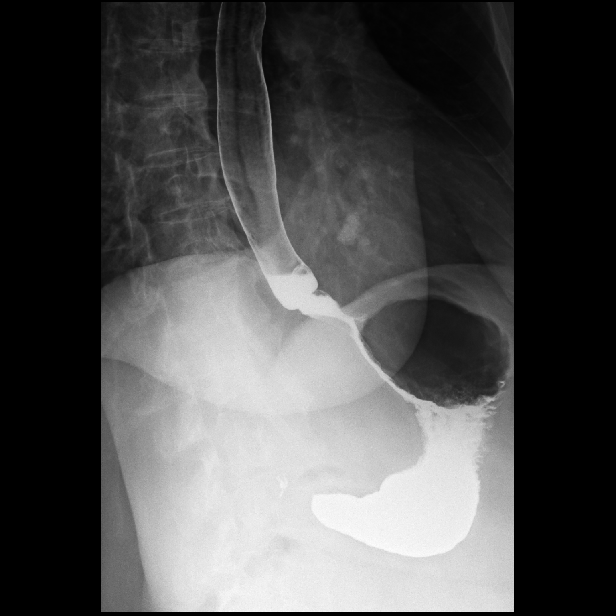

[Series 2: sequence · 0.28mm/px · 2 of 15 frames shown (2 of 4)]
[frame 3/15]
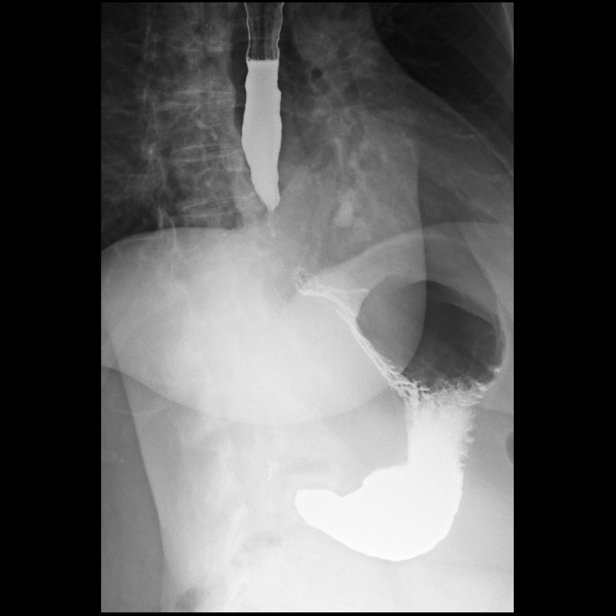
[frame 8/15]
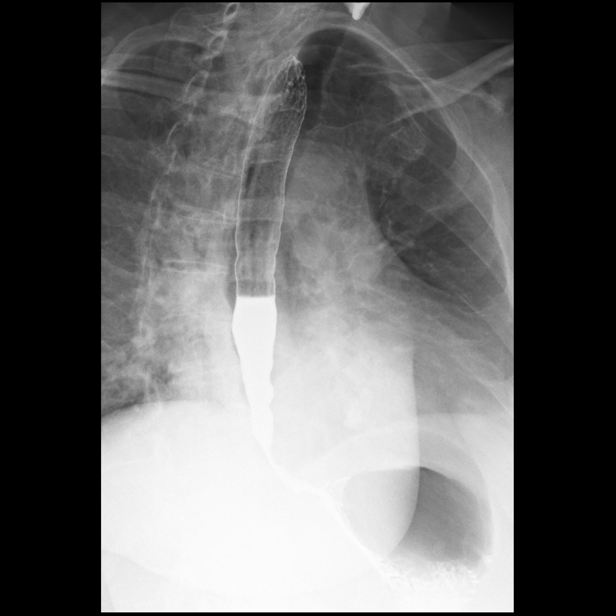

[Series 3: one shot · 2 of 3 slices shown (1 of 2)]
[im 1/3]
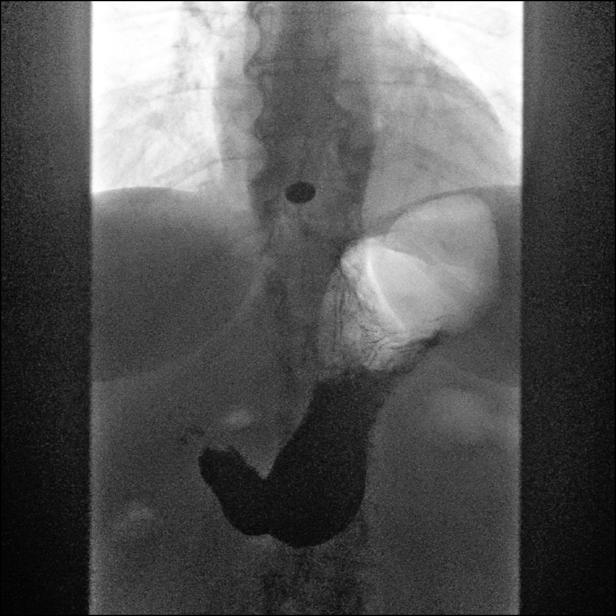
[im 2/3]
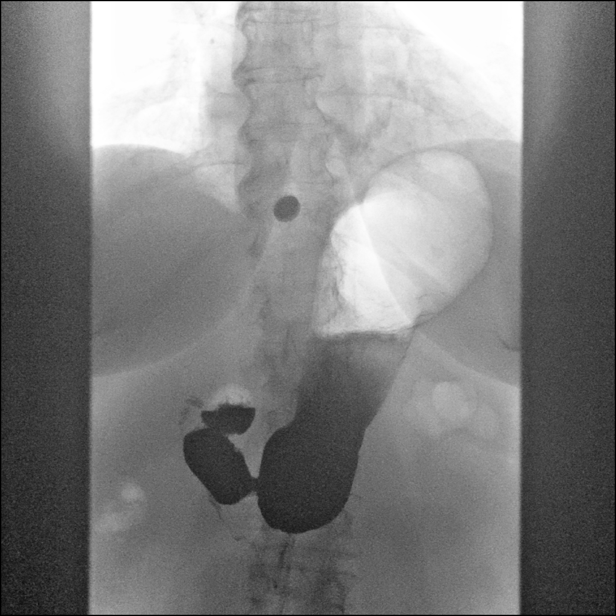

[Series 4: sequence · 0.28mm/px · 3 of 30 frames shown (3 of 4)]
[frame 5/30]
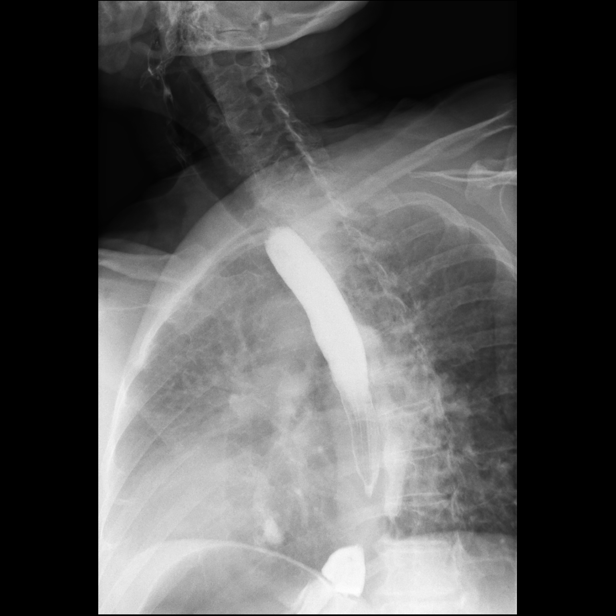
[frame 15/30]
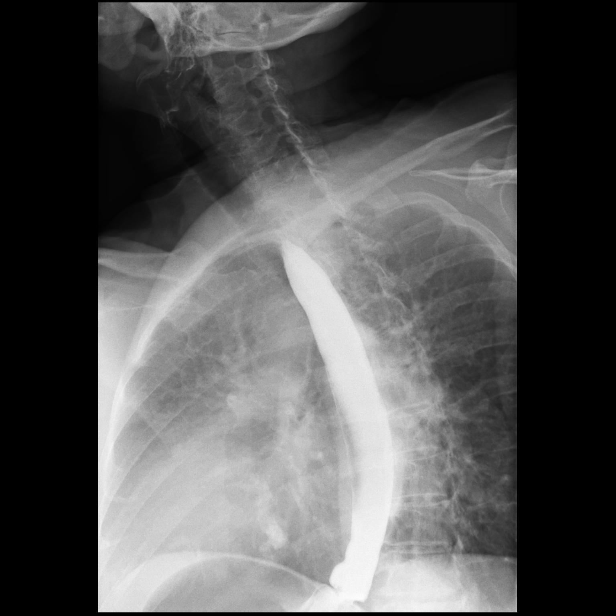
[frame 26/30]
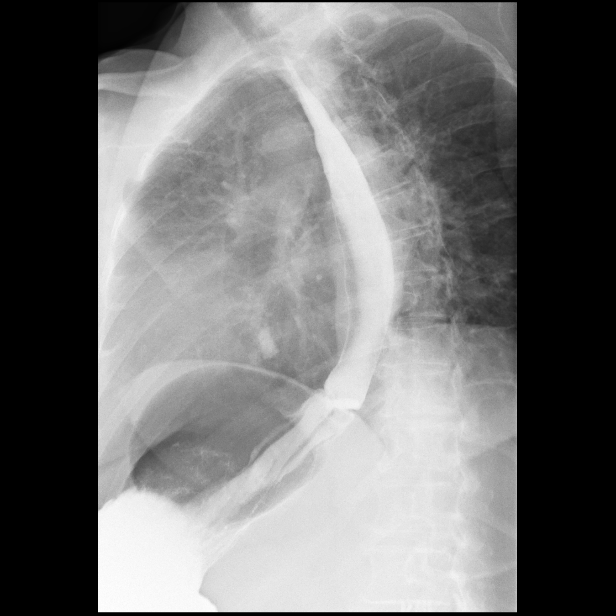

[Series 5: sequence · 0.28mm/px · 3 of 19 frames shown (4 of 4)]
[frame 3/19]
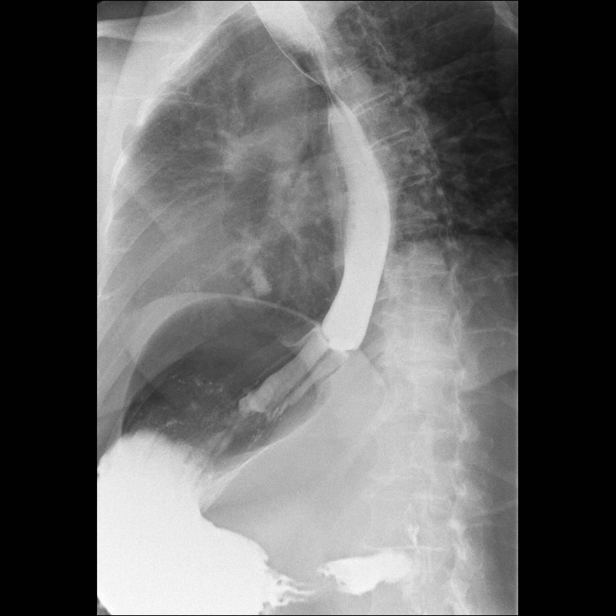
[frame 10/19]
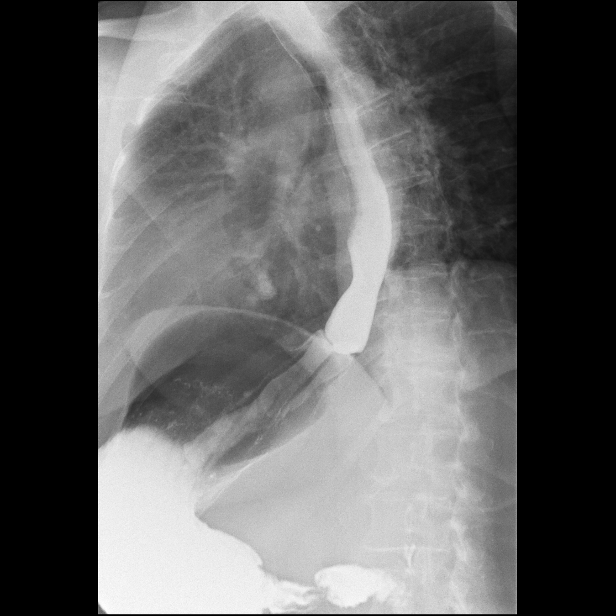
[frame 17/19]
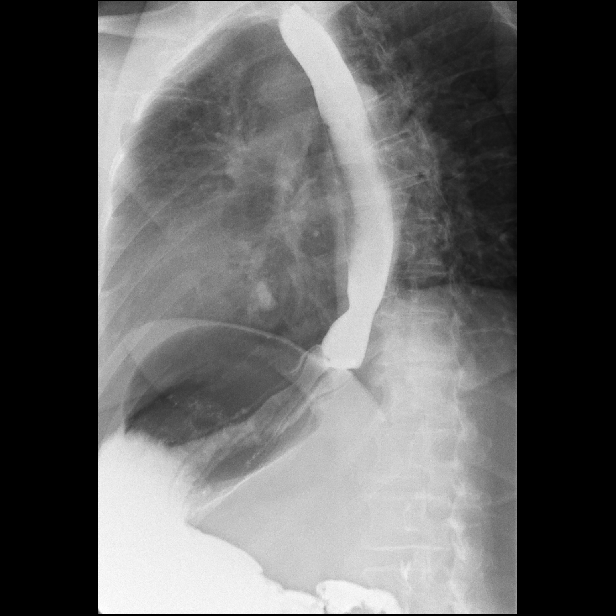

[Series 6: one shot · 0.14mm/px · 1 of 2 slices shown (2 of 2)]
[im 2/2]
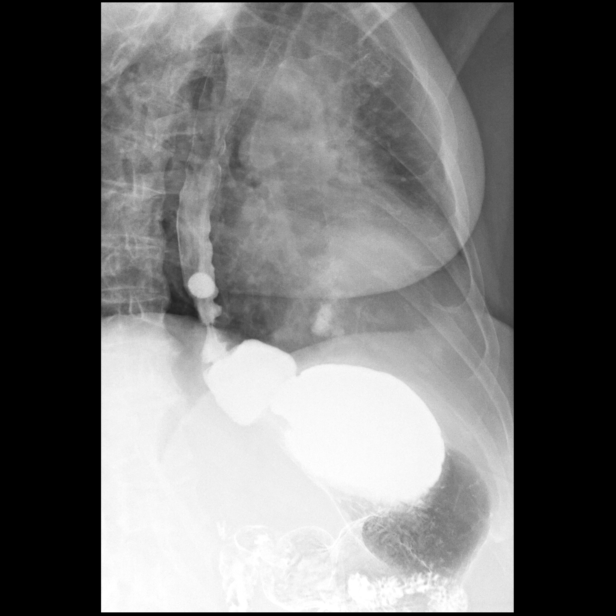

[14 of 21 positions shown; findings below may reference images not displayed]

FINDINGS: Mildly decreased esophageal motility. Short segment benign-appearing
stricture at the GE junction. Moderate to severe stenosis. Barium
tablet did not pass through the stricture after several minutes.

Small hiatal hernia with mild gastroesophageal reflux.

The patient aspirated while drinking in the prone position.
Initially there was no coughing however the patient did cough
several minutes later. The patient has a history of prior pneumonia.
IMPRESSION: Moderate to high-grade benign-appearing stricture of the GE
junction. Barium tablet did not pass through this area. There is
mild gastroesophageal reflux

Moderate aspiration was noted while the patient drinking in the
prone position. Given history of pneumonia, recommend speech
pathology evaluation.

## 2020-09-17 ENCOUNTER — Other Ambulatory Visit: Payer: Self-pay

## 2020-09-17 ENCOUNTER — Ambulatory Visit: Payer: Medicare Other | Admitting: Podiatry

## 2020-09-17 ENCOUNTER — Encounter: Payer: Self-pay | Admitting: Podiatry

## 2020-09-17 DIAGNOSIS — B351 Tinea unguium: Secondary | ICD-10-CM

## 2020-09-17 DIAGNOSIS — K5730 Diverticulosis of large intestine without perforation or abscess without bleeding: Secondary | ICD-10-CM | POA: Insufficient documentation

## 2020-09-17 DIAGNOSIS — M79674 Pain in right toe(s): Secondary | ICD-10-CM | POA: Diagnosis not present

## 2020-09-17 DIAGNOSIS — Z860100 Personal history of colon polyps, unspecified: Secondary | ICD-10-CM | POA: Insufficient documentation

## 2020-09-17 DIAGNOSIS — Z1211 Encounter for screening for malignant neoplasm of colon: Secondary | ICD-10-CM | POA: Insufficient documentation

## 2020-09-17 DIAGNOSIS — L600 Ingrowing nail: Secondary | ICD-10-CM

## 2020-09-17 DIAGNOSIS — R194 Change in bowel habit: Secondary | ICD-10-CM | POA: Insufficient documentation

## 2020-09-17 DIAGNOSIS — M79675 Pain in left toe(s): Secondary | ICD-10-CM

## 2020-09-17 DIAGNOSIS — E7800 Pure hypercholesterolemia, unspecified: Secondary | ICD-10-CM | POA: Insufficient documentation

## 2020-09-17 DIAGNOSIS — R159 Full incontinence of feces: Secondary | ICD-10-CM | POA: Insufficient documentation

## 2020-09-17 NOTE — Patient Instructions (Signed)
EPSOM SALT FOOT SOAK INSTRUCTIONS   Shopping List:  A. Plain epsom salt (not scented) B. Mupirocin Ointment C. 1-inch fabric band-aids  1.  Place 1/4 cup of epsom salts in 2 quarts of warm tap water. IF YOU ARE DIABETIC, OR HAVE NEUROPATHY, CHECK THE TEMPERATURE OF THE WATER WITH YOUR ELBOW.  2.  Submerge your foot/feet in the solution and soak for 10-15 minutes.      3.  Next, remove your foot/feet from solution, blot dry the affected area.    4.  Apply light amount of antibiotic ointment and cover with fabric band-aid .  5.  This soak should be done once a day for 7 days.   6.  Monitor for any signs/symptoms of infection such as redness, swelling, odor, drainage, increased pain, or non-healing of digit.   7.  Please do not hesitate to call the office and speak to a Nurse or Doctor if you have questions.   8.  If you experience fever, chills, nightsweats, nausea or vomiting with worsening of digit, please go to the emergency room.

## 2020-09-21 NOTE — Progress Notes (Signed)
Subjective:  Patient ID: [name], female    DOB: June 27, 1945,  MRN: [id number]  BRENLYNN FAKE presents to clinic today for ingrown toenail to the medial and lateral border of L hallux  Allergies  Allergen Reactions   Azithromycin Swelling    Other reaction(s): swelling   Other Itching, Swelling, Other (See Comments) and Cough    Horse products Other reaction(s): reaction Other reaction(s): Unknown Other reaction(s): rash   Penicillins Swelling    Has patient had a PCN reaction causing immediate rash, facial/tongue/throat swelling, SOB or lightheadedness with hypotension: Yes Has patient had a PCN reaction causing severe rash involving mucus membranes or skin necrosis: No Has patient had a PCN reaction that required hospitalization: No Has patient had a PCN reaction occurring within the last 10 years: No If all of the above answers are "NO", then may proceed with Cephalosporin use.    Tetanus Toxoids Swelling    Other reaction(s): sickness   Gadolinium Derivatives Hives and Itching    Pt stated that her left arm was itching and I noticed that she had two hives on her chest. No difficulty breathing, sneezing. One 25mg  of benadryl was ordered by Dr. Jimmye Norman. Pt remained at facility and was monitored before going home. -ldrake   Morphine And Related Nausea And Vomiting   Cefazolin     Other reaction(s): rash Other reaction(s): rash   Latex    Penicillin G     Other reaction(s): Unknown   Amoxicillin-Pot Clavulanate Rash    Has patient had a PCN reaction causing immediate rash, facial/tongue/throat swelling, SOB or lightheadedness with hypotension: Yes Has patient had a PCN reaction causing severe rash involving mucus membranes or skin necrosis: Yes Has patient had a PCN reaction that required hospitalization:No Has patient had a PCN reaction occurring within the last 10 years: No If all of the above answers are "NO", then may proceed with Cephalosporin use.  Other  reaction(s): rash, swelling    Review of Systems: Negative except as noted in the HPI. Objective:   Constitutional [name] is a pleasant 75 y.o. Caucasian female, in NAD. AAO x 3.   Vascular Capillary refill time to digits immediate b/l. Palpable pedal pulses b/l LE. Pedal hair sparse. Lower extremity skin temperature gradient within normal limits. No cyanosis or clubbing noted.  Neurologic Normal speech. Oriented to person, place, and time. Protective sensation intact 5/5 intact bilaterally with 10g monofilament b/l.  Dermatologic Pedal skin with normal turgor, texture and tone bilaterally. No open wounds bilaterally. No interdigital macerations bilaterally. Toenails 1-5 b/l elongated, discolored, dystrophic, thickened, crumbly with subungual debris and tenderness to dorsal palpation. Incurvated nailplate bilateral border(s) L hallux.  Nail border hypertrophy present. There is tenderness to palpation. Sign(s) of infection: no clinical signs of infection noted on examination today..  Orthopedic: Normal muscle strength 5/5 to all lower extremity muscle groups bilaterally. No pain crepitus or joint limitation noted with ROM b/l. No gross bony deformities bilaterally. Patient ambulates independent of any assistive aids.   Radiographs: None Assessment:  No diagnosis found. Plan:  Patient was evaluated and treated and all questions answered.  Onychomycosis with pain -Nails palliatively debridement as below -Educated on self-care  Procedure: Nail Debridement Rationale: Pain Type of Debridement: manual, sharp debridement. Instrumentation: Nail nipper, rotary burr. Number of Nails: 10 -Examined patient. -Patient to continue soft, supportive shoe gear daily. -Toenails 1-5 b/l were debrided in length and girth with sterile nail nippers and dremel without iatrogenic bleeding.  -Patient  unable to tolerate debridement of left great toe without local anesthesia. Verbal consent obtained for  local block. Administered 3 cc of 2% Lidocaine plain to left hallux. Offending nail border debrided and curretaged L hallux utilizing sterile nail nipper and currette. Border(s) cleansed with alcohol and triple antibiotic ointment applied. Dispensed written instructions for once daily epsom salt soaks for 7 days. She has tube of Mupirocin Ointment from previous appointment. We also discussed permanent procedure to prevent recurrence in the future. -Patient to report any pedal injuries to medical professional immediately. -Patient/POA to call should there be question/concern in the interim.  Return in about 9 weeks (around 11/19/2020).  Marzetta Board, DPM

## 2020-11-09 ENCOUNTER — Other Ambulatory Visit: Payer: Self-pay | Admitting: Interventional Cardiology

## 2020-11-12 ENCOUNTER — Telehealth: Payer: Self-pay | Admitting: Pharmacist

## 2020-11-12 MED ORDER — APIXABAN 5 MG PO TABS: 5.0000 mg | ORAL_TABLET | Freq: Two times a day (BID) | ORAL | 0 refills | Status: DC

## 2020-11-12 NOTE — Telephone Encounter (Signed)
Prescription refill request for Eliquis received. Last office visit:[place] 02/01/20 Scr:1.060 mg/ 08/28/2020 Age: 79fWeight:92.5kg

## 2020-11-12 NOTE — Addendum Note (Signed)
Addended by: Marcelle Overlie D on: 11/12/2020 03:01 PM   Modules accepted: Orders

## 2020-11-12 NOTE — Telephone Encounter (Signed)
Patient in the coverage gap and cannot afford Eliquis. I will give her 2 weeks worth of samples. She states she did not qualify for pt assistance last year. Does not want to change from Eliquis. Advised I can only give 2 weeks worth. Pt states that would be fine.

## 2020-11-19 ENCOUNTER — Ambulatory Visit: Payer: Medicare Other | Admitting: Podiatry

## 2020-11-19 ENCOUNTER — Encounter: Payer: Self-pay | Admitting: Podiatry

## 2020-11-19 ENCOUNTER — Other Ambulatory Visit: Payer: Self-pay

## 2020-11-19 DIAGNOSIS — B351 Tinea unguium: Secondary | ICD-10-CM

## 2020-11-19 DIAGNOSIS — M79675 Pain in left toe(s): Secondary | ICD-10-CM | POA: Diagnosis not present

## 2020-11-19 DIAGNOSIS — M79674 Pain in right toe(s): Secondary | ICD-10-CM

## 2020-11-22 NOTE — Progress Notes (Signed)
Subjective: [name] is a pleasant 75 y.o. female patient seen today painful thick toenails that are difficult to trim. Pain interferes with ambulation. Aggravating factors include wearing enclosed shoe gear. Pain is relieved with periodic professional debridement.  She states her left great toe feels much better now.  PCP is Hulan Fess, MD. Last visit was: April 2022.  Allergies  Allergen Reactions   Azithromycin Swelling    Other reaction(s): swelling   Other Itching, Swelling, Other (See Comments) and Cough    Horse products Other reaction(s): reaction Other reaction(s): Unknown Other reaction(s): rash   Penicillins Swelling    Has patient had a PCN reaction causing immediate rash, facial/tongue/throat swelling, SOB or lightheadedness with hypotension: Yes Has patient had a PCN reaction causing severe rash involving mucus membranes or skin necrosis: No Has patient had a PCN reaction that required hospitalization: No Has patient had a PCN reaction occurring within the last 10 years: No If all of the above answers are "NO", then may proceed with Cephalosporin use.    Tetanus Toxoids Swelling    Other reaction(s): sickness   Gadolinium Derivatives Hives and Itching    Pt stated that her left arm was itching and I noticed that she had two hives on her chest. No difficulty breathing, sneezing. One '25mg'$  of benadryl was ordered by Dr. Jimmye Norman. Pt remained at facility and was monitored before going home. -ldrake   Morphine And Related Nausea And Vomiting   Cefazolin     Other reaction(s): rash Other reaction(s): rash   Latex    Penicillin G     Other reaction(s): Unknown   Amoxicillin-Pot Clavulanate Rash    Has patient had a PCN reaction causing immediate rash, facial/tongue/throat swelling, SOB or lightheadedness with hypotension: Yes Has patient had a PCN reaction causing severe rash involving mucus membranes or skin necrosis: Yes Has patient had a PCN reaction that  required hospitalization:No Has patient had a PCN reaction occurring within the last 10 years: No If all of the above answers are "NO", then may proceed with Cephalosporin use.  Other reaction(s): rash, swelling    Objective: Physical Exam  General: [name] is a pleasant 75 y.o. Caucasian female, in NAD. AAO x 3.   Vascular:  Capillary refill time to digits immediate b/l. Palpable DP pulse(s) b/l lower extremities Palpable PT pulse(s) b/l lower extremities Pedal hair sparse. Lower extremity skin temperature gradient within normal limits.  Dermatological:  Pedal skin with normal turgor, texture and tone b/l lower extremities. No open wounds b/l lower extremities. No interdigital macerations b/l lower extremities. Toenails 1-5 b/l elongated, discolored, dystrophic, thickened, crumbly with subungual debris and tenderness to dorsal palpation.  Musculoskeletal:  Normal muscle strength 5/5 to all lower extremity muscle groups bilaterally. No pain crepitus or joint limitation noted with ROM b/l lower extremities. No gross bony deformities b/l lower extremities.  Neurological:  Protective sensation intact 5/5 intact bilaterally with 10g monofilament b/l.  Assessment and Plan:  1. Pain due to onychomycosis of toenails of both feet      -Examined patient. -No new findings. No new orders. -Patient to continue soft, supportive shoe gear daily. -Toenails 1-5 b/l were debrided in length and girth with sterile nail nippers and dremel without iatrogenic bleeding.  -Patient to report any pedal injuries to medical professional immediately. -Patient/POA to call should there be question/concern in the interim.  Return in about 9 weeks (around 01/21/2021).  Marzetta Board, DPM

## 2021-01-21 ENCOUNTER — Encounter: Payer: Self-pay | Admitting: Podiatry

## 2021-01-21 ENCOUNTER — Other Ambulatory Visit: Payer: Self-pay

## 2021-01-21 ENCOUNTER — Ambulatory Visit: Payer: Medicare Other | Admitting: Podiatry

## 2021-01-21 DIAGNOSIS — M79675 Pain in left toe(s): Secondary | ICD-10-CM

## 2021-01-21 DIAGNOSIS — M79674 Pain in right toe(s): Secondary | ICD-10-CM

## 2021-01-21 DIAGNOSIS — B351 Tinea unguium: Secondary | ICD-10-CM

## 2021-01-26 NOTE — Progress Notes (Signed)
Subjective:  Patient ID: [name], female    DOB: 01-16-1946,  MRN: [id number]  LYNN RECENDIZ presents to clinic today for thick, elongated toenails b/l feet which are tender when wearing enclosed shoe gear.  She notes no new pedal problems on today's visit.  PCP is Rankins, Bill Salinas, MD. Last visit was April, 2022.  Allergies  Allergen Reactions   Azithromycin Swelling    Other reaction(s): swelling   Other Itching, Swelling, Other (See Comments) and Cough    Horse products Other reaction(s): reaction Other reaction(s): Unknown Other reaction(s): rash   Penicillins Swelling    Has patient had a PCN reaction causing immediate rash, facial/tongue/throat swelling, SOB or lightheadedness with hypotension: Yes Has patient had a PCN reaction causing severe rash involving mucus membranes or skin necrosis: No Has patient had a PCN reaction that required hospitalization: No Has patient had a PCN reaction occurring within the last 10 years: No If all of the above answers are "NO", then may proceed with Cephalosporin use.    Tetanus Toxoids Swelling    Other reaction(s): sickness   Gadolinium Derivatives Hives and Itching    Pt stated that her left arm was itching and I noticed that she had two hives on her chest. No difficulty breathing, sneezing. One 25mg  of benadryl was ordered by Dr. Jimmye Norman. Pt remained at facility and was monitored before going home. -ldrake   Morphine And Related Nausea And Vomiting   Cefazolin     Other reaction(s): rash Other reaction(s): rash   Latex    Penicillin G     Other reaction(s): Unknown   Amoxicillin-Pot Clavulanate Rash    Has patient had a PCN reaction causing immediate rash, facial/tongue/throat swelling, SOB or lightheadedness with hypotension: Yes Has patient had a PCN reaction causing severe rash involving mucus membranes or skin necrosis: Yes Has patient had a PCN reaction that required hospitalization:No Has patient had a PCN  reaction occurring within the last 10 years: No If all of the above answers are "NO", then may proceed with Cephalosporin use.  Other reaction(s): rash, swelling    Review of Systems: Negative except as noted in the HPI. Objective:   Constitutional ANIYIAH ZELL is a pleasant 75 y.o. Caucasian female, in NAD. AAO x 3.   Vascular Capillary refill time to digits immediate b/l lower extremities. Palpable pedal pulses b/l LE. Pedal hair sparse. Lower extremity skin temperature gradient within normal limits. No pain with calf compression b/l. No edema noted b/l lower extremities. No cyanosis or clubbing noted.  Neurologic Normal speech. Oriented to person, place, and time. Protective sensation intact 5/5 intact bilaterally with 10g monofilament b/l.  Dermatologic Skin warm and supple b/l lower extremities. No open wounds b/l LE. No interdigital macerations b/l lower extremities. Toenails 1-5 b/l elongated, discolored, dystrophic, thickened, crumbly with subungual debris and tenderness to dorsal palpation.  Orthopedic: Normal muscle strength 5/5 to all lower extremity muscle groups bilaterally. No pain crepitus or joint limitation noted with ROM b/l lower extremities. No gross bony deformities b/l lower extremities.   Radiographs: None Assessment:   1. Pain due to onychomycosis of toenails of both feet    Plan:  Patient was evaluated and treated and all questions answered. Consent given for treatment as described below: -No new findings. No new orders. -Patient to continue soft, supportive shoe gear daily. -Toenails 1-5 b/l were debrided in length and girth with sterile nail nippers and dremel without iatrogenic bleeding.  -Patient to report any  pedal injuries to medical professional immediately. -Patient/POA to call should there be question/concern in the interim.  Return in about 3 months (around 04/23/2021).  Marzetta Board, DPM

## 2021-02-20 ENCOUNTER — Other Ambulatory Visit (HOSPITAL_BASED_OUTPATIENT_CLINIC_OR_DEPARTMENT_OTHER): Payer: Self-pay

## 2021-02-20 ENCOUNTER — Ambulatory Visit: Payer: Medicare Other | Attending: Internal Medicine

## 2021-02-20 DIAGNOSIS — Z23 Encounter for immunization: Secondary | ICD-10-CM

## 2021-02-20 MED ORDER — PFIZER COVID-19 VAC BIVALENT 30 MCG/0.3ML IM SUSP
INTRAMUSCULAR | 0 refills | Status: DC
  Filled 2021-02-20: qty 0.3, 1d supply, fill #0

## 2021-02-20 NOTE — Progress Notes (Signed)
   Covid-19 Vaccination Clinic  Name:  [name]    MRN: [id number] DOB: [birth year]  02/20/2021  Ms. [name] was observed post Covid-19 immunization for 15 minutes without incident. She was provided with Vaccine Information Sheet and instruction to access the V-Safe system.   [name] was instructed to call 911 with any severe reactions post vaccine: Difficulty breathing  Swelling of face and throat  A fast heartbeat  A bad rash all over body  Dizziness and weakness   Immunizations Administered     Name Date Dose VIS Date Route   Pfizer Covid-19 Vaccine Bivalent Booster 02/20/2021  1:28 PM 0.3 mL 12/12/2020 Intramuscular   Manufacturer: Normal   Lot: [id number]   [place]: [phone]

## 2021-03-14 ENCOUNTER — Telehealth: Payer: Self-pay | Admitting: Interventional Cardiology

## 2021-03-14 NOTE — Telephone Encounter (Signed)
Patient calling the office for samples of medication:   1.  What medication and dosage are you requesting samples for? Eliquis   2.  Are you currently out of this medication? Will be out before the end of the year

## 2021-03-15 NOTE — Telephone Encounter (Signed)
**Note De-Identified Bayyinah Dukeman Obfuscation** The pt states that she is not eliable for pt asst as she does not qualify and that she cannot afford her Eliquis at this time.  I advised her that we can leave her a 2 weeks supply of Eliquis 5 mg ONLY as our samples are limited.  She verbalized understanding and thanked me for our assistance.

## 2021-03-15 NOTE — Telephone Encounter (Signed)
Prescription refill request for Eliquis received. Indication: Afib ( Per d/c summary note 3/27/2020[facility] stay) Last office visit: 01/2020- scheduled to see varanasi on 03/28/2021 Scr:1.54, 02/18/2021 Age: 75 yo  Weight: 92.5 kg   Per dosing criteria pt is on the correct dose of Eliquis 5mg  BID.    Per phone note from Edythe Lynn D 11/12/2020 Note Patient in the coverage gap and cannot afford Eliquis. I will give her 2 weeks worth of samples. She states she did not qualify for pt assistance last year. Does not want to change from Eliquis. Advised I can only give 2 weeks worth. Pt states that would be fine.

## 2021-03-15 NOTE — Telephone Encounter (Signed)
Leaving pt 2 boxes of Eliquis 5 mg tablets at the front desk for pt to pick up. Lot#   [id number]  Exp: 01/2023

## 2021-03-25 ENCOUNTER — Other Ambulatory Visit: Payer: Self-pay

## 2021-03-25 ENCOUNTER — Encounter: Payer: Self-pay | Admitting: Podiatry

## 2021-03-25 ENCOUNTER — Ambulatory Visit: Payer: Medicare Other | Admitting: Podiatry

## 2021-03-25 DIAGNOSIS — M79675 Pain in left toe(s): Secondary | ICD-10-CM

## 2021-03-25 DIAGNOSIS — M79674 Pain in right toe(s): Secondary | ICD-10-CM

## 2021-03-25 DIAGNOSIS — B351 Tinea unguium: Secondary | ICD-10-CM

## 2021-03-25 DIAGNOSIS — N183 Chronic kidney disease, stage 3 unspecified: Secondary | ICD-10-CM | POA: Insufficient documentation

## 2021-03-26 NOTE — Progress Notes (Signed)
Cardiology Office Note   Date:  03/28/2021   ID:  [name], [name] October 19, 1945, MRN [id number]  PCP:  Aretta Nip, MD    No chief complaint on file.  CAD  Wt Readings from Last 3 Encounters:  03/28/21 202 lb 3.2 oz (91.7 kg)  02/01/20 204 lb (92.5 kg)  02/01/19 208 lb (94.3 kg)       History of Present Illness: [name] is a 75 y.o. female   with CAD. She had PCI in 1999 and 2002.  She had a cath without obstructive disease in 2015.   She has had a chronic headache and is now seeing neuro.   She had acute diastolic heart failure after a trip to [place] and [place].  She returned in March 2020.  She was tested for COVID and this was negative.   She was diuresed 20 lbs.  She may have eaten more salt.  Troponin was negative.  BNP was elevated.    She was discharged in Lasix 20 mg daily, discharged from [facility] at the end of March 2020.    She used Biotin for hair loss.    In 08/2018, Some high BP readings, up to the 160s.  At last visit, plan was : Increase amlopdine to 10 mg daily.   A few weeks later: "She will move amlodipine and lisinopril to evening.  She will call in with readings.  If too low in AM, move amlodipine back to AM."     At 01/14/2019 visit, plan was :"Add spironolactone 25 mg daily.  Continue lasix daily.  BMet next week.   Recheck in a few weeks.  If spironolactone helps get BP to therapeutic range, would stop clonidine. Refill Lasix for daily use. "  Lost weight in 2021.    She has decreasing stamina.  Gets very fatigued at the end of the day.  Walking has decreased due to pain.  She does now have some shortness of breath that is worse than before.  Had a recent episode of chest pressure requiring 2 SL NTG.  BP has been low at times.  Lowest 97/59. Did not feel well those days.     Past Medical History:  Diagnosis Date   Anxiety    Arthritis    Bone spur    Coronary artery disease    stent - 1999   Depression     Fibrocystic breast changes    GERD (gastroesophageal reflux disease)    Glucosuria    Hernia, inguinal, left 10/2002   Hypertension    Melanoma ([place]) 10/2008   right shoulder and arm   Menorrhagia    Myocardial infarction ([place]) 1999    Past Surgical History:  Procedure Laterality Date   ANGIOPLASTY  2000   APPENDECTOMY     bone chip removed from left foot      CHOLECYSTECTOMY     COLONOSCOPY WITH PROPOFOL N/A 03/03/2017   Procedure: COLONOSCOPY WITH PROPOFOL;  Surgeon: Juanita Craver, MD;  Location: [facility];  Service: Endoscopy;  Laterality: N/A;   CORONARY ANGIOPLASTY WITH STENT PLACEMENT  10/99   ESOPHAGOGASTRODUODENOSCOPY (EGD) WITH PROPOFOL N/A 03/03/2017   Procedure: ESOPHAGOGASTRODUODENOSCOPY (EGD) WITH PROPOFOL;  Surgeon: Juanita Craver, MD;  Location: [facility] ENDOS[facility];  Service: Endoscopy;  Laterality: N/A;   HERNIA REPAIR     umbilical    HYSTEROSCOPY  2/98   D&C (polyps)   LEFT HEART CATHETERIZATION WITH CORONARY ANGIOGRAM N/A 01/25/2014   Procedure: LEFT HEART  CATHETERIZATION WITH CORONARY ANGIOGRAM;  Surgeon: Jettie Booze, MD;  Location: [facility] CATH LAB;  Service: Cardiovascular;  Laterality: N/A;   NODE DISSECTION     neg   TOTAL ABDOMINAL HYSTERECTOMY     LSO     Failed TVH     Current Outpatient Medications  Medication Sig Dispense Refill   acetaminophen (TYLENOL) 650 MG CR tablet Take 1,300 mg every 8 (eight) hours as needed by mouth for pain.     amLODipine (NORVASC) 10 MG tablet TAKE 1 TABLET BY MOUTH  DAILY 90 tablet 3   AMLODIPINE BENZOATE PO      AMLODIPINE-VALSARTAN-HCTZ PO      Aspirin 81 MG CAPS      atorvastatin (LIPITOR) 20 MG tablet TAKE 1 TABLET BY MOUTH  DAILY AT 6 PM 90 tablet 3   ATORVASTATIN CALCIUM PO      Biotin 5 MG CAPS      BIOTIN PO Take by mouth.     CINNAMON PO Take by mouth.     COVID-19 mRNA bivalent vaccine, Pfizer, (PFIZER COVID-19 VAC BIVALENT) injection Inject into the muscle. 0.3 mL 0   ELIQUIS 5 MG TABS tablet TAKE 1 TABLET  BY MOUTH  TWICE DAILY 180 tablet 1   ERGOCALCIFEROL PO      furosemide (LASIX) 20 MG tablet Take 20 mg by mouth as needed.     GLUCOSAMINE-CHONDROITIN DS PO      isosorbide mononitrate (IMDUR) 60 MG 24 hr tablet TAKE 1 TABLET BY MOUTH  DAILY 90 tablet 3   labetalol (NORMODYNE) 200 MG tablet TAKE 1 TABLET BY MOUTH  TWICE DAILY 180 tablet 3   lisinopril (ZESTRIL) 40 MG tablet TAKE 1 TABLET BY MOUTH  DAILY 90 tablet 3   mupirocin ointment (BACTROBAN) 2 % Apply topically daily.     nitroGLYCERIN (NITROSTAT) 0.4 MG SL tablet DISSOLVE 1 TABLET UNDER THE TONGUE EVERY 5 MINUTES AS  NEEDED FOR CHEST PAIN. MAX  OF 3 TABLETS IN 15 MINUTES. CALL 911 IF PAIN PERSISTS. 100 tablet 3   NON FORMULARY Pain cream for arthritis sent to [facility], faxed 11/01/2018 [facility]     pantoprazole (PROTONIX) 40 MG tablet Take 40 mg by mouth daily.     potassium chloride SA (KLOR-CON) 20 MEQ tablet Take 20 mEq by mouth as needed.     Soft Lens Products (REWETTING DROPS) SOLN Place 1 drop 3 (three) times daily as needed into both eyes (for dry/irritated contact lenses).     spironolactone (ALDACTONE) 25 MG tablet TAKE 1 TABLET BY MOUTH  DAILY 90 tablet 3   VITAMIN D PO Take 2,000 mg by mouth daily.     Vitamin D, Ergocalciferol, (DRISDOL) 1.25 MG (50000 UT) CAPS capsule TAKE 1 CAPSULE BY MOUTH WEEKLY, START VIT D 2,000 UNITS DAILY AFTER 60 DAYS     No current facility-administered medications for this visit.    Allergies:   Azithromycin, Other, Penicillins, Tetanus toxoids, Gadolinium derivatives, Morphine and related, Cefazolin, Latex, Penicillin g, and Amoxicillin-pot clavulanate    Social History:  The patient  reports that she has never smoked. She has never used smokeless tobacco. She reports current alcohol use of about 1.0 standard drink per week. She reports that she does not use drugs.   Family History:  The patient's family history includes Cancer in her father; Diabetes in her mother; Heart attack in her  father; Heart disease in her father; Hypertension in her father and mother; Stroke in her mother.  ROS:  Please see the history of present illness.   Otherwise, review of systems are positive for .   All other systems are reviewed and negative.    PHYSICAL EXAM: VS:  BP 108/60    Pulse 69    Ht 5\' 3"  (1.6 m)    Wt 202 lb 3.2 oz (91.7 kg)    SpO2 93%    BMI 35.82 kg/m  , BMI Body mass index is 35.82 kg/m. GEN: Well nourished, well developed, in no acute distress HEENT: normal Neck: no JVD, carotid bruits, or masses Cardiac: RRR; no murmurs, rubs, or gallops,no edema  Respiratory:  clear to auscultation bilaterally, normal work of breathing GI: soft, nontender, nondistended, + BS MS: no deformity or atrophy Skin: warm and dry, no rash Neuro:  Strength and sensation are intact Psych: euthymic mood, full affect   EKG:   The ekg ordered today demonstrates NSR, rSR'   Recent Labs: No results found for requested labs within last 8760 hours.   Lipid Panel No results found for: CHOL, TRIG, HDL, CHOLHDL, VLDL, LDLCALC, LDLDIRECT   Other studies Reviewed: Additional studies/ records that were reviewed today with results demonstrating: Labs reviewed.   ASSESSMENT AND PLAN:  CAD: Continue aggressive secondary prevention.  She had stents many years ago.  She had an episode of chest discomfort.  It has been sometime since she has had an ischemic evaluation.  We will plan for Lexiscan nuclear stress test. HTN: The current medical regimen is effective;  continue present plan and medications.  Low-salt diet.  Avoid processed foods. Chronic diastolic heart failure: Appears euvolemic.  Does report some dyspnea on exertion, fatigue.  Will check echocardiogram. Hyperlipidemia: LDL 80.  Continue atorvastatin.  Would consider increasing atorvastatin to 40 mg daily once her work-up is complete.   Current medicines are reviewed at length with the patient today.  The patient concerns regarding her  medicines were addressed.  The following changes have been made:  No change  Labs/ tests ordered today include:  No orders of the defined types were placed in this encounter.   Recommend 150 minutes/week of aerobic exercise Low fat, low carb, high fiber diet recommended  Disposition:   FU for stress test   Signed, Larae Grooms, MD  03/28/2021 3:45 PM    [facility] [address], [place], [place]  [address] Phone: [phone]; Fax: [phone]

## 2021-03-28 ENCOUNTER — Other Ambulatory Visit: Payer: Self-pay

## 2021-03-28 ENCOUNTER — Encounter: Payer: Self-pay | Admitting: *Deleted

## 2021-03-28 ENCOUNTER — Encounter: Payer: Self-pay | Admitting: Interventional Cardiology

## 2021-03-28 ENCOUNTER — Ambulatory Visit: Payer: Medicare Other | Admitting: Interventional Cardiology

## 2021-03-28 VITALS — BP 108/60 | HR 69 | Ht 63.0 in | Wt 202.2 lb

## 2021-03-28 DIAGNOSIS — I10 Essential (primary) hypertension: Secondary | ICD-10-CM | POA: Diagnosis not present

## 2021-03-28 DIAGNOSIS — I5032 Chronic diastolic (congestive) heart failure: Secondary | ICD-10-CM | POA: Diagnosis not present

## 2021-03-28 DIAGNOSIS — R0602 Shortness of breath: Secondary | ICD-10-CM

## 2021-03-28 DIAGNOSIS — I2583 Coronary atherosclerosis due to lipid rich plaque: Secondary | ICD-10-CM

## 2021-03-28 DIAGNOSIS — E782 Mixed hyperlipidemia: Secondary | ICD-10-CM | POA: Diagnosis not present

## 2021-03-28 DIAGNOSIS — R079 Chest pain, unspecified: Secondary | ICD-10-CM

## 2021-03-28 DIAGNOSIS — I2510 Atherosclerotic heart disease of native coronary artery without angina pectoris: Secondary | ICD-10-CM

## 2021-03-28 NOTE — Patient Instructions (Signed)
Medication Instructions:  Your physician recommends that you continue on your current medications as directed. Please refer to the Current Medication list given to you today.  *If you need a refill on your cardiac medications before your next appointment, please call your pharmacy*   Lab Work: none If you have labs (blood work) drawn today and your tests are completely normal, you will receive your results only by: [place] (if you have MyChart) OR A paper copy in the mail If you have any lab test that is abnormal or we need to change your treatment, we will call you to review the results.   Testing/Procedures: Your physician has requested that you have an echocardiogram. Echocardiography is a painless test that uses sound waves to create images of your heart. It provides your doctor with information about the size and shape of your heart and how well your hearts chambers and valves are working. This procedure takes approximately one hour. There are no restrictions for this procedure.  Your physician has requested that you have a lexiscan myoview. For further information please visit [email or web address]. Please follow instruction sheet, as given.    Follow-Up: At [facility], you and your health needs are our priority.  As part of our continuing mission to provide you with exceptional heart care, we have created designated Provider Care Teams.  These Care Teams include your primary Cardiologist (physician) and Advanced Practice Providers (APPs -  Physician Assistants and Nurse Practitioners) who all work together to provide you with the care you need, when you need it.  We recommend signing up for the patient portal called "MyChart".  Sign up information is provided on this After Visit Summary.  MyChart is used to connect with patients for Virtual Visits (Telemedicine).  Patients are able to view lab/test results, encounter notes, upcoming appointments, etc.  Non-urgent messages  can be sent to your provider as well.   To learn more about what you can do with MyChart, go to [email or web address].    Your next appointment:   12 month(s)  The format for your next appointment:   In Person  Provider:   Larae Grooms, MD     Other Instructions

## 2021-03-29 NOTE — Progress Notes (Signed)
Subjective: [name] is a 75 y.o. female patient seen today for follow up of  painful thick toenails that are difficult to trim. Pain interferes with ambulation. Aggravating factors include wearing enclosed shoe gear. Pain is relieved with periodic professional debridement.  New problems reported today: None.  PCP is Rankins, Bill Salinas, MD. Last visit was: 03/21/2021.  Allergies  Allergen Reactions   Azithromycin Swelling    Other reaction(s): swelling   Other Itching, Swelling, Other (See Comments) and Cough    Horse products Other reaction(s): reaction Other reaction(s): Unknown Other reaction(s): rash   Penicillins Swelling    Has patient had a PCN reaction causing immediate rash, facial/tongue/throat swelling, SOB or lightheadedness with hypotension: Yes Has patient had a PCN reaction causing severe rash involving mucus membranes or skin necrosis: No Has patient had a PCN reaction that required hospitalization: No Has patient had a PCN reaction occurring within the last 10 years: No If all of the above answers are "NO", then may proceed with Cephalosporin use.  Other reaction(s): rash   Tetanus Toxoids Swelling    Other reaction(s): sickness   Gadolinium Derivatives Hives and Itching    Pt stated that her left arm was itching and I noticed that she had two hives on her chest. No difficulty breathing, sneezing. One 25mg  of benadryl was ordered by Dr. Jimmye Norman. Pt remained at facility and was monitored before going home. -ldrake   Morphine And Related Nausea And Vomiting   Cefazolin     Other reaction(s): rash Other reaction(s): rash   Latex    Penicillin G     Other reaction(s): Unknown   Amoxicillin-Pot Clavulanate Rash    Has patient had a PCN reaction causing immediate rash, facial/tongue/throat swelling, SOB or lightheadedness with hypotension: Yes Has patient had a PCN reaction causing severe rash involving mucus membranes or skin necrosis: Yes Has patient had a  PCN reaction that required hospitalization:No Has patient had a PCN reaction occurring within the last 10 years: No If all of the above answers are "NO", then may proceed with Cephalosporin use.  Other reaction(s): rash, swelling    Objective: Physical Exam  General: Patient is a pleasant 75 y.o. Caucasian female in NAD. AAO x 3.   Neurovascular Examination: CFT immediate b/l LE. Palpable DP/PT pulses b/l LE. Digital hair sparse b/l. Skin temperature gradient WNL b/l. No pain with calf compression b/l. No edema noted b/l. No cyanosis or clubbing noted b/l LE.  Protective sensation intact 5/5 intact bilaterally with 10g monofilament b/l. Vibratory sensation intact b/l.  Dermatological:  Pedal integument with normal turgor, texture and tone b/l LE. No open wounds b/l. No interdigital macerations b/l. Toenails 1-5 b/l elongated, thickened, discolored with subungual debris. +Tenderness with dorsal palpation of nailplates. No hyperkeratotic or porokeratotic lesions present.  Musculoskeletal:  Normal muscle strength 5/5 to all lower extremity muscle groups bilaterally. No pain, crepitus or joint limitation noted with ROM b/l LE. No gross bony pedal deformities b/l. Patient ambulates independently without assistive aids.  Assessment: 1. Pain due to onychomycosis of toenails of both feet     Plan: Patient was evaluated and treated and all questions answered. Consent given for treatment as described below: -Examined patient. -Mycotic toenails 1-5 bilaterally were debrided in length and girth with sterile nail nippers and dremel without incident. -Patient/POA to call should there be question/concern in the interim.  Return in about 3 months (around 06/23/2021).  Marzetta Board, DPM

## 2021-04-03 ENCOUNTER — Telehealth (HOSPITAL_COMMUNITY): Payer: Self-pay | Admitting: *Deleted

## 2021-04-03 NOTE — Telephone Encounter (Signed)
Patient given detailed instructions per Myocardial Perfusion Study Information Sheet for the test on 04/11/21 at 10:45. Patient notified to arrive 15 minutes early and that it is imperative to arrive on time for appointment to keep from having the test rescheduled.  If you need to cancel or reschedule your appointment, please call the office within 24 hours of your appointment. . Patient verbalized understanding.[name]

## 2021-04-11 ENCOUNTER — Other Ambulatory Visit: Payer: Self-pay

## 2021-04-11 ENCOUNTER — Ambulatory Visit (HOSPITAL_COMMUNITY): Payer: Medicare Other | Attending: Interventional Cardiology

## 2021-04-11 DIAGNOSIS — I2510 Atherosclerotic heart disease of native coronary artery without angina pectoris: Secondary | ICD-10-CM | POA: Diagnosis present

## 2021-04-11 DIAGNOSIS — R079 Chest pain, unspecified: Secondary | ICD-10-CM | POA: Diagnosis not present

## 2021-04-11 DIAGNOSIS — I2583 Coronary atherosclerosis due to lipid rich plaque: Secondary | ICD-10-CM | POA: Insufficient documentation

## 2021-04-11 DIAGNOSIS — R0602 Shortness of breath: Secondary | ICD-10-CM | POA: Diagnosis not present

## 2021-04-11 LAB — MYOCARDIAL PERFUSION IMAGING
LV dias vol: 66 mL (ref 46–106)
LV sys vol: 19 mL
Nuc Stress EF: 71 %
Peak HR: 71 {beats}/min
Rest HR: 56 {beats}/min
Rest Nuclear Isotope Dose: 10.9 mCi
SDS: 1
SRS: 0
SSS: 1
ST Depression (mm): 0 mm
Stress Nuclear Isotope Dose: 33 mCi
TID: 1.08

## 2021-04-11 MED ORDER — REGADENOSON 0.4 MG/5ML IV SOLN
0.4000 mg | Freq: Once | INTRAVENOUS | Status: AC
  Administered 2021-04-11: 0.4 mg via INTRAVENOUS

## 2021-04-11 MED ORDER — TECHNETIUM TC 99M TETROFOSMIN IV KIT
33.0000 | PACK | Freq: Once | INTRAVENOUS | Status: AC | PRN
  Administered 2021-04-11: 12:00:00 33 via INTRAVENOUS
  Filled 2021-04-11: qty 33

## 2021-04-11 MED ORDER — TECHNETIUM TC 99M TETROFOSMIN IV KIT
10.9000 | PACK | Freq: Once | INTRAVENOUS | Status: AC | PRN
  Administered 2021-04-11: 10.9 via INTRAVENOUS
  Filled 2021-04-11: qty 11

## 2021-04-17 ENCOUNTER — Ambulatory Visit (HOSPITAL_COMMUNITY): Payer: Medicare Other | Attending: Cardiology

## 2021-04-17 DIAGNOSIS — R079 Chest pain, unspecified: Secondary | ICD-10-CM

## 2021-04-17 DIAGNOSIS — I2510 Atherosclerotic heart disease of native coronary artery without angina pectoris: Secondary | ICD-10-CM

## 2021-04-17 DIAGNOSIS — I2583 Coronary atherosclerosis due to lipid rich plaque: Secondary | ICD-10-CM | POA: Insufficient documentation

## 2021-04-17 DIAGNOSIS — I5032 Chronic diastolic (congestive) heart failure: Secondary | ICD-10-CM

## 2021-04-17 DIAGNOSIS — R0602 Shortness of breath: Secondary | ICD-10-CM | POA: Diagnosis not present

## 2021-04-17 LAB — ECHOCARDIOGRAM COMPLETE
Area-P 1/2: 3.12 cm2
S' Lateral: 2.7 cm

## 2021-04-25 ENCOUNTER — Other Ambulatory Visit: Payer: Self-pay | Admitting: Interventional Cardiology

## 2021-05-28 ENCOUNTER — Other Ambulatory Visit: Payer: Self-pay | Admitting: Interventional Cardiology

## 2021-07-11 ENCOUNTER — Other Ambulatory Visit: Payer: Self-pay | Admitting: Interventional Cardiology

## 2021-07-11 DIAGNOSIS — I4891 Unspecified atrial fibrillation: Secondary | ICD-10-CM

## 2021-07-11 NOTE — Telephone Encounter (Signed)
Eliquis '5mg'$  refill request received. Patient is 76 years old, weight-91.6kg, Crea-1.14 on 01/24/2019-NEED LABS, Diagnosis-Afib per Dr. Theodosia Blender note on 07/10/2018 it states "Hospitalized in [place] in January with afib and PNA and started on diuretics and anticoagulation" & per 07/12/2018 hospital discharge summary it states "Afib on Eliquis", and last seen by Dr. Irish Lack on 03/28/2021. Dose is appropriate based on dosing criteria. Will send in refill to requested pharmacy.   ? ?CHECK [facility] FOR LABS ?

## 2021-07-11 NOTE — Telephone Encounter (Signed)
Prescription refill request for Eliquis received. ?Indication: Afib per Dr. Theodosia Blender note on 07/10/2018 it states "Hospitalized in [place] in January with afib and PNA and started on diuretics and anticoagulation" & per 07/12/2018 hospital discharge summary it states "Afib on Eliquis",  ?Last office visit: 03/28/21 Irish Lack)  ?Scr: 1.49 (03/18/21 via [facility])  ?Age: 76 ?Weight: 91.6kg ? ?Appropriate dose and refill sent to requested pharmacy.  ?

## 2021-07-15 DIAGNOSIS — M1990 Unspecified osteoarthritis, unspecified site: Secondary | ICD-10-CM | POA: Diagnosis not present

## 2021-07-15 DIAGNOSIS — N183 Chronic kidney disease, stage 3 unspecified: Secondary | ICD-10-CM | POA: Diagnosis not present

## 2021-07-15 DIAGNOSIS — I10 Essential (primary) hypertension: Secondary | ICD-10-CM | POA: Diagnosis not present

## 2021-07-15 DIAGNOSIS — H9190 Unspecified hearing loss, unspecified ear: Secondary | ICD-10-CM | POA: Diagnosis not present

## 2021-07-15 DIAGNOSIS — I5032 Chronic diastolic (congestive) heart failure: Secondary | ICD-10-CM | POA: Diagnosis not present

## 2021-07-16 ENCOUNTER — Encounter: Payer: Self-pay | Admitting: Podiatry

## 2021-07-16 ENCOUNTER — Ambulatory Visit: Payer: Medicare Other | Admitting: Podiatry

## 2021-07-16 DIAGNOSIS — L600 Ingrowing nail: Secondary | ICD-10-CM | POA: Diagnosis not present

## 2021-07-16 DIAGNOSIS — M79674 Pain in right toe(s): Secondary | ICD-10-CM | POA: Diagnosis not present

## 2021-07-16 DIAGNOSIS — M79675 Pain in left toe(s): Secondary | ICD-10-CM | POA: Diagnosis not present

## 2021-07-16 DIAGNOSIS — B351 Tinea unguium: Secondary | ICD-10-CM

## 2021-07-16 DIAGNOSIS — H9190 Unspecified hearing loss, unspecified ear: Secondary | ICD-10-CM | POA: Insufficient documentation

## 2021-07-21 NOTE — Progress Notes (Signed)
?Subjective:  ?Patient ID: [name], female    DOB: 1945/08/25,  MRN: [id number] ? ?CHYANNE KOHUT presents to clinic today for painful elongated mycotic toenails 1-5 bilaterally which are tender when wearing enclosed shoe gear. Pain is relieved with periodic professional debridement. ? ?Patient states her left great toe is sore on today's visit. Points to medial border. Has been tender when wearing enclosed shoe gear. Denies any drainage or swelling. She has not attempted any treatment. ? ?PCP is Rankins, Bill Salinas, MD , and last visit was July 15, 2021. ? ?Allergies  ?Allergen Reactions  ? Azithromycin Swelling  ?  Other reaction(s): swelling  ? Other Itching, Swelling, Other (See Comments) and Cough  ?  Horse products ?Other reaction(s): reaction ?Other reaction(s): Unknown ?Other reaction(s): rash  ? Penicillins Swelling  ?  Has patient had a PCN reaction causing immediate rash, facial/tongue/throat swelling, SOB or lightheadedness with hypotension: Yes ?Has patient had a PCN reaction causing severe rash involving mucus membranes or skin necrosis: No ?Has patient had a PCN reaction that required hospitalization: No ?Has patient had a PCN reaction occurring within the last 10 years: No ?If all of the above answers are "NO", then may proceed with Cephalosporin use. ? ?Other reaction(s): rash ?Other reaction(s): rash  ? Tetanus Toxoids Swelling  ?  Other reaction(s): sickness  ? Gadolinium Derivatives Hives and Itching  ?  Pt stated that her left arm was itching and I noticed that she had two hives on her chest. No difficulty breathing, sneezing. One '25mg'$  of benadryl was ordered by Dr. Jimmye Norman. Pt remained at facility and was monitored before going home. -ldrake  ? Morphine And Related Nausea And Vomiting  ? Cefazolin   ?  Other reaction(s): rash ?Other reaction(s): rash  ? Latex   ? Penicillin G   ?  Other reaction(s): Unknown  ? Amoxicillin-Pot Clavulanate Rash  ?  Has patient had a PCN reaction  causing immediate rash, facial/tongue/throat swelling, SOB or lightheadedness with hypotension: Yes ?Has patient had a PCN reaction causing severe rash involving mucus membranes or skin necrosis: Yes ?Has patient had a PCN reaction that required hospitalization:No ?Has patient had a PCN reaction occurring within the last 10 years: No ?If all of the above answers are "NO", then may proceed with Cephalosporin use. ? ?Other reaction(s): rash, swelling  ? ? ?Review of Systems: Negative except as noted in the HPI. ? ?Objective: ?General: Patient is a pleasant 76 y.o. Caucasian female in NAD. AAO x 3.  ? ?Neurovascular Examination: ?CFT immediate b/l LE. Palpable DP/PT pulses b/l LE. Digital hair sparse b/l. Skin temperature gradient WNL b/l. No pain with calf compression b/l. No edema noted b/l. No cyanosis or clubbing noted b/l LE. ? ?Protective sensation intact 5/5 intact bilaterally with 10g monofilament b/l. Vibratory sensation intact b/l. ? ?Dermatological:  ?Pedal integument with normal turgor, texture and tone b/l LE. No open wounds b/l. No interdigital macerations b/l. Toenails 1-5 b/l elongated, thickened, discolored with subungual debris. +Tenderness with dorsal palpation of nailplates. Incurvated nailplate left great toe medial border(s) with tenderness to palpation. No erythema, no edema, no drainage noted. No hyperkeratotic or porokeratotic lesions present. ? ?Musculoskeletal:  ?Normal muscle strength 5/5 to all lower extremity muscle groups bilaterally. No pain, crepitus or joint limitation noted with ROM b/l LE. No gross bony pedal deformities b/l. Patient ambulates independently without assistive aids. ? ?Assessment/Plan: ?No diagnosis found.  ? ?-Patient was evaluated and treated. All patient's and/or POA's questions/concerns answered  on today's visit. ?-Toenails 2-5 bilaterally and R hallux debrided in length and girth without iatrogenic bleeding with sterile nail nipper and dremel.  ?-Offending nail  border debrided and curretaged left great toe utilizing sterile nail nipper and currette. Border(s) cleansed with alcohol and TAO applied. Patient instructed to apply triple antibiotic ointment  to left great toe once daily for 7 days. Advised Mrs. Blundell she may need an ingrown toenail procedure performed should this not relieve her symptoms or if they become worse. Patient advised to call office if symptoms worsen. She related understanding. ?-Patient/POA to call should there be question/concern in the interim.  ? ?Return in about 3 months (around 10/15/2021). ? ?Marzetta Board, DPM  ?

## 2021-08-06 DIAGNOSIS — E7800 Pure hypercholesterolemia, unspecified: Secondary | ICD-10-CM | POA: Diagnosis not present

## 2021-08-06 DIAGNOSIS — N183 Chronic kidney disease, stage 3 unspecified: Secondary | ICD-10-CM | POA: Diagnosis not present

## 2021-08-06 DIAGNOSIS — R7303 Prediabetes: Secondary | ICD-10-CM | POA: Diagnosis not present

## 2021-08-06 DIAGNOSIS — I2510 Atherosclerotic heart disease of native coronary artery without angina pectoris: Secondary | ICD-10-CM | POA: Diagnosis not present

## 2021-08-06 DIAGNOSIS — I10 Essential (primary) hypertension: Secondary | ICD-10-CM | POA: Diagnosis not present

## 2021-08-19 DIAGNOSIS — K219 Gastro-esophageal reflux disease without esophagitis: Secondary | ICD-10-CM | POA: Diagnosis not present

## 2021-08-19 DIAGNOSIS — N183 Chronic kidney disease, stage 3 unspecified: Secondary | ICD-10-CM | POA: Diagnosis not present

## 2021-08-19 DIAGNOSIS — I2510 Atherosclerotic heart disease of native coronary artery without angina pectoris: Secondary | ICD-10-CM | POA: Diagnosis not present

## 2021-08-19 DIAGNOSIS — H9190 Unspecified hearing loss, unspecified ear: Secondary | ICD-10-CM | POA: Diagnosis not present

## 2021-08-19 DIAGNOSIS — G4730 Sleep apnea, unspecified: Secondary | ICD-10-CM | POA: Diagnosis not present

## 2021-08-19 DIAGNOSIS — E7800 Pure hypercholesterolemia, unspecified: Secondary | ICD-10-CM | POA: Diagnosis not present

## 2021-08-19 DIAGNOSIS — I5032 Chronic diastolic (congestive) heart failure: Secondary | ICD-10-CM | POA: Diagnosis not present

## 2021-08-19 DIAGNOSIS — M1990 Unspecified osteoarthritis, unspecified site: Secondary | ICD-10-CM | POA: Diagnosis not present

## 2021-08-19 DIAGNOSIS — I10 Essential (primary) hypertension: Secondary | ICD-10-CM | POA: Diagnosis not present

## 2021-08-23 DIAGNOSIS — M19072 Primary osteoarthritis, left ankle and foot: Secondary | ICD-10-CM | POA: Diagnosis not present

## 2021-08-23 DIAGNOSIS — M19071 Primary osteoarthritis, right ankle and foot: Secondary | ICD-10-CM | POA: Diagnosis not present

## 2021-09-05 DIAGNOSIS — M170 Bilateral primary osteoarthritis of knee: Secondary | ICD-10-CM | POA: Diagnosis not present

## 2021-09-12 DIAGNOSIS — M170 Bilateral primary osteoarthritis of knee: Secondary | ICD-10-CM | POA: Diagnosis not present

## 2021-09-13 DIAGNOSIS — M19071 Primary osteoarthritis, right ankle and foot: Secondary | ICD-10-CM | POA: Diagnosis not present

## 2021-09-13 DIAGNOSIS — M19072 Primary osteoarthritis, left ankle and foot: Secondary | ICD-10-CM | POA: Diagnosis not present

## 2021-09-13 DIAGNOSIS — M1909 Primary osteoarthritis, other specified site: Secondary | ICD-10-CM | POA: Diagnosis not present

## 2021-09-16 ENCOUNTER — Ambulatory Visit: Payer: Medicare Other | Admitting: Podiatry

## 2021-09-16 DIAGNOSIS — B351 Tinea unguium: Secondary | ICD-10-CM

## 2021-09-16 DIAGNOSIS — L600 Ingrowing nail: Secondary | ICD-10-CM

## 2021-09-16 DIAGNOSIS — M79674 Pain in right toe(s): Secondary | ICD-10-CM | POA: Diagnosis not present

## 2021-09-16 DIAGNOSIS — M79675 Pain in left toe(s): Secondary | ICD-10-CM | POA: Diagnosis not present

## 2021-09-18 DIAGNOSIS — E785 Hyperlipidemia, unspecified: Secondary | ICD-10-CM | POA: Diagnosis not present

## 2021-09-18 DIAGNOSIS — N1832 Chronic kidney disease, stage 3b: Secondary | ICD-10-CM | POA: Diagnosis not present

## 2021-09-18 DIAGNOSIS — N390 Urinary tract infection, site not specified: Secondary | ICD-10-CM | POA: Diagnosis not present

## 2021-09-18 DIAGNOSIS — I129 Hypertensive chronic kidney disease with stage 1 through stage 4 chronic kidney disease, or unspecified chronic kidney disease: Secondary | ICD-10-CM | POA: Diagnosis not present

## 2021-09-18 DIAGNOSIS — I5032 Chronic diastolic (congestive) heart failure: Secondary | ICD-10-CM | POA: Diagnosis not present

## 2021-09-19 DIAGNOSIS — M170 Bilateral primary osteoarthritis of knee: Secondary | ICD-10-CM | POA: Diagnosis not present

## 2021-09-21 ENCOUNTER — Encounter: Payer: Self-pay | Admitting: Podiatry

## 2021-09-21 NOTE — Progress Notes (Signed)
Subjective:  Patient ID: [name], female    DOB: Apr 15, 1945,  MRN: [id number]  [name] presents to clinic today for painful elongated mycotic toenails 1-5 bilaterally which are tender when wearing enclosed shoe gear. Pain is relieved with periodic professional debridement.  Patient states left great toe is tender again. Medial border is symptomatic. Denies any redness, drainage or swelling, but it tender when pressure applied.  PCP is Rankins, Bill Salinas, MD , and last visit was May, 2023.  Allergies  Allergen Reactions   Azithromycin Swelling    Other reaction(s): swelling   Other Itching, Swelling, Other (See Comments) and Cough    Horse products Other reaction(s): reaction Other reaction(s): Unknown Other reaction(s): rash   Penicillins Swelling    Has patient had a PCN reaction causing immediate rash, facial/tongue/throat swelling, SOB or lightheadedness with hypotension: Yes Has patient had a PCN reaction causing severe rash involving mucus membranes or skin necrosis: No Has patient had a PCN reaction that required hospitalization: No Has patient had a PCN reaction occurring within the last 10 years: No If all of the above answers are "NO", then may proceed with Cephalosporin use.  Other reaction(s): rash Other reaction(s): rash   Tetanus Toxoids Swelling    Other reaction(s): sickness   Gadolinium Derivatives Hives and Itching    Pt stated that her left arm was itching and I noticed that she had two hives on her chest. No difficulty breathing, sneezing. One '25mg'$  of benadryl was ordered by Dr. Jimmye Norman. Pt remained at facility and was monitored before going home. -ldrake   Morphine And Related Nausea And Vomiting   Cefazolin     Other reaction(s): rash Other reaction(s): rash   Latex    Penicillin G     Other reaction(s): Unknown   Amoxicillin-Pot Clavulanate Rash    Has patient had a PCN reaction causing immediate rash, facial/tongue/throat swelling, SOB  or lightheadedness with hypotension: Yes Has patient had a PCN reaction causing severe rash involving mucus membranes or skin necrosis: Yes Has patient had a PCN reaction that required hospitalization:No Has patient had a PCN reaction occurring within the last 10 years: No If all of the above answers are "NO", then may proceed with Cephalosporin use.  Other reaction(s): rash, swelling    Review of Systems: Negative except as noted in the HPI.  Objective: No changes noted in today's physical examination.  General: Patient is a pleasant 76 y.o. Caucasian female in NAD. AAO x 3.   Neurovascular Examination: CFT immediate b/l LE. Palpable DP/PT pulses b/l LE. Digital hair sparse b/l. Skin temperature gradient WNL b/l. No pain with calf compression b/l. No edema noted b/l. No cyanosis or clubbing noted b/l LE.  Protective sensation intact 5/5 intact bilaterally with 10g monofilament b/l. Vibratory sensation intact b/l.  Dermatological:  Pedal integument with normal turgor, texture and tone b/l LE. No open wounds b/l. No interdigital macerations b/l. Toenails 1-5 b/l elongated, thickened, discolored with subungual debris. +Tenderness with dorsal palpation of nailplates.   Incurvated nailplate left great toe medial border(s) with tenderness to palpation. No erythema, no edema, no drainage noted. No hyperkeratotic or porokeratotic lesions present.  Musculoskeletal:  Normal muscle strength 5/5 to all lower extremity muscle groups bilaterally. No pain, crepitus or joint limitation noted with ROM b/l LE. No gross bony pedal deformities b/l. Patient ambulates independently without assistive aids.  Assessment/Plan: 1. Pain due to onychomycosis of toenails of both feet   2. Ingrown toenail without infection     -  Patient was evaluated and treated. All patient's and/or POA's questions/concerns answered on today's visit. -Discussed chronicity of ingrown toenail and recommended matrixectomy of left  great toe medial border. Patient will think about it. -Toenails 2-5 bilaterally and R hallux debrided in length and girth without iatrogenic bleeding with sterile nail nipper and dremel.  -Offending nail border debrided and curretaged medial border left hallux utilizing sterile nail nipper and currette. Border(s) cleansed with alcohol and triple antibiotic ointment applied. Patient instructed to apply Neosporin Cream  to left great toe once daily for 7 days. Call office if there are any concerns. -Patient/POA to call should there be question/concern in the interim.   Return in about 9 weeks (around 11/18/2021).  Marzetta Board, DPM

## 2021-09-24 ENCOUNTER — Telehealth: Payer: Self-pay | Admitting: *Deleted

## 2021-09-24 NOTE — Telephone Encounter (Signed)
-----   Message from Jettie Booze, MD sent at 09/23/2021  5:31 PM EDT ----- I spoke with nephrologist about stopping amlodipine to avoid hypotension  Let us know if BP increases off of amlodipine.   JV ----- Message ----- From: Audria Nine Sent: 09/23/2021   1:45 PM EDT To: Jettie Booze, MD; #

## 2021-09-24 NOTE — Telephone Encounter (Signed)
Patient notified.  She stopped amlodipine about 3 days ago. Patient is asking for samples of Eliquis.  She states it is very expensive and she does not qualify for assistance program.

## 2021-09-24 NOTE — Telephone Encounter (Signed)
I spoke with patient's husband and told him there would be 2 boxes (28 tablets) of Eliquis 5 mg left at front desk for patient to pick up.  Husband made aware we cannot continue to give samples and patient will need to switch to warfarin if she cannot afford Eliquis long term

## 2021-09-28 ENCOUNTER — Other Ambulatory Visit: Payer: Self-pay | Admitting: Interventional Cardiology

## 2021-09-28 DIAGNOSIS — I4891 Unspecified atrial fibrillation: Secondary | ICD-10-CM

## 2021-09-30 DIAGNOSIS — H6121 Impacted cerumen, right ear: Secondary | ICD-10-CM | POA: Diagnosis not present

## 2021-09-30 DIAGNOSIS — H9193 Unspecified hearing loss, bilateral: Secondary | ICD-10-CM | POA: Insufficient documentation

## 2021-09-30 DIAGNOSIS — H93293 Other abnormal auditory perceptions, bilateral: Secondary | ICD-10-CM | POA: Diagnosis not present

## 2021-09-30 NOTE — Telephone Encounter (Signed)
Prescription refill request for Eliquis received. Indication:CAD Last office visit:12/22 Scr:1.6 Age: 76 Weight:91.6 kg  Prescription refilled

## 2021-10-17 DIAGNOSIS — N1832 Chronic kidney disease, stage 3b: Secondary | ICD-10-CM | POA: Diagnosis not present

## 2021-10-21 ENCOUNTER — Encounter: Payer: Self-pay | Admitting: Internal Medicine

## 2021-10-21 ENCOUNTER — Ambulatory Visit: Payer: Medicare Other | Admitting: Internal Medicine

## 2021-10-21 ENCOUNTER — Telehealth: Payer: Self-pay | Admitting: Interventional Cardiology

## 2021-10-21 VITALS — BP 110/62 | HR 67 | Ht 63.0 in | Wt 184.6 lb

## 2021-10-21 DIAGNOSIS — I700 Atherosclerosis of aorta: Secondary | ICD-10-CM | POA: Diagnosis not present

## 2021-10-21 DIAGNOSIS — I10 Essential (primary) hypertension: Secondary | ICD-10-CM | POA: Diagnosis not present

## 2021-10-21 DIAGNOSIS — E785 Hyperlipidemia, unspecified: Secondary | ICD-10-CM | POA: Diagnosis not present

## 2021-10-21 DIAGNOSIS — I5032 Chronic diastolic (congestive) heart failure: Secondary | ICD-10-CM

## 2021-10-21 DIAGNOSIS — R42 Dizziness and giddiness: Secondary | ICD-10-CM

## 2021-10-21 DIAGNOSIS — I25118 Atherosclerotic heart disease of native coronary artery with other forms of angina pectoris: Secondary | ICD-10-CM

## 2021-10-21 MED ORDER — ISOSORBIDE MONONITRATE ER 30 MG PO TB24: 30.0000 mg | ORAL_TABLET | Freq: Every day | ORAL | 3 refills | Status: DC

## 2021-10-21 MED ORDER — LISINOPRIL 20 MG PO TABS: 20.0000 mg | ORAL_TABLET | Freq: Every day | ORAL | 3 refills | Status: DC

## 2021-10-21 NOTE — Telephone Encounter (Signed)
Patient contact [facility] Triage, Pt called back per NO telephone details shared.   Pt stated she has NO PCP, PCP retired. Pt states has felt faint in the mornings the last 4-5 days.  Pt stated she thought it was her medications, and stopped taking ALL of them...Marland Kitchen??   Pt told to sit down, and had patient check her BP and HR at home and tell me the numbers.  Her first reading at 918 am was 154/90, HR 75, and second reading 4 minutes later was 143/87, HR 70.    Pt stated she was asymptomatic at time of call, but stated she has lost 40 lbs.  It has been over 4 months since seeing her PCP.  Pt educated and told NOT to hard stop taking her Eliquis.   Per information acquired, Pt scheduled to see DOD, Dr. Ali Lowe at 1100 am today to assess patient and discuss symptom / medication management vs weight loss.    Pt not available at time called back;  Pt husband, Mr. Lavender Stanke made aware of his wife's DOD appointment with [facility].   Husband stated she will be there.  F/u req.

## 2021-10-21 NOTE — Patient Instructions (Signed)
Medication Instructions:  Your physician has recommended you make the following change in your medication:   1) DECREASE Lisinopril to '20mg'$  at bedtime 2) DECREASE Imdur to '30mg'$  at bedtime  *If you need a refill on your cardiac medications before your next appointment, please call your pharmacy*  Lab Work: NONE  Testing/Procedures: NONE  Follow-Up: At [facility], you and your health needs are our priority.  As part of our continuing mission to provide you with exceptional heart care, we have created designated Provider Care Teams.  These Care Teams include your primary Cardiologist (physician) and Advanced Practice Providers (APPs -  Physician Assistants and Nurse Practitioners) who all work together to provide you with the care you need, when you need it.  Your next appointment:   5 month(s)  The format for your next appointment:   In Person  Provider:   Larae Grooms, MD {   Important Information About Sugar

## 2021-10-21 NOTE — Telephone Encounter (Signed)
   Pt is requesting to speak with Dr. Hassell Done nurse. She did not gave any details

## 2021-10-21 NOTE — Progress Notes (Signed)
Cardiology Office Note:    Date:  10/21/2021   ID:  [name], DOB 05-26-1945, MRN [id number]  PCP:  Aretta Nip, MD   [place] [facility] Cardiologist:  Lenna Sciara, MD Referring MD: Aretta Nip, MD   Chief Complaint/Reason for Referral: Presyncope  ASSESSMENT:    1. Dizziness   2. Atherosclerosis of native coronary artery of native heart with other form of angina pectoris ([place])   3. Hyperlipidemia, unspecified hyperlipidemia type   4. Essential hypertension   5. Chronic diastolic heart failure ([place])   6. Aortic atherosclerosis ([facility])     PLAN:    In order of problems listed above: 1.  Dizziness and presyncope: We will decrease Imdur to 30 mg at bedtime and change lisinopril to 20 mg at bedtime.  Patient will follow-up with Dr. Irish Lack in December as scheduled. 2.  Coronary artery disease: The patient is having no episodes of chest pain.  Decrease Imdur to 30 mg at bedtime.  Continue Eliquis in lieu of aspirin and atorvastatin. 3.  Hyperlipidemia: Continue atorvastatin with goal LDL less than 70. 4.  Hypertension: Blood pressures well controlled today.  We will decrease lisinopril to 20 mg at bedtime and decrease Imdur to 30 mg at bedtime. 5.  Chronic diastolic heart failure: The patient is euvolemic on exam today. 6.  Aortic atherosclerosis: Continue asin, atorvastatin, and strict blood pressure control. 7.  Paroxysmal atrial fibrillation: Continue Eliquis.  I did have a discussion with the patient regarding her repeated requests for samples for Eliquis.  If this becomes an issue she will need to change to Coumadin.  She has enough Eliquis to get her to her next appointment in December.  This should be addressed at that point in time.  She is not completely against starting Coumadin but due to her finances and due to the fact Eliquis has been working well she would like to try to remain on Eliquis.  Alternatively perhaps a monitor could be placed to  the evaluate her burden of atrial fibrillation and the need for anticoagulation.  It looks like she had limited atrial fibrillation some years ago.  However she does have atrial enlargement which anatomically does put her at risk for atrial fibrillation.             Dispo: In December as previously scheduled     Medication Adjustments/Labs and Tests Ordered: Current medicines are reviewed at length with the patient today.  Concerns regarding medicines are outlined above.  The following changes have been made:     Labs/tests ordered: No orders of the defined types were placed in this encounter.   Medication Changes: Meds ordered this encounter  Medications   isosorbide mononitrate (IMDUR) 30 MG 24 hr tablet    Sig: Take 1 tablet (30 mg total) by mouth daily.    Dispense:  90 tablet    Refill:  3    Dose change   lisinopril (ZESTRIL) 20 MG tablet    Sig: Take 1 tablet (20 mg total) by mouth daily.    Dispense:  90 tablet    Refill:  3    Dose change   Current medicines are reviewed at length with the patient today.  The patient does not have concerns regarding medicines.   History of Present Illness:    FOCUSED PROBLEM LIST:   1.  Coronary artery disease status post PCI in 2002 in 1999; last cath 2015 without obstructive disease 2.  Hypertension 3.  Hyperlipidemia 4.  Chronic diastolic heart failure 5.  Paroxysmal atrial fibrillation on Eliquis  The patient is a 76 y.o. female with the indicated medical history here for for expedited visit due to presyncope.  I am seeing the patient as the doctor of the day as the patient normally follows with Dr. Irish Lack.  The patient was last seen by him in December 2022.  At that point in time she had an episode of chest pain.  She was referred for nuclear stress test which was unremarkable.  An echocardiogram was also reassuring aside from biatrial enlargement.   Current Medications: Current Meds  Medication Sig   Aspirin 81 MG  CAPS    atorvastatin (LIPITOR) 20 MG tablet TAKE 1 TABLET BY MOUTH  DAILY AT 6 PM   ATORVASTATIN CALCIUM PO    Biotin 5 MG CAPS    ELIQUIS 5 MG TABS tablet TAKE 1 TABLET BY MOUTH TWICE  DAILY   estradiol (ESTRACE) 0.5 MG tablet    fluocinonide cream (LIDEX) 2.42 % Apply 1 application. topically 2 (two) times daily.   furosemide (LASIX) 20 MG tablet Take 20 mg by mouth as needed.   GLUCOSAMINE-CHONDROITIN DS PO Take 2,000 mg by mouth daily at 12 noon.   hydrocortisone 2.5 % cream SMARTSIG:sparingly Topical Twice Daily   isosorbide mononitrate (IMDUR) 30 MG 24 hr tablet Take 1 tablet (30 mg total) by mouth daily.   labetalol (NORMODYNE) 200 MG tablet TAKE 1 TABLET BY MOUTH  TWICE DAILY   lisinopril (ZESTRIL) 20 MG tablet Take 1 tablet (20 mg total) by mouth daily.   mometasone (ELOCON) 0.1 % cream Apply 1 application. topically daily.   nitroGLYCERIN (NITROSTAT) 0.4 MG SL tablet DISSOLVE 1 TABLET UNDER THE TONGUE EVERY 5 MINUTES AS  NEEDED FOR CHEST PAIN. MAX  OF 3 TABLETS IN 15 MINUTES. CALL 911 IF PAIN PERSISTS.   NON FORMULARY Pain cream for arthritis sent to [facility], faxed 11/01/2018 [id number]   Soft Lens Products (REWETTING DROPS) SOLN Place 1 drop 3 (three) times daily as needed into both eyes (for dry/irritated contact lenses).   spironolactone (ALDACTONE) 25 MG tablet TAKE 1 TABLET BY MOUTH  DAILY   VITAMIN D PO Take 2,000 mg by mouth daily.   Vitamin D, Ergocalciferol, (DRISDOL) 1.25 MG (50000 UT) CAPS capsule TAKE 1 CAPSULE BY MOUTH WEEKLY, START VIT D 2,000 UNITS DAILY AFTER 60 DAYS   [DISCONTINUED] isosorbide mononitrate (IMDUR) 60 MG 24 hr tablet TAKE 1 TABLET BY MOUTH  DAILY   [DISCONTINUED] lisinopril (ZESTRIL) 40 MG tablet TAKE 1 TABLET BY MOUTH  DAILY     Allergies:    Azithromycin, Other, Penicillins, Tetanus toxoids, Gadolinium derivatives, Morphine and related, Cefazolin, Latex, Penicillin g, and Amoxicillin-pot clavulanate   Social History:   Social History    Tobacco Use   Smoking status: Never   Smokeless tobacco: Never  Substance Use Topics   Alcohol use: Yes    Alcohol/week: 1.0 standard drink of alcohol    Types: 1 Standard drinks or equivalent per week    Comment: occ glass of wine   Drug use: No    Frequency: 5.0 times per week     Family Hx: Family History  Problem Relation Age of Onset   Diabetes Mother    Stroke Mother    Hypertension Mother    Heart disease Father    Hypertension Father    Cancer Father        pancreatic cancer   Heart attack  Father      Review of Systems:   Please see the history of present illness.    All other systems reviewed and are negative.     EKGs/Labs/Other Test Reviewed:    EKG:  EKG performed December 2022 that I personally reviewed demonstrates this rhythm with incomplete right bundle branch block.  Prior CV studies:  Echocardiogram 2023: Preserved LV function with ejection fraction of 60 to 65% with grade 1 diastolic dysfunction with biatrial enlargement and no significant valvular abnormalities with evidence of severe MAC  Lexiscan stress 2022: Low risk  Cardiac catheterization 2015: Patent stents in the LAD with minimal in-stent restenosis without significant obstructions elsewhere  Other studies Reviewed: Review of the additional studies/records demonstrates: Chest CT 2020 demonstrates three-vessel coronary artery atherosclerosis as well as aortic atherosclerosis  Recent Labs: No results found for requested labs within last 365 days.   Recent Lipid Panel No results found for: "CHOL", "TRIG", "HDL", "LDLCALC", "LDLDIRECT"  Risk Assessment/Calculations:     CHA2DS2-VASc Score = 4   This indicates a 4.8% annual risk of stroke. The patient's score is based upon: CHF History: 1 HTN History: 0 Diabetes History: 0 Stroke History: 0 Vascular Disease History: 0 Age Score: 2 Gender Score: 1         Physical Exam:    VS:  BP 110/62   Pulse 67   Ht _0  (1.6 m)    Wt 184 lb 9.6 oz (83.7 kg)   SpO2 100%   BMI 32.70 kg/m    Wt Readings from Last 3 Encounters:  10/21/21 184 lb 9.6 oz (83.7 kg)  04/11/21 202 lb (91.6 kg)  03/28/21 202 lb 3.2 oz (91.7 kg)    GENERAL:  No apparent distress, AOx3 HEENT:  No carotid bruits, +2 carotid impulses, no scleral icterus CAR: RRR no murmurs, gallops, rubs, or thrills RES:  Clear to auscultation bilaterally ABD:  Soft, nontender, nondistended, positive bowel sounds x 4 VASC:  +2 radial pulses, +2 carotid pulses, palpable pedal pulses NEURO:  CN 2-12 grossly intact; motor and sensory grossly intact PSYCH:  No active depression or anxiety EXT:  No edema, ecchymosis, or cyanosis  Signed, Early Osmond, MD  10/21/2021 11:35 AM    [address], [place], [place]  [address] Phone: [phone]; Fax: [phone]   Note:  This document was prepared using Dragon voice recognition software and may include unintentional dictation errors.

## 2021-11-05 DIAGNOSIS — M1990 Unspecified osteoarthritis, unspecified site: Secondary | ICD-10-CM | POA: Diagnosis not present

## 2021-11-05 DIAGNOSIS — I2510 Atherosclerotic heart disease of native coronary artery without angina pectoris: Secondary | ICD-10-CM | POA: Diagnosis not present

## 2021-11-05 DIAGNOSIS — H93293 Other abnormal auditory perceptions, bilateral: Secondary | ICD-10-CM | POA: Diagnosis not present

## 2021-11-05 DIAGNOSIS — I5032 Chronic diastolic (congestive) heart failure: Secondary | ICD-10-CM | POA: Diagnosis not present

## 2021-11-05 DIAGNOSIS — H6121 Impacted cerumen, right ear: Secondary | ICD-10-CM | POA: Diagnosis not present

## 2021-11-05 DIAGNOSIS — E7800 Pure hypercholesterolemia, unspecified: Secondary | ICD-10-CM | POA: Diagnosis not present

## 2021-11-05 DIAGNOSIS — I10 Essential (primary) hypertension: Secondary | ICD-10-CM | POA: Diagnosis not present

## 2021-11-18 ENCOUNTER — Ambulatory Visit: Payer: Medicare Other | Admitting: Podiatry

## 2021-11-18 DIAGNOSIS — M79674 Pain in right toe(s): Secondary | ICD-10-CM

## 2021-11-18 DIAGNOSIS — M79675 Pain in left toe(s): Secondary | ICD-10-CM | POA: Diagnosis not present

## 2021-11-18 DIAGNOSIS — B351 Tinea unguium: Secondary | ICD-10-CM | POA: Diagnosis not present

## 2021-11-18 DIAGNOSIS — L600 Ingrowing nail: Secondary | ICD-10-CM | POA: Diagnosis not present

## 2021-11-24 ENCOUNTER — Encounter: Payer: Self-pay | Admitting: Podiatry

## 2021-11-24 NOTE — Progress Notes (Signed)
Subjective:  Patient ID: [name], female    DOB: 1945-09-25,  MRN: [id number]  [name] presents to clinic today for painful thick toenails that are difficult to trim. Pain interferes with ambulation. Aggravating factors include wearing enclosed shoe gear. Pain is relieved with periodic professional debridement.  She states her left great toe feels sore on outside border. Denies any redness, drainage or swelling.  PCP is Rankins, Rebecca Salinas, MD , and last visit was  October 29, 2021  Allergies  Allergen Reactions   Azithromycin Swelling    Other reaction(s): swelling   Other Itching, Swelling, Other (See Comments) and Cough    Horse products Other reaction(s): reaction Other reaction(s): Unknown Other reaction(s): rash   Penicillins Swelling    Has patient had a PCN reaction causing immediate rash, facial/tongue/throat swelling, SOB or lightheadedness with hypotension: Yes Has patient had a PCN reaction causing severe rash involving mucus membranes or skin necrosis: No Has patient had a PCN reaction that required hospitalization: No Has patient had a PCN reaction occurring within the last 10 years: No If all of the above answers are "NO", then may proceed with Cephalosporin use.  Other reaction(s): rash Other reaction(s): rash   Tetanus Toxoids Swelling    Other reaction(s): sickness   Gadolinium Derivatives Hives and Itching    Pt stated that her left arm was itching and I noticed that she had two hives on her chest. No difficulty breathing, sneezing. One '25mg'$  of benadryl was ordered by Dr. Jimmye Norman. Pt remained at facility and was monitored before going home. -ldrake   Morphine And Related Nausea And Vomiting   Cefazolin     Other reaction(s): rash Other reaction(s): rash   Horse-Derived Products     Other reaction(s): reaction   Latex    Penicillin G     Other reaction(s): Unknown   Amoxicillin-Pot Clavulanate Rash    Has patient had a PCN reaction causing  immediate rash, facial/tongue/throat swelling, SOB or lightheadedness with hypotension: Yes Has patient had a PCN reaction causing severe rash involving mucus membranes or skin necrosis: Yes Has patient had a PCN reaction that required hospitalization:No Has patient had a PCN reaction occurring within the last 10 years: No If all of the above answers are "NO", then may proceed with Cephalosporin use.  Other reaction(s): rash, swelling    Review of Systems: Negative except as noted in the HPI.  Objective: General: Patient is a pleasant 76 y.o. Caucasian female in NAD. AAO x 3.   Neurovascular Examination: CFT immediate b/l LE. Palpable DP/PT pulses b/l LE. Digital hair sparse b/l. Skin temperature gradient WNL b/l. No pain with calf compression b/l. No edema noted b/l. No cyanosis or clubbing noted b/l LE.  Protective sensation intact 5/5 intact bilaterally with 10g monofilament b/l. Vibratory sensation intact b/l.  Dermatological:  Pedal integument with normal turgor, texture and tone b/l LE. No open wounds b/l. No interdigital macerations b/l. Toenails 1-5 b/l elongated, thickened, discolored with subungual debris. +Tenderness with dorsal palpation of nailplates.   Incurvated nailplate left great toe lateral border(s) with tenderness to palpation. No erythema, no edema, no drainage noted. No hyperkeratotic or porokeratotic lesions present.  Musculoskeletal:  Normal muscle strength 5/5 to all lower extremity muscle groups bilaterally. No pain, crepitus or joint limitation noted with ROM b/l LE. No gross bony pedal deformities b/l. Patient ambulates independently without assistive aids.  Assessment/Plan: 1. Pain due to onychomycosis of toenails of both feet   2. Ingrown  toenail without infection     -Consent given for treatment as described below: -Examined patient. -Mycotic toenails 1-5 bilaterally were debrided in length and girth with sterile nail nippers and dremel without  incident. -Discussed chronicity of ingrown toenail(s) of left great toe. Recommended patient consider having matrixectomy performed to alleviate chronic ingrown toenail(s). Discussed in-office procedure and post-procedure instructions. Patient states they will think about it. -Offending nail border debrided and curretaged left great toe utilizing sterile nail nipper and currette. Border(s) cleansed with alcohol and TAO applied. Patient/POA/Caregiver/Facility instructed to apply Neosporin Cream  to left great toe once daily for 7 days. Call office if there are any concerns. -Patient/POA to call should there be question/concern in the interim.   Return in about 3 months (around 02/18/2022).  Marzetta Board, DPM

## 2021-12-05 DIAGNOSIS — H903 Sensorineural hearing loss, bilateral: Secondary | ICD-10-CM | POA: Diagnosis not present

## 2021-12-18 DIAGNOSIS — E785 Hyperlipidemia, unspecified: Secondary | ICD-10-CM | POA: Diagnosis not present

## 2021-12-18 DIAGNOSIS — I129 Hypertensive chronic kidney disease with stage 1 through stage 4 chronic kidney disease, or unspecified chronic kidney disease: Secondary | ICD-10-CM | POA: Diagnosis not present

## 2021-12-18 DIAGNOSIS — N1832 Chronic kidney disease, stage 3b: Secondary | ICD-10-CM | POA: Diagnosis not present

## 2021-12-18 DIAGNOSIS — I5032 Chronic diastolic (congestive) heart failure: Secondary | ICD-10-CM | POA: Diagnosis not present

## 2021-12-29 ENCOUNTER — Other Ambulatory Visit: Payer: Self-pay | Admitting: Interventional Cardiology

## 2022-01-07 ENCOUNTER — Ambulatory Visit: Payer: Medicare Other | Admitting: Podiatry

## 2022-01-21 ENCOUNTER — Ambulatory Visit: Payer: Medicare Other | Admitting: Podiatry

## 2022-01-21 ENCOUNTER — Encounter: Payer: Self-pay | Admitting: Podiatry

## 2022-01-21 DIAGNOSIS — L600 Ingrowing nail: Secondary | ICD-10-CM | POA: Diagnosis not present

## 2022-01-21 DIAGNOSIS — B351 Tinea unguium: Secondary | ICD-10-CM

## 2022-01-21 DIAGNOSIS — M79675 Pain in left toe(s): Secondary | ICD-10-CM

## 2022-01-21 DIAGNOSIS — M79674 Pain in right toe(s): Secondary | ICD-10-CM | POA: Diagnosis not present

## 2022-01-21 NOTE — Progress Notes (Signed)
Subjective:  Patient ID: [name], female    DOB: [birth year],  MRN: [id number]  [name] presents to clinic today for:  Chief Complaint  Patient presents with   Nail Problem    Routine foot care PCP-[place]([facility]) PCP Vst-07/2021   Today, patient is requesting Rx for prednisone. She is seen by Orthopedics for OA of knees and feet. She has received injections in both knees.   PCP is Lurline Del, DO.  Allergies  Allergen Reactions   Azithromycin Swelling    Other reaction(s): swelling   Other Itching, Swelling, Other (See Comments) and Cough    Horse products Other reaction(s): reaction Other reaction(s): Unknown Other reaction(s): rash   Penicillins Swelling    Has patient had a PCN reaction causing immediate rash, facial/tongue/throat swelling, SOB or lightheadedness with hypotension: Yes Has patient had a PCN reaction causing severe rash involving mucus membranes or skin necrosis: No Has patient had a PCN reaction that required hospitalization: No Has patient had a PCN reaction occurring within the last 10 years: No If all of the above answers are "NO", then may proceed with Cephalosporin use.  Other reaction(s): rash Other reaction(s): rash   Tetanus Toxoids Swelling    Other reaction(s): sickness   Gadolinium Derivatives Hives and Itching    Pt stated that her left arm was itching and I noticed that she had two hives on her chest. No difficulty breathing, sneezing. One '25mg'$  of benadryl was ordered by Dr. Jimmye Norman. Pt remained at facility and was monitored before going home. -ldrake   Morphine And Related Nausea And Vomiting   Cefazolin     Other reaction(s): rash Other reaction(s): rash   Horse-Derived Products     Other reaction(s): reaction   Latex    Penicillin G     Other reaction(s): Unknown   Amoxicillin-Pot Clavulanate Rash    Has patient had a PCN reaction causing immediate rash, facial/tongue/throat swelling, SOB or  lightheadedness with hypotension: Yes Has patient had a PCN reaction causing severe rash involving mucus membranes or skin necrosis: Yes Has patient had a PCN reaction that required hospitalization:No Has patient had a PCN reaction occurring within the last 10 years: No If all of the above answers are "NO", then may proceed with Cephalosporin use.  Other reaction(s): rash, swelling    Review of Systems: Negative except as noted in the HPI.  Objective: No changes noted in today's physical examination.  [name] is a pleasant 76 y.o. female in NAD. AAO x 3.  Neurovascular Examination: CFT immediate b/l LE. Palpable DP/PT pulses b/l LE. Digital hair sparse b/l. Skin temperature gradient WNL b/l. No pain with calf compression b/l. No edema noted b/l. No cyanosis or clubbing noted b/l LE.  Protective sensation intact 5/5 intact bilaterally with 10g monofilament b/l. Vibratory sensation intact b/l.  Dermatological:  Pedal integument with normal turgor, texture and tone b/l LE. No open wounds b/l. No interdigital macerations b/l. Toenails 1-5 b/l elongated, thickened, discolored with subungual debris. +Tenderness with dorsal palpation of nailplates.   Incurvated nailplate left great toe both border(s) with tenderness to palpation. No erythema, no edema, no drainage noted.   No hyperkeratotic or porokeratotic lesions present.  Musculoskeletal:  Normal muscle strength 5/5 to all lower extremity muscle groups bilaterally. No pain, crepitus or joint limitation noted with ROM b/l LE. No gross bony pedal deformities b/l. Patient ambulates independently without assistive aids.  Assessment/Plan: 1. Pain due to onychomycosis of toenails of both  feet     No orders of the defined types were placed in this encounter.   -Consent given for treatment as described below: -Examined patient. -Due to continuity of care, advised patient to see Orthopedics for oral steroid request. She related  understanding. -Mycotic toenails 2-5 bilaterally and right great toe were debrided in length and girth with sterile nail nippers and dremel without iatrogenic bleeding. -Offending nail border debrided and curretaged left great toe utilizing sterile nail nipper and currette. Border(s) cleansed with alcohol and TAO applied. Patient/POA/Caregiver/Facility instructed to apply triple antibiotic ointment  to left great toe once daily for 7 days. Call office if there are any concerns. -Patient/POA to call should there be question/concern in the interim.   Return in about 3 months (around 04/23/2022).  Marzetta Board, DPM

## 2022-02-17 ENCOUNTER — Telehealth: Payer: Self-pay | Admitting: Interventional Cardiology

## 2022-02-17 NOTE — Telephone Encounter (Signed)
I spoke with patient.  She has enough Eliquis to last until her appointment on 12/12. She will then be out and is asking if samples can be provided at that time through the end of the year.  Reports she did not previously qualify for assistance program. Does not want to switch to coumadin.  I told patient I would check on this request and call her back.

## 2022-02-17 NOTE — Telephone Encounter (Signed)
Patient calling the office for samples of medication:   1.  What medication and dosage are you requesting samples for? ELIQUIS 5 MG TABS tablet  2.  Are you currently out of this medication? No   

## 2022-02-17 NOTE — Telephone Encounter (Signed)
Sample request for Eliquis received. Indication: Afib  Last office visit: 10/21/21 Ali Lowe)  Scr: 1.24 (12/18/21 via [place])  Age: 76 Weight: 83.7kg  Per office visit on 10/21/21 with Dr Ali Lowe:  "She has enough Eliquis to get her to her next appointment in December.  This should be addressed at that point in time.  She is not completely against starting Coumadin but due to her finances and due to the fact Eliquis has been working well she would like to try to remain on Eliquis "  Pt has scheduled appt with cardiologist on 03/25/22.   Eliquis '5mg'$  twice daily is appropriate dose.

## 2022-02-19 DIAGNOSIS — M25562 Pain in left knee: Secondary | ICD-10-CM | POA: Diagnosis not present

## 2022-02-19 DIAGNOSIS — Z23 Encounter for immunization: Secondary | ICD-10-CM | POA: Diagnosis not present

## 2022-02-19 DIAGNOSIS — I10 Essential (primary) hypertension: Secondary | ICD-10-CM | POA: Diagnosis not present

## 2022-02-19 DIAGNOSIS — I5032 Chronic diastolic (congestive) heart failure: Secondary | ICD-10-CM | POA: Diagnosis not present

## 2022-02-19 DIAGNOSIS — I2510 Atherosclerotic heart disease of native coronary artery without angina pectoris: Secondary | ICD-10-CM | POA: Diagnosis not present

## 2022-02-19 DIAGNOSIS — M25561 Pain in right knee: Secondary | ICD-10-CM | POA: Diagnosis not present

## 2022-02-19 DIAGNOSIS — N183 Chronic kidney disease, stage 3 unspecified: Secondary | ICD-10-CM | POA: Diagnosis not present

## 2022-02-19 DIAGNOSIS — E559 Vitamin D deficiency, unspecified: Secondary | ICD-10-CM | POA: Diagnosis not present

## 2022-02-19 DIAGNOSIS — E7800 Pure hypercholesterolemia, unspecified: Secondary | ICD-10-CM | POA: Diagnosis not present

## 2022-02-19 DIAGNOSIS — Z0000 Encounter for general adult medical examination without abnormal findings: Secondary | ICD-10-CM | POA: Diagnosis not present

## 2022-02-19 DIAGNOSIS — Z7901 Long term (current) use of anticoagulants: Secondary | ICD-10-CM | POA: Diagnosis not present

## 2022-02-20 DIAGNOSIS — Z1231 Encounter for screening mammogram for malignant neoplasm of breast: Secondary | ICD-10-CM | POA: Diagnosis not present

## 2022-02-21 NOTE — Telephone Encounter (Signed)
Called and spoke to patient's husband (DPR on file). Samples left up front for patient to pick up.

## 2022-03-19 DIAGNOSIS — R1310 Dysphagia, unspecified: Secondary | ICD-10-CM | POA: Diagnosis not present

## 2022-03-19 DIAGNOSIS — K219 Gastro-esophageal reflux disease without esophagitis: Secondary | ICD-10-CM | POA: Diagnosis not present

## 2022-03-19 DIAGNOSIS — Z86010 Personal history of colonic polyps: Secondary | ICD-10-CM | POA: Diagnosis not present

## 2022-03-20 ENCOUNTER — Telehealth: Payer: Self-pay

## 2022-03-20 NOTE — Telephone Encounter (Signed)
..     Pre-operative Risk Assessment    Patient Name: [name]  DOB: [birth year] MRN: [id number]      Request for Surgical Clearance    Procedure:   colonoscopy  Date of Surgery:  Clearance 03/31/22                                 Surgeon:  dr Watt Climes Surgeon's Group or Practice Name:  [facility] gastro[facility]ology Phone number:  [phone] Fax number:  [phone]   Type of Clearance Requested:   - Medical  - Pharmacy:  Hold Aspirin and Apixaban (Eliquis)     Type of Anesthesia:   propofol   Additional requests/questions:    Gwenlyn Found   03/20/2022, 2:15 PM

## 2022-03-20 NOTE — Telephone Encounter (Signed)
Patient with diagnosis of afib on Eliquis for anticoagulation.    Procedure: colonoscopy Date of procedure: 03/31/22  CHA2DS2-VASc Score = 6  This indicates a 9.7% annual risk of stroke. The patient's score is based upon: CHF History: 1 HTN History: 1 Diabetes History: 0 Stroke History: 0 Vascular Disease History: 1 Age Score: 2 Gender Score: 1   CrCl 51m/min using adjusted body weight  Per office protocol, patient can hold Eliquis for 1-2 days prior to procedure.    **This guidance is not considered finalized until pre-operative APP has relayed final recommendations.**

## 2022-03-20 NOTE — Telephone Encounter (Signed)
   Name: [name]  DOB: 1945/07/15  MRN: [id number]  Primary Cardiologist: Larae Grooms, MD  Chart reviewed as part of pre-operative protocol coverage. The patient has an upcoming visit scheduled with Dr. Irish Lack on 03/25/2022 at which time clearance can be addressed in case there are any issues that would impact surgical recommendations.  Colonoscopy is not scheduled until 03/31/2022 as below. I added preop FYI to appointment note so that provider is aware to address at time of outpatient visit.  Per office protocol the cardiology provider should forward their finalized clearance decision and recommendations regarding antiplatelet therapy to the requesting party below.    This message will also be routed to pharmacy pool for input on holding Eliquis as requested below so that this information is available to the clearing provider at time of patient's appointment.   I will route this message as FYI to requesting party and remove this message from the preop box as separate preop APP input not needed at this time.   Please call with any questions.  Lenna Sciara, NP  03/20/2022, 2:49 PM

## 2022-03-21 DIAGNOSIS — M19072 Primary osteoarthritis, left ankle and foot: Secondary | ICD-10-CM | POA: Diagnosis not present

## 2022-03-21 DIAGNOSIS — M19071 Primary osteoarthritis, right ankle and foot: Secondary | ICD-10-CM | POA: Diagnosis not present

## 2022-03-24 ENCOUNTER — Encounter: Payer: Self-pay | Admitting: Podiatry

## 2022-03-24 ENCOUNTER — Ambulatory Visit: Payer: Medicare Other | Admitting: Podiatry

## 2022-03-24 VITALS — BP 138/67

## 2022-03-24 DIAGNOSIS — M79674 Pain in right toe(s): Secondary | ICD-10-CM

## 2022-03-24 DIAGNOSIS — B351 Tinea unguium: Secondary | ICD-10-CM

## 2022-03-24 DIAGNOSIS — M79675 Pain in left toe(s): Secondary | ICD-10-CM | POA: Diagnosis not present

## 2022-03-24 NOTE — Progress Notes (Unsigned)
Cardiology Office Note   Date:  03/25/2022   ID:  Shakeisha, Horine [birth year], MRN [id number]  PCP:  Lurline Del, DO    No chief complaint on file.  CAD  Wt Readings from Last 3 Encounters:  03/25/22 192 lb 12.8 oz (87.5 kg)  10/21/21 184 lb 9.6 oz (83.7 kg)  04/11/21 202 lb (91.6 kg)       History of Present Illness: [name] is a 76 y.o. female   with CAD. She had PCI in 1999 and 2002.  She had a cath without obstructive disease in 2015.   She has had a chronic headache and is now seeing neuro.   She had acute diastolic heart failure after a trip to [place] and [place].  She returned in March 2020.  She was tested for COVID and this was negative.   She was diuresed 20 lbs.  She may have eaten more salt.  Troponin was negative.  BNP was elevated.    She was discharged in Lasix 20 mg daily, discharged from [facility] at the end of March 2020.    She used Biotin for hair loss.    In 08/2018, Some high BP readings, up to the 160s.  At last visit, plan was : Increase amlopdine to 10 mg daily.   A few weeks later: "She will move amlodipine and lisinopril to evening.  She will call in with readings.  If too low in AM, move amlodipine back to AM."     At 01/14/2019 visit, plan was :"Add spironolactone 25 mg daily.  Continue lasix daily.  BMet next week.   Recheck in a few weeks.  If spironolactone helps get BP to therapeutic range, would stop clonidine. Refill Lasix for daily use. "   Lost weight in 2021.    In 2022: "She has decreasing stamina.  Gets very fatigued at the end of the day.  Walking has decreased due to pain.  She does now have some shortness of breath that is worse than before. Had a recent episode of chest pressure requiring 2 SL NTG.  BP has been low at times.  Lowest 97/59. Did not feel well those days. "  Denies : Chest pain. Dizziness. Leg edema. Nitroglycerin use. Orthopnea. Palpitations. Paroxysmal nocturnal dyspnea. Shortness of breath.  Syncope.    Needs some esophageal stretching.  Has some pressure in her chest with lying down.  No exertional chest tightness.  Arthritis limits walking.    Past Medical History:  Diagnosis Date   Anxiety    Arthritis    Bone spur    Coronary artery disease    stent - 1999   Depression    Fibrocystic breast changes    GERD (gastroesophageal reflux disease)    Glucosuria    Hernia, inguinal, left 10/2002   Hypertension    Melanoma ([place]) 10/2008   right shoulder and arm   Menorrhagia    Myocardial infarction ([place]) 1999    Past Surgical History:  Procedure Laterality Date   ANGIOPLASTY  2000   APPENDECTOMY     bone chip removed from left foot      CHOLECYSTECTOMY     COLONOSCOPY WITH PROPOFOL N/A 03/03/2017   Procedure: COLONOSCOPY WITH PROPOFOL;  Surgeon: Juanita Craver, MD;  Location: [facility] ENDOSCOPY;  Service: Endoscopy;  Laterality: N/A;   CORONARY ANGIOPLASTY WITH STENT PLACEMENT  10/99   ESOPHAGOGASTRODUODENOSCOPY (EGD) WITH PROPOFOL N/A 03/03/2017   Procedure: ESOPHAGOGASTRODUODENOSCOPY (EGD) WITH PROPOFOL;  Surgeon: Juanita Craver, MD;  Location: [facility];  Service: Endoscopy;  Laterality: N/A;   HERNIA REPAIR     umbilical    HYSTEROSCOPY  2/98   D&C (polyps)   LEFT HEART CATHETERIZATION WITH CORONARY ANGIOGRAM N/A 01/25/2014   Procedure: LEFT HEART CATHETERIZATION WITH CORONARY ANGIOGRAM;  Surgeon: Jettie Booze, MD;  Location: [facility] CATH LAB;  Service: Cardiovascular;  Laterality: N/A;   NODE DISSECTION     neg   TOTAL ABDOMINAL HYSTERECTOMY     LSO     Failed TVH     Current Outpatient Medications  Medication Sig Dispense Refill   atorvastatin (LIPITOR) 20 MG tablet TAKE 1 TABLET BY MOUTH  DAILY AT 6 PM 90 tablet 3   Biotin 5 MG CAPS      ELIQUIS 5 MG TABS tablet TAKE 1 TABLET BY MOUTH TWICE  DAILY 200 tablet 2   estradiol (ESTRACE) 0.5 MG tablet      fluocinonide cream (LIDEX) 6.76 % Apply 1 application. topically 2 (two) times daily.     furosemide  (LASIX) 20 MG tablet Take 20 mg by mouth as needed.     hydrocortisone 2.5 % cream SMARTSIG:sparingly Topical Twice Daily     isosorbide mononitrate (IMDUR) 30 MG 24 hr tablet Take 1 tablet (30 mg total) by mouth daily. 90 tablet 3   labetalol (NORMODYNE) 200 MG tablet TAKE 1 TABLET BY MOUTH  TWICE DAILY 180 tablet 3   lisinopril (ZESTRIL) 20 MG tablet Take 1 tablet (20 mg total) by mouth daily. 90 tablet 3   nitroGLYCERIN (NITROSTAT) 0.4 MG SL tablet DISSOLVE 1 TABLET UNDER THE TONGUE EVERY 5 MINUTES AS  NEEDED FOR CHEST PAIN. MAX  OF 3 TABLETS IN 15 MINUTES. CALL 911 IF PAIN PERSISTS. 100 tablet 3   NON FORMULARY Pain cream for arthritis sent to [facility], faxed 11/01/2018 [id number]     Soft Lens Products (REWETTING DROPS) SOLN Place 1 drop 3 (three) times daily as needed into both eyes (for dry/irritated contact lenses).     spironolactone (ALDACTONE) 25 MG tablet TAKE 1 TABLET BY MOUTH DAILY 90 tablet 1   VITAMIN D PO Take 2,000 mg by mouth daily.     Vitamin D, Ergocalciferol, (DRISDOL) 1.25 MG (50000 UT) CAPS capsule TAKE 1 CAPSULE BY MOUTH WEEKLY, START VIT D 2,000 UNITS DAILY AFTER 60 DAYS     Aspirin 81 MG CAPS  (Patient not taking: Reported on 03/25/2022)     GAVILYTE-C 240 g solution Take by mouth. (Patient not taking: Reported on 03/25/2022)     GLUCOSAMINE-CHONDROITIN DS PO Take 2,000 mg by mouth daily at 12 noon. (Patient not taking: Reported on 03/25/2022)     mometasone (ELOCON) 0.1 % cream Apply 1 application. topically daily. (Patient not taking: Reported on 03/25/2022)     pantoprazole (PROTONIX) 40 MG tablet Take 40 mg by mouth daily. (Patient not taking: Reported on 03/25/2022)     predniSONE (STERAPRED UNI-PAK 21 TAB) 5 MG (21) TBPK tablet Take by mouth. (Patient not taking: Reported on 03/25/2022)     scopolamine (TRANSDERM-SCOP) 1 MG/3DAYS 1 patch every 3 (three) days. (Patient not taking: Reported on 03/25/2022)     No current facility-administered medications for  this visit.    Allergies:   Azithromycin, Other, Penicillins, Tetanus toxoids, Gadolinium derivatives, Morphine and related, Cefazolin, Horse-derived products, Latex, Penicillin g, and Amoxicillin-pot clavulanate    Social History:  The patient  reports that she has never smoked. She has never used  smokeless tobacco. She reports current alcohol use of about 1.0 standard drink of alcohol per week. She reports that she does not use drugs.   Family History:  The patient's family history includes Cancer in her father; Diabetes in her mother; Heart attack in her father; Heart disease in her father; Hypertension in her father and mother; Stroke in her mother.    ROS:  Please see the history of present illness.   Otherwise, review of systems are positive for knee pain.   All other systems are reviewed and negative.    PHYSICAL EXAM: VS:  BP 114/66   Pulse 61   Ht '5\' 2"'$  (1.575 m)   Wt 192 lb 12.8 oz (87.5 kg)   SpO2 95%   BMI 35.26 kg/m  , BMI Body mass index is 35.26 kg/m. GEN: Well nourished, well developed, in no acute distress HEENT: normal Neck: no JVD, carotid bruits, or masses Cardiac: RRR; no murmurs, rubs, or gallops,no edema  Respiratory:  clear to auscultation bilaterally, normal work of breathing GI: soft, nontender, nondistended, + BS MS: no deformity or atrophy Skin: warm and dry, no rash Neuro:  Strength and sensation are intact Psych: euthymic mood, full affect   EKG:   The ekg ordered today demonstrates normal ECG   Recent Labs: No results found for requested labs within last 365 days.   Lipid Panel No results found for: "CHOL", "TRIG", "HDL", "CHOLHDL", "VLDL", "LDLCALC", "LDLDIRECT"   Other studies Reviewed: Additional studies/ records that were reviewed today with results demonstrating: labs reviewed.   ASSESSMENT AND PLAN:  CAD: No angina.  Continue aggressive secondary prevention.  Increase activity to 30 minutes a day 5 days a week. Hypertension: The  current medical regimen is effective;  continue present plan and medications. Chronic diastolic heart failure: appears euvolemic.  Hyperlipidemia: LDL 67 in 11/23.  PAF: Eliquis for stroke prevention.  OK to hold for 2 days prior to upcoming colonoscopy on Monday.   PreDM: 6.0 in 2022.  Fasting glucose 92 in 2023.    Current medicines are reviewed at length with the patient today.  The patient concerns regarding her medicines were addressed.  The following changes have been made:  No change  Labs/ tests ordered today include:  No orders of the defined types were placed in this encounter.   Recommend 150 minutes/week of aerobic exercise Low fat, low carb, high fiber diet recommended  Disposition:   FU in 1 year   Signed, Larae Grooms, MD  03/25/2022 1:55 PM    [facility] [address], [place], [place]  [address] Phone: [phone]; Fax: [phone]

## 2022-03-24 NOTE — Progress Notes (Signed)
Subjective:  Patient ID: [name], female    DOB: [birth year],  MRN: [id number]  ADORIA KAWAMOTO presents to clinic today for painful thick toenails that are difficult to trim. Pain interferes with ambulation. Aggravating factors include wearing enclosed shoe gear. Pain is relieved with periodic professional debridement.  Chief Complaint  Patient presents with   Nail Problem    RFC PCP-Welborn, Ryan PCP VST- 1 month ago   New problem(s): None.   She and her husband will be traveling to their property in [place] for the winter in January and will return in April. They will then be going on a cruise with her sister and brother in law.  PCP is Lurline Del, DO.  Allergies  Allergen Reactions   Azithromycin Swelling    Other reaction(s): swelling   Other Itching, Swelling, Other (See Comments) and Cough    Horse products Other reaction(s): reaction Other reaction(s): Unknown Other reaction(s): rash   Penicillins Swelling    Has patient had a PCN reaction causing immediate rash, facial/tongue/throat swelling, SOB or lightheadedness with hypotension: Yes Has patient had a PCN reaction causing severe rash involving mucus membranes or skin necrosis: No Has patient had a PCN reaction that required hospitalization: No Has patient had a PCN reaction occurring within the last 10 years: No If all of the above answers are "NO", then may proceed with Cephalosporin use.  Other reaction(s): rash Other reaction(s): rash   Tetanus Toxoids Swelling    Other reaction(s): sickness   Gadolinium Derivatives Hives and Itching    Pt stated that her left arm was itching and I noticed that she had two hives on her chest. No difficulty breathing, sneezing. One '25mg'$  of benadryl was ordered by Dr. Jimmye Norman. Pt remained at facility and was monitored before going home. -ldrake   Morphine And Related Nausea And Vomiting   Cefazolin     Other reaction(s): rash Other reaction(s): rash   Horse-Derived  Products     Other reaction(s): reaction   Latex    Penicillin G     Other reaction(s): Unknown   Amoxicillin-Pot Clavulanate Rash    Has patient had a PCN reaction causing immediate rash, facial/tongue/throat swelling, SOB or lightheadedness with hypotension: Yes Has patient had a PCN reaction causing severe rash involving mucus membranes or skin necrosis: Yes Has patient had a PCN reaction that required hospitalization:No Has patient had a PCN reaction occurring within the last 10 years: No If all of the above answers are "NO", then may proceed with Cephalosporin use.  Other reaction(s): rash, swelling    Review of Systems: Negative except as noted in the HPI.  Objective: No changes noted in today's physical examination. Vitals:   03/24/22 1330  BP: 138/67   TEKLA MALACHOWSKI is a pleasant 76 y.o. female in NAD. AAO x 3.  Neurovascular Examination: CFT immediate b/l LE. Palpable DP/PT pulses b/l LE. Digital hair sparse b/l. Skin temperature gradient WNL b/l. No pain with calf compression b/l. No edema noted b/l. No cyanosis or clubbing noted b/l LE.  Protective sensation intact 5/5 intact bilaterally with 10g monofilament b/l. Vibratory sensation intact b/l.  Dermatological:  Pedal integument with normal turgor, texture and tone b/l LE. No open wounds b/l. No interdigital macerations b/l. Toenails 1-5 b/l elongated, thickened, discolored with subungual debris. +Tenderness with dorsal palpation of nailplates.   Incurvated nailplate left great toe both border(s) with tenderness to palpation. No erythema, no edema, no drainage noted.   No hyperkeratotic or  porokeratotic lesions present.  Musculoskeletal:  Normal muscle strength 5/5 to all lower extremity muscle groups bilaterally. No pain, crepitus or joint limitation noted with ROM b/l LE. No gross bony pedal deformities b/l. Patient ambulates independently without assistive aids.  Assessment/Plan: 1. Pain due to onychomycosis  of toenails of both feet     No orders of the defined types were placed in this encounter.  -Patient was evaluated and treated. All patient's and/or POA's questions/concerns answered on today's visit. -Patient to continue soft, supportive shoe gear daily. -Toenails 2-5 bilaterally and right great toe debrided in length and girth without iatrogenic bleeding with sterile nail nipper and dremel.  -No invasive procedure(s) performed. Offending nail border debrided and curretaged left great toe utilizing sterile nail nipper and currette. Border cleansed with alcohol and triple antibiotic ointment. No further treatment required by patient/caregiver. Call office if there are any concerns. -Patient/POA to call should there be question/concern in the interim.   Return in about 4 months (around 07/22/2022).  Marzetta Board, DPM

## 2022-03-25 ENCOUNTER — Encounter: Payer: Self-pay | Admitting: Interventional Cardiology

## 2022-03-25 ENCOUNTER — Ambulatory Visit: Payer: Medicare Other | Attending: Interventional Cardiology | Admitting: Interventional Cardiology

## 2022-03-25 VITALS — BP 114/66 | HR 61 | Ht 62.0 in | Wt 192.8 lb

## 2022-03-25 DIAGNOSIS — I5032 Chronic diastolic (congestive) heart failure: Secondary | ICD-10-CM | POA: Diagnosis not present

## 2022-03-25 DIAGNOSIS — I25118 Atherosclerotic heart disease of native coronary artery with other forms of angina pectoris: Secondary | ICD-10-CM | POA: Diagnosis not present

## 2022-03-25 DIAGNOSIS — E782 Mixed hyperlipidemia: Secondary | ICD-10-CM

## 2022-03-25 DIAGNOSIS — I700 Atherosclerosis of aorta: Secondary | ICD-10-CM

## 2022-03-25 DIAGNOSIS — I10 Essential (primary) hypertension: Secondary | ICD-10-CM | POA: Diagnosis not present

## 2022-03-25 NOTE — Patient Instructions (Signed)
Medication Instructions:  Your physician recommends that you continue on your current medications as directed. Please refer to the Current Medication list given to you today.  *If you need a refill on your cardiac medications before your next appointment, please call your pharmacy*   Lab Work: none If you have labs (blood work) drawn today and your tests are completely normal, you will receive your results only by: MyChart Message (if you have MyChart) OR A paper copy in the mail If you have any lab test that is abnormal or we need to change your treatment, we will call you to review the results.   Testing/Procedures: none   Follow-Up: At [facility], you and your health needs are our priority.  As part of our continuing mission to provide you with exceptional heart care, we have created designated Provider Care Teams.  These Care Teams include your primary Cardiologist (physician) and Advanced Practice Providers (APPs -  Physician Assistants and Nurse Practitioners) who all work together to provide you with the care you need, when you need it.  We recommend signing up for the patient portal called "MyChart".  Sign up information is provided on this After Visit Summary.  MyChart is used to connect with patients for Virtual Visits (Telemedicine).  Patients are able to view lab/test results, encounter notes, upcoming appointments, etc.  Non-urgent messages can be sent to your provider as well.   To learn more about what you can do with MyChart, go to [email or web address].    Your next appointment:   12 month(s)  The format for your next appointment:   In Person  Provider:   Jayadeep Varanasi, MD     Other Instructions    Important Information About Sugar       

## 2022-03-26 DIAGNOSIS — M19071 Primary osteoarthritis, right ankle and foot: Secondary | ICD-10-CM | POA: Diagnosis not present

## 2022-03-28 ENCOUNTER — Encounter: Payer: Self-pay | Admitting: Interventional Cardiology

## 2022-03-31 DIAGNOSIS — D123 Benign neoplasm of transverse colon: Secondary | ICD-10-CM | POA: Diagnosis not present

## 2022-03-31 DIAGNOSIS — D124 Benign neoplasm of descending colon: Secondary | ICD-10-CM | POA: Diagnosis not present

## 2022-03-31 DIAGNOSIS — K5730 Diverticulosis of large intestine without perforation or abscess without bleeding: Secondary | ICD-10-CM | POA: Diagnosis not present

## 2022-03-31 DIAGNOSIS — Z09 Encounter for follow-up examination after completed treatment for conditions other than malignant neoplasm: Secondary | ICD-10-CM | POA: Diagnosis not present

## 2022-03-31 DIAGNOSIS — K449 Diaphragmatic hernia without obstruction or gangrene: Secondary | ICD-10-CM | POA: Diagnosis not present

## 2022-03-31 DIAGNOSIS — D120 Benign neoplasm of cecum: Secondary | ICD-10-CM | POA: Diagnosis not present

## 2022-03-31 DIAGNOSIS — K222 Esophageal obstruction: Secondary | ICD-10-CM | POA: Diagnosis not present

## 2022-03-31 DIAGNOSIS — K635 Polyp of colon: Secondary | ICD-10-CM | POA: Diagnosis not present

## 2022-03-31 DIAGNOSIS — Z86010 Personal history of colonic polyps: Secondary | ICD-10-CM | POA: Diagnosis not present

## 2022-03-31 DIAGNOSIS — K2930 Chronic superficial gastritis without bleeding: Secondary | ICD-10-CM | POA: Diagnosis not present

## 2022-03-31 DIAGNOSIS — R1310 Dysphagia, unspecified: Secondary | ICD-10-CM | POA: Diagnosis not present

## 2022-03-31 DIAGNOSIS — D128 Benign neoplasm of rectum: Secondary | ICD-10-CM | POA: Diagnosis not present

## 2022-04-02 DIAGNOSIS — H524 Presbyopia: Secondary | ICD-10-CM | POA: Diagnosis not present

## 2022-04-04 DIAGNOSIS — J069 Acute upper respiratory infection, unspecified: Secondary | ICD-10-CM | POA: Diagnosis not present

## 2022-04-04 DIAGNOSIS — Z03818 Encounter for observation for suspected exposure to other biological agents ruled out: Secondary | ICD-10-CM | POA: Diagnosis not present

## 2022-04-09 DIAGNOSIS — D123 Benign neoplasm of transverse colon: Secondary | ICD-10-CM | POA: Diagnosis not present

## 2022-04-09 DIAGNOSIS — D120 Benign neoplasm of cecum: Secondary | ICD-10-CM | POA: Diagnosis not present

## 2022-04-09 DIAGNOSIS — D128 Benign neoplasm of rectum: Secondary | ICD-10-CM | POA: Diagnosis not present

## 2022-04-09 DIAGNOSIS — D124 Benign neoplasm of descending colon: Secondary | ICD-10-CM | POA: Diagnosis not present

## 2022-04-09 DIAGNOSIS — K635 Polyp of colon: Secondary | ICD-10-CM | POA: Diagnosis not present

## 2022-04-24 ENCOUNTER — Other Ambulatory Visit: Payer: Self-pay | Admitting: Interventional Cardiology

## 2022-04-25 ENCOUNTER — Other Ambulatory Visit: Payer: Self-pay

## 2022-04-25 DIAGNOSIS — I4891 Unspecified atrial fibrillation: Secondary | ICD-10-CM

## 2022-04-25 MED ORDER — ATORVASTATIN CALCIUM 20 MG PO TABS: ORAL_TABLET | ORAL | 3 refills | Status: DC

## 2022-04-25 MED ORDER — LISINOPRIL 20 MG PO TABS: 20.0000 mg | ORAL_TABLET | Freq: Every day | ORAL | 3 refills | Status: DC

## 2022-04-25 MED ORDER — ELIQUIS 5 MG PO TABS: 5.0000 mg | ORAL_TABLET | Freq: Two times a day (BID) | ORAL | 2 refills | Status: DC

## 2022-04-25 NOTE — Telephone Encounter (Signed)
Prescription refill request for Eliquis received. Indication:afib Last office visit:12/23 Scr:1.2 Age: 77 Weight:87.5  kg  Prescription refilled

## 2022-05-05 ENCOUNTER — Telehealth: Payer: Self-pay | Admitting: Interventional Cardiology

## 2022-05-05 NOTE — Telephone Encounter (Signed)
Pt c/o medication issue:    1. Name of Medication: l  2. How are you currently taking this medication (dosage and times per day)? As written  3. Are you having a reaction (difficulty breathing--STAT)? No   4. What is your medication issue? Please send in new prescription 3 refill, 90 day supply for  lisinopril (ZESTRIL) 10 MG tablet

## 2022-05-06 NOTE — Telephone Encounter (Signed)
Left message regarding refill request.

## 2022-06-03 ENCOUNTER — Other Ambulatory Visit: Payer: Self-pay

## 2022-06-03 DIAGNOSIS — I4891 Unspecified atrial fibrillation: Secondary | ICD-10-CM

## 2022-06-03 MED ORDER — ATORVASTATIN CALCIUM 20 MG PO TABS: ORAL_TABLET | ORAL | 3 refills | Status: DC

## 2022-06-03 MED ORDER — LABETALOL HCL 200 MG PO TABS: 200.0000 mg | ORAL_TABLET | Freq: Two times a day (BID) | ORAL | 3 refills | Status: DC

## 2022-06-03 MED ORDER — SPIRONOLACTONE 25 MG PO TABS: 25.0000 mg | ORAL_TABLET | Freq: Every day | ORAL | 3 refills | Status: DC

## 2022-06-03 MED ORDER — ISOSORBIDE MONONITRATE ER 30 MG PO TB24: 30.0000 mg | ORAL_TABLET | Freq: Every day | ORAL | 3 refills | Status: DC

## 2022-06-03 MED ORDER — LISINOPRIL 20 MG PO TABS: 20.0000 mg | ORAL_TABLET | Freq: Every day | ORAL | 3 refills | Status: DC

## 2022-06-03 MED ORDER — ELIQUIS 5 MG PO TABS: 5.0000 mg | ORAL_TABLET | Freq: Two times a day (BID) | ORAL | 1 refills | Status: DC

## 2022-06-03 NOTE — Telephone Encounter (Signed)
Pt last saw Dr Irish Lack 03/25/22, last labs 02/19/22 Creat 1.24 at [facility] per [facility]. Age 77, weight 87.5kg, based on specified criteria pt is on appropriate dosage of Eliquis 42m BID for afib.  Will refill rx.

## 2022-07-21 DIAGNOSIS — L821 Other seborrheic keratosis: Secondary | ICD-10-CM | POA: Diagnosis not present

## 2022-07-21 DIAGNOSIS — D225 Melanocytic nevi of trunk: Secondary | ICD-10-CM | POA: Diagnosis not present

## 2022-07-21 DIAGNOSIS — D2271 Melanocytic nevi of right lower limb, including hip: Secondary | ICD-10-CM | POA: Diagnosis not present

## 2022-07-21 DIAGNOSIS — D485 Neoplasm of uncertain behavior of skin: Secondary | ICD-10-CM | POA: Diagnosis not present

## 2022-07-21 DIAGNOSIS — D1801 Hemangioma of skin and subcutaneous tissue: Secondary | ICD-10-CM | POA: Diagnosis not present

## 2022-07-21 DIAGNOSIS — Z85820 Personal history of malignant melanoma of skin: Secondary | ICD-10-CM | POA: Diagnosis not present

## 2022-07-21 DIAGNOSIS — D224 Melanocytic nevi of scalp and neck: Secondary | ICD-10-CM | POA: Diagnosis not present

## 2022-07-21 DIAGNOSIS — L218 Other seborrheic dermatitis: Secondary | ICD-10-CM | POA: Diagnosis not present

## 2022-07-21 DIAGNOSIS — D2272 Melanocytic nevi of left lower limb, including hip: Secondary | ICD-10-CM | POA: Diagnosis not present

## 2022-07-21 DIAGNOSIS — D2262 Melanocytic nevi of left upper limb, including shoulder: Secondary | ICD-10-CM | POA: Diagnosis not present

## 2022-07-22 ENCOUNTER — Ambulatory Visit: Payer: Medicare HMO | Admitting: Podiatry

## 2022-07-22 VITALS — BP 109/60

## 2022-07-22 DIAGNOSIS — M79674 Pain in right toe(s): Secondary | ICD-10-CM | POA: Diagnosis not present

## 2022-07-22 DIAGNOSIS — B351 Tinea unguium: Secondary | ICD-10-CM

## 2022-07-22 DIAGNOSIS — M79675 Pain in left toe(s): Secondary | ICD-10-CM

## 2022-07-22 NOTE — Progress Notes (Signed)
Subjective:  Patient ID: Rebecca Pryolly F Bonnie, female    DOB: 1945-07-06,  MRN: [id number]  [name] presents to clinic today for painful thick toenails that are difficult to trim. Pain interferes with ambulation. Aggravating factors include wearing enclosed shoe gear. Pain is relieved with periodic professional debridement.  Chief Complaint  Patient presents with   Nail Problem    RFC PCP-Welborn PCP VST-02/2022   New problem(s): None.   PCP is Jackelyn PolingWelborn, Ryan, DO.  Allergies  Allergen Reactions   Azithromycin Swelling    Other reaction(s): swelling   Other Itching, Swelling, Other (See Comments) and Cough    Horse products Other reaction(s): reaction Other reaction(s): Unknown Other reaction(s): rash   Penicillins Swelling    Has patient had a PCN reaction causing immediate rash, facial/tongue/throat swelling, SOB or lightheadedness with hypotension: Yes Has patient had a PCN reaction causing severe rash involving mucus membranes or skin necrosis: No Has patient had a PCN reaction that required hospitalization: No Has patient had a PCN reaction occurring within the last 10 years: No If all of the above answers are "NO", then may proceed with Cephalosporin use.  Other reaction(s): rash Other reaction(s): rash   Tetanus Toxoids Swelling    Other reaction(s): sickness   Gadolinium Derivatives Hives and Itching    Pt stated that her left arm was itching and I noticed that she had two hives on her chest. No difficulty breathing, sneezing. One 25mg  of benadryl was ordered by Dr. Mayford KnifeWilliams. Pt remained at facility and was monitored before going home. -ldrake   Morphine And Related Nausea And Vomiting   Cefazolin     Other reaction(s): rash Other reaction(s): rash   Horse-Derived Products     Other reaction(s): reaction   Latex    Penicillin G     Other reaction(s): Unknown   Amoxicillin-Pot Clavulanate Rash    Has patient had a PCN reaction causing immediate rash,  facial/tongue/throat swelling, SOB or lightheadedness with hypotension: Yes Has patient had a PCN reaction causing severe rash involving mucus membranes or skin necrosis: Yes Has patient had a PCN reaction that required hospitalization:No Has patient had a PCN reaction occurring within the last 10 years: No If all of the above answers are "NO", then may proceed with Cephalosporin use.  Other reaction(s): rash, swelling    Review of Systems: Negative except as noted in the HPI.  Objective:  Vitals:   07/22/22 1340  BP: 109/60   Rebecca Pryolly F Lippard is a pleasant 77 y.o. female WD, WN in NAD. AAO x 3.  Neurovascular Examination: CFT immediate b/l LE. Palpable DP/PT pulses b/l LE. Digital hair sparse b/l. Skin temperature gradient WNL b/l. No pain with calf compression b/l. No edema noted b/l. No cyanosis or clubbing noted b/l LE.  Protective sensation intact 5/5 intact bilaterally with 10g monofilament b/l. Vibratory sensation intact b/l.  Dermatological:  Pedal integument with normal turgor, texture and tone b/l LE. No open wounds b/l. No interdigital macerations b/l. Toenails 1-5 b/l elongated, thickened, discolored with subungual debris. +Tenderness with dorsal palpation of nailplates.   Incurvated nailplate left great toe both border(s) with tenderness to palpation. No erythema, no edema, no drainage noted.   No hyperkeratotic or porokeratotic lesions present.  Musculoskeletal:  Normal muscle strength 5/5 to all lower extremity muscle groups bilaterally. No pain, crepitus or joint limitation noted with ROM b/l LE. No gross bony pedal deformities b/l. Patient ambulates independently without assistive aids.  Assessment/Plan: 1. Pain due to  onychomycosis of toenails of both feet   -Patient was evaluated and treated. All patient's and/or POA's questions/concerns answered on today's visit. -Patient to continue soft, supportive shoe gear daily. -Toenails 1-5 bilaterally were debrided in  length and girth with sterile nail nippers and dremel. Pinpoint bleeding of medial border of left great toe addressed with Lumicain Hemostatic Solution, cleansed with alcohol. triple antibiotic ointment applied. Patient to apply Neosporin once daily for one week. -Patient/POA to call should there be question/concern in the interim.   Return in about 3 months (around 10/21/2022).  Freddie BreechJennifer L Taimi Towe, DPM

## 2022-07-26 ENCOUNTER — Encounter: Payer: Self-pay | Admitting: Podiatry

## 2022-08-20 DIAGNOSIS — E785 Hyperlipidemia, unspecified: Secondary | ICD-10-CM | POA: Diagnosis not present

## 2022-08-20 DIAGNOSIS — N1832 Chronic kidney disease, stage 3b: Secondary | ICD-10-CM | POA: Diagnosis not present

## 2022-08-20 DIAGNOSIS — I5032 Chronic diastolic (congestive) heart failure: Secondary | ICD-10-CM | POA: Diagnosis not present

## 2022-08-20 DIAGNOSIS — M1990 Unspecified osteoarthritis, unspecified site: Secondary | ICD-10-CM | POA: Diagnosis not present

## 2022-08-20 DIAGNOSIS — I129 Hypertensive chronic kidney disease with stage 1 through stage 4 chronic kidney disease, or unspecified chronic kidney disease: Secondary | ICD-10-CM | POA: Diagnosis not present

## 2022-08-21 LAB — LAB REPORT - SCANNED: EGFR: 56

## 2022-08-29 DIAGNOSIS — D6869 Other thrombophilia: Secondary | ICD-10-CM | POA: Diagnosis not present

## 2022-08-29 DIAGNOSIS — J069 Acute upper respiratory infection, unspecified: Secondary | ICD-10-CM | POA: Diagnosis not present

## 2022-08-29 DIAGNOSIS — I10 Essential (primary) hypertension: Secondary | ICD-10-CM | POA: Diagnosis not present

## 2022-08-29 DIAGNOSIS — E559 Vitamin D deficiency, unspecified: Secondary | ICD-10-CM | POA: Diagnosis not present

## 2022-08-29 DIAGNOSIS — N183 Chronic kidney disease, stage 3 unspecified: Secondary | ICD-10-CM | POA: Diagnosis not present

## 2022-08-29 DIAGNOSIS — D649 Anemia, unspecified: Secondary | ICD-10-CM | POA: Diagnosis not present

## 2022-08-29 DIAGNOSIS — I480 Paroxysmal atrial fibrillation: Secondary | ICD-10-CM | POA: Diagnosis not present

## 2022-08-29 DIAGNOSIS — E261 Secondary hyperaldosteronism: Secondary | ICD-10-CM | POA: Diagnosis not present

## 2022-08-29 DIAGNOSIS — I5032 Chronic diastolic (congestive) heart failure: Secondary | ICD-10-CM | POA: Diagnosis not present

## 2022-08-29 DIAGNOSIS — I700 Atherosclerosis of aorta: Secondary | ICD-10-CM | POA: Diagnosis not present

## 2022-08-29 DIAGNOSIS — I25118 Atherosclerotic heart disease of native coronary artery with other forms of angina pectoris: Secondary | ICD-10-CM | POA: Diagnosis not present

## 2022-09-23 ENCOUNTER — Encounter: Payer: Self-pay | Admitting: Podiatry

## 2022-09-23 ENCOUNTER — Ambulatory Visit: Payer: Medicare HMO | Admitting: Podiatry

## 2022-09-23 VITALS — BP 112/60 | HR 68

## 2022-09-23 DIAGNOSIS — M79675 Pain in left toe(s): Secondary | ICD-10-CM | POA: Diagnosis not present

## 2022-09-23 DIAGNOSIS — B351 Tinea unguium: Secondary | ICD-10-CM

## 2022-09-23 DIAGNOSIS — M79674 Pain in right toe(s): Secondary | ICD-10-CM | POA: Diagnosis not present

## 2022-09-23 NOTE — Progress Notes (Signed)
Subjective:  Patient ID: [name], female    DOB: [birth year],  MRN: [id number]  IVANA YUSUPOV presents to clinic today for thick, elongated toenails both feet which are tender when wearing enclosed shoe gear. Chief Complaint  Patient presents with   Routine foot care    New problem(s): None.   PCP is Jackelyn Poling, DO.  Allergies  Allergen Reactions   Azithromycin Swelling    Other reaction(s): swelling   Other Itching, Swelling, Other (See Comments) and Cough    Horse products Other reaction(s): reaction Other reaction(s): Unknown Other reaction(s): rash   Penicillins Swelling    Has patient had a PCN reaction causing immediate rash, facial/tongue/throat swelling, SOB or lightheadedness with hypotension: Yes Has patient had a PCN reaction causing severe rash involving mucus membranes or skin necrosis: No Has patient had a PCN reaction that required hospitalization: No Has patient had a PCN reaction occurring within the last 10 years: No If all of the above answers are "NO", then may proceed with Cephalosporin use.  Other reaction(s): rash Other reaction(s): rash   Tetanus Toxoids Swelling    Other reaction(s): sickness   Gadolinium Derivatives Hives and Itching    Pt stated that her left arm was itching and I noticed that she had two hives on her chest. No difficulty breathing, sneezing. One 25mg  of benadryl was ordered by Dr. Mayford Knife. Pt remained at facility and was monitored before going home. -ldrake   Morphine And Codeine Nausea And Vomiting   Cefazolin     Other reaction(s): rash Other reaction(s): rash   Horse-Derived Products     Other reaction(s): reaction   Latex    Penicillin G     Other reaction(s): Unknown   Amoxicillin-Pot Clavulanate Rash    Has patient had a PCN reaction causing immediate rash, facial/tongue/throat swelling, SOB or lightheadedness with hypotension: Yes Has patient had a PCN reaction causing severe rash involving mucus membranes  or skin necrosis: Yes Has patient had a PCN reaction that required hospitalization:No Has patient had a PCN reaction occurring within the last 10 years: No If all of the above answers are "NO", then may proceed with Cephalosporin use.  Other reaction(s): rash, swelling    Review of Systems: Negative except as noted in the HPI. Objective:   Constitutional RENELDA FOK is a pleasant 77 y.o. female, WD, WN in NAD. AAO x 3.   Vascular Vascular Examination: Capillary refill time immediate b/l. Vascular status intact b/l with palpable pedal pulses. Pedal hair decreased b/l. No edema. No pain with calf compression b/l. Skin temperature gradient WNL b/l. No cyanosis or clubbing b/l.   Neurological Examination: Sensation grossly intact b/l with 10 gram monofilament. Vibratory sensation intact b/l.   Dermatological Examination: Pedal skin with normal turgor, texture and tone b/l.  No open wounds. No interdigital macerations.   Toenails 1-5 b/l thick, discolored, elongated with subungual debris and pain on dorsal palpation.   No hyperkeratotic nor porokeratotic lesions present on today's visit.  Musculoskeletal Examination: Muscle strength 5/5 to all lower extremity muscle groups bilaterally. No pain, crepitus or joint limitation noted with ROM bilateral LE. No gross bony deformities bilaterally. Patient ambulates independent of any assistive aids.  Radiographs: None   Assessment:   1. Pain due to onychomycosis of toenails of both feet    Plan:  Patient was evaluated and treated and all questions answered. Consent given for treatment as described below: -Continue supportive shoe gear daily. -Toenails were debrided in length  and girth 1-5 left foot and 1-5 right foot with sterile nail nippers and dremel without iatrogenic bleeding.  -Patient/POA to call should there be question/concern in the interim.  Return in about 9 weeks (around 11/25/2022).  Freddie Breech, DPM

## 2022-11-25 ENCOUNTER — Ambulatory Visit: Payer: Medicare HMO | Admitting: Podiatry

## 2022-12-09 ENCOUNTER — Telehealth: Payer: Self-pay | Admitting: Interventional Cardiology

## 2022-12-09 NOTE — Telephone Encounter (Signed)
Pt stated that she is in the donut hole and that she did not want to change medication. Please address

## 2022-12-09 NOTE — Telephone Encounter (Signed)
Called pt to inform her per Megan, RPH-CPP, that pt either can pt the Eliquis copay with the donut hole pricing or can apply for patient assistance. Pt stated that she would have to pay for the medication, because she does not qualified for pt assistance. I advised pt the if she has any other problems, questions or concerns, to give our office a call back. Pt verbalized understanding.

## 2022-12-09 NOTE — Telephone Encounter (Signed)
She will either need to pay the Eliquis copay at the pharmacy with donut hole pricing, or can apply for patient assistance but requirements for Medicare patients are noted below. If she applies for assistance, can give her 1-2 weeks of samples to hold her over while application is pending.   Application link: [email or web address]

## 2022-12-09 NOTE — Telephone Encounter (Signed)
Dosing is correct. Need to see why pt is requesting samples. If cost is an issue, cannot provide samples long term.  Anticoag options include:  Eliquis: Can apply for patient assistance through [employer]. The website for the application is http://[email or web address]-mendoza.org/. The website lets them know if they meet the income requirement. Note to Medicare Patients: In addition to the application, they need to submit documentation showing they have spent 3% of their annual household income on out-of-pocket prescription expenses for them and/or other members of their household. Their pharmacy can provide this report.  Xarelto: Similar to Eliquis, but it is taken just once a day. The drug company offers a program called Xarelto with Me Coverage Gap Support. It runs from April 1-December 31. Patients in the coverage gap can get Xarelto for $89 (for 30 days) or $250 (for 90 days). They can call to enroll at: 888-XARELTO (580)567-6588)  Dabigatran: Generic of Pradaxa. GoodRx coupon bypasses insurance and brings cost down to ~$70/month if insurance doesn't cover it at a better rate.  Warfarin: Generic and cheap alternative, however it requires frequent in office lab monitoring. It's less effective than the above options and has more food and drug interactions. There may be a copay associated with lab monitoring appts.

## 2022-12-09 NOTE — Telephone Encounter (Signed)
Patient calling the office for samples of medication:   1.  What medication and dosage are you requesting samples for?   ELIQUIS 5 MG TABS tablet    2.  Are you currently out of this medication? No - Running low

## 2023-01-27 ENCOUNTER — Ambulatory Visit: Payer: Medicare HMO | Admitting: Podiatry

## 2023-01-27 ENCOUNTER — Encounter: Payer: Self-pay | Admitting: Podiatry

## 2023-01-27 DIAGNOSIS — Z7901 Long term (current) use of anticoagulants: Secondary | ICD-10-CM | POA: Diagnosis not present

## 2023-01-27 DIAGNOSIS — M79674 Pain in right toe(s): Secondary | ICD-10-CM

## 2023-01-27 DIAGNOSIS — M79675 Pain in left toe(s): Secondary | ICD-10-CM

## 2023-01-27 DIAGNOSIS — B351 Tinea unguium: Secondary | ICD-10-CM | POA: Diagnosis not present

## 2023-01-27 NOTE — Progress Notes (Signed)
Subjective:  Patient ID: [name], female    DOB: 10/18/1945,  MRN: [id number]  [name] presents to clinic today for thick, elongated toenails both feet which are tender when wearing enclosed shoe gear. She is on Eliquis. Chief Complaint  Patient presents with   RFC    RFC BLOOD THINNER   New problem(s): None.   PCP is Jackelyn Poling, DO.  Allergies  Allergen Reactions   Azithromycin Swelling    Other reaction(s): swelling   Other Itching, Swelling, Other (See Comments) and Cough    Horse products Other reaction(s): reaction Other reaction(s): Unknown Other reaction(s): rash   Penicillins Swelling    Has patient had a PCN reaction causing immediate rash, facial/tongue/throat swelling, SOB or lightheadedness with hypotension: Yes Has patient had a PCN reaction causing severe rash involving mucus membranes or skin necrosis: No Has patient had a PCN reaction that required hospitalization: No Has patient had a PCN reaction occurring within the last 10 years: No If all of the above answers are "NO", then may proceed with Cephalosporin use.  Other reaction(s): rash Other reaction(s): rash   Tetanus Toxoids Swelling    Other reaction(s): sickness   Gadolinium Derivatives Hives and Itching    Pt stated that her left arm was itching and I noticed that she had two hives on her chest. No difficulty breathing, sneezing. One 25mg  of benadryl was ordered by Dr. Mayford Knife. Pt remained at facility and was monitored before going home. -ldrake   Morphine And Codeine Nausea And Vomiting   Cefazolin     Other reaction(s): rash Other reaction(s): rash   Horse-Derived Products     Other reaction(s): reaction   Latex    Penicillin G     Other reaction(s): Unknown   Amoxicillin-Pot Clavulanate Rash    Has patient had a PCN reaction causing immediate rash, facial/tongue/throat swelling, SOB or lightheadedness with hypotension: Yes Has patient had a PCN reaction causing severe  rash involving mucus membranes or skin necrosis: Yes Has patient had a PCN reaction that required hospitalization:No Has patient had a PCN reaction occurring within the last 10 years: No If all of the above answers are "NO", then may proceed with Cephalosporin use.  Other reaction(s): rash, swelling    Review of Systems: Negative except as noted in the HPI. Objective:   Constitutional [name] is a pleasant 77 y.o. female, WD, WN in NAD. AAO x 3.   Vascular Vascular Examination: Capillary refill time immediate b/l. Vascular status intact b/l with palpable pedal pulses. Pedal hair decreased b/l. No edema. No pain with calf compression b/l. Skin temperature gradient WNL b/l. No cyanosis or clubbing b/l.   Neurological Examination: Sensation grossly intact b/l with 10 gram monofilament. Vibratory sensation intact b/l.   Dermatological Examination: Pedal skin with normal turgor, texture and tone b/l.  No open wounds. No interdigital macerations.   Toenails 1-5 b/l thick, discolored, elongated with subungual debris and pain on dorsal palpation.   No hyperkeratotic nor porokeratotic lesions present on today's visit.  Musculoskeletal Examination: Muscle strength 5/5 to all lower extremity muscle groups bilaterally. No pain, crepitus or joint limitation noted with ROM bilateral LE. No gross bony deformities bilaterally. Patient ambulates independent of any assistive aids.  Radiographs: None   Assessment:   1. Pain due to onychomycosis of toenails of both feet    Plan:  Patient was evaluated and treated and all questions answered. Consent given for treatment as described below: -Continue supportive shoe  gear daily. -Toenails were debrided in length and girth 1-5 left foot and 1-5 right foot with sterile nail nippers and dremel without iatrogenic bleeding.  -Patient/POA to call should there be question/concern in the interim.  Return in about 3 months (around 04/29/2023) for  Routine Foot Care.  Barbaraann Share, DPM

## 2023-02-24 ENCOUNTER — Ambulatory Visit: Payer: Medicare Other | Admitting: Internal Medicine

## 2023-02-25 DIAGNOSIS — I10 Essential (primary) hypertension: Secondary | ICD-10-CM | POA: Diagnosis not present

## 2023-02-25 DIAGNOSIS — N183 Chronic kidney disease, stage 3 unspecified: Secondary | ICD-10-CM | POA: Diagnosis not present

## 2023-02-25 DIAGNOSIS — E7800 Pure hypercholesterolemia, unspecified: Secondary | ICD-10-CM | POA: Diagnosis not present

## 2023-02-25 DIAGNOSIS — D649 Anemia, unspecified: Secondary | ICD-10-CM | POA: Diagnosis not present

## 2023-02-25 DIAGNOSIS — E559 Vitamin D deficiency, unspecified: Secondary | ICD-10-CM | POA: Diagnosis not present

## 2023-02-26 LAB — LAB REPORT - SCANNED: EGFR: 44

## 2023-03-02 DIAGNOSIS — R69 Illness, unspecified: Secondary | ICD-10-CM | POA: Diagnosis not present

## 2023-03-21 NOTE — Progress Notes (Unsigned)
Cardiology Office Note   Date:  03/21/2023   ID:  Soraya, Santellano February 12, 1946, MRN [id number]  PCP:  Jackelyn Poling, DO    No chief complaint on file.  CAD  Wt Readings from Last 3 Encounters:  03/25/22 192 lb 12.8 oz (87.5 kg)  10/21/21 184 lb 9.6 oz (83.7 kg)  04/11/21 202 lb (91.6 kg)       History of Present Illness: DANESSA BECKNELL is a 77 y.o. female   with CAD. She had PCI in 1999 and 2002.  She had a cath without obstructive disease in 2015.   She has had a chronic headache and is now seeing neuro.   She had acute diastolic heart failure after a trip to [place] and [place].  She returned in March 2020.  She was tested for COVID and this was negative.   She was diuresed 20 lbs.  She may have eaten more salt.  Troponin was negative.  BNP was elevated.    She was discharged in Lasix 20 mg daily, discharged from [facility] at the end of March 2020.    She used Biotin for hair loss.    In 08/2018, Some high BP readings, up to the 160s.  At last visit, plan was : Increase amlopdine to 10 mg daily.   A few weeks later: "She will move amlodipine and lisinopril to evening.  She will call in with readings.  If too low in AM, move amlodipine back to AM."     At 01/14/2019 visit, plan was :"Add spironolactone 25 mg daily.  Continue lasix daily.  BMet next week.   Recheck in a few weeks.  If spironolactone helps get BP to therapeutic range, would stop clonidine. Refill Lasix for daily use. "   Lost weight in 2021.    In 2022: "She has decreasing stamina.  Gets very fatigued at the end of the day.  Walking has decreased due to pain.  She does now have some shortness of breath that is worse than before. Had a recent episode of chest pressure requiring 2 SL NTG.  BP has been low at times.  Lowest 97/59. Did not feel well those days. "  Denies : Chest pain. Dizziness. Leg edema. Nitroglycerin use. Orthopnea. Palpitations. Paroxysmal nocturnal dyspnea. Shortness of breath.  Syncope.    Needs some esophageal stretching.  Has some pressure in her chest with lying down.  No exertional chest tightness.  Arthritis limits walking.   The pt wa previously seen by Catalina Gravel  Couple episodes of heart flutterin in bed  Just a couple times   HOurs   No dizziness   No SOB  No CP      Pt has arthritis  Knees, feet, hips     Past Medical History:  Diagnosis Date   Anxiety    Arthritis    Bone spur    Coronary artery disease    stent - 1999   Depression    Fibrocystic breast changes    GERD (gastroesophageal reflux disease)    Glucosuria    Hernia, inguinal, left 10/2002   Hypertension    Melanoma (HCC) 10/2008   right shoulder and arm   Menorrhagia    Myocardial infarction (HCC) 1999    Past Surgical History:  Procedure Laterality Date   ANGIOPLASTY  2000   APPENDECTOMY     bone chip removed from left foot      CHOLECYSTECTOMY     COLONOSCOPY  WITH PROPOFOL N/A 03/03/2017   Procedure: COLONOSCOPY WITH PROPOFOL;  Surgeon: Charna Elizabeth, MD;  Location: [facility];  Service: Endoscopy;  Laterality: N/A;   CORONARY ANGIOPLASTY WITH STENT PLACEMENT  10/99   ESOPHAGOGASTRODUODENOSCOPY (EGD) WITH PROPOFOL N/A 03/03/2017   Procedure: ESOPHAGOGASTRODUODENOSCOPY (EGD) WITH PROPOFOL;  Surgeon: Charna Elizabeth, MD;  Location: [facility];  Service: Endoscopy;  Laterality: N/A;   HERNIA REPAIR     umbilical    HYSTEROSCOPY  2/98   D&C (polyps)   LEFT HEART CATHETERIZATION WITH CORONARY ANGIOGRAM N/A 01/25/2014   Procedure: LEFT HEART CATHETERIZATION WITH CORONARY ANGIOGRAM;  Surgeon: Corky Crafts, MD;  Location: [facility] CATH LAB;  Service: Cardiovascular;  Laterality: N/A;   NODE DISSECTION     neg   TOTAL ABDOMINAL HYSTERECTOMY     LSO     Failed TVH     Current Outpatient Medications  Medication Sig Dispense Refill   Aspirin 81 MG CAPS      atorvastatin (LIPITOR) 20 MG tablet TAKE 1 TABLET BY MOUTH  DAILY AT 6 PM 90 tablet 3   Biotin 5 MG CAPS       ELIQUIS 5 MG TABS tablet Take 1 tablet (5 mg total) by mouth 2 (two) times daily. 180 tablet 1   estradiol (ESTRACE) 0.5 MG tablet      fluocinonide cream (LIDEX) 0.05 % Apply 1 application. topically 2 (two) times daily.     furosemide (LASIX) 20 MG tablet Take 20 mg by mouth as needed.     GAVILYTE-C 240 g solution Take by mouth.     GLUCOSAMINE-CHONDROITIN DS PO Take 2,000 mg by mouth daily at 12 noon.     hydrocortisone 2.5 % cream SMARTSIG:sparingly Topical Twice Daily     isosorbide mononitrate (IMDUR) 30 MG 24 hr tablet Take 1 tablet (30 mg total) by mouth daily. 90 tablet 3   labetalol (NORMODYNE) 200 MG tablet Take 1 tablet (200 mg total) by mouth 2 (two) times daily. 180 tablet 3   lisinopril (ZESTRIL) 20 MG tablet Take 1 tablet (20 mg total) by mouth daily. 90 tablet 3   mometasone (ELOCON) 0.1 % cream Apply 1 application  topically daily.     nitroGLYCERIN (NITROSTAT) 0.4 MG SL tablet DISSOLVE 1 TABLET UNDER THE TONGUE EVERY 5 MINUTES AS  NEEDED FOR CHEST PAIN. MAX  OF 3 TABLETS IN 15 MINUTES. CALL 911 IF PAIN PERSISTS. 125 tablet 1   NON FORMULARY Pain cream for arthritis sent to [facility], faxed 11/01/2018 [facility]     pantoprazole (PROTONIX) 40 MG tablet Take 40 mg by mouth daily.     predniSONE (STERAPRED UNI-PAK 21 TAB) 5 MG (21) TBPK tablet Take by mouth.     scopolamine (TRANSDERM-SCOP) 1 MG/3DAYS 1 patch every 3 (three) days.     Soft Lens Products (REWETTING DROPS) SOLN Place 1 drop 3 (three) times daily as needed into both eyes (for dry/irritated contact lenses).     spironolactone (ALDACTONE) 25 MG tablet Take 1 tablet (25 mg total) by mouth daily. 90 tablet 3   VITAMIN D PO Take 2,000 mg by mouth daily.     Vitamin D, Ergocalciferol, (DRISDOL) 1.25 MG (50000 UT) CAPS capsule TAKE 1 CAPSULE BY MOUTH WEEKLY, START VIT D 2,000 UNITS DAILY AFTER 60 DAYS     No current facility-administered medications for this visit.    Allergies:   Azithromycin, Other, Penicillins,  Tetanus toxoids, Gadolinium derivatives, Morphine and codeine, Cefazolin, Horse-derived products, Latex, Penicillin g, and Amoxicillin-pot  clavulanate    Social History:  The patient  reports that she has never smoked. She has never used smokeless tobacco. She reports current alcohol use of about 1.0 standard drink of alcohol per week. She reports that she does not use drugs.   Family History:  The patient's family history includes Cancer in her father; Diabetes in her mother; Heart attack in her father; Heart disease in her father; Hypertension in her father and mother; Stroke in her mother.    ROS:  Please see the history of present illness.   Otherwise, review of systems are positive for knee pain.   All other systems are reviewed and negative.    PHYSICAL EXAM: VS:  There were no vitals taken for this visit. , BMI There is no height or weight on file to calculate BMI. GEN: Well nourished, well developed, in no acute distress HEENT: normal Neck: no JVD, carotid bruits, or masses Cardiac: RRR; no murmurs, rubs, or gallops,no edema  Respiratory:  clear to auscultation bilaterally, normal work of breathing GI: soft, nontender, nondistended, + BS MS: no deformity or atrophy Skin: warm and dry, no rash Neuro:  Strength and sensation are intact Psych: euthymic mood, full affect   EKG:   The ekg ordered today demonstrates normal ECG   Recent Labs: No results found for requested labs within last 365 days.   Lipid Panel No results found for: "CHOL", "TRIG", "HDL", "CHOLHDL", "VLDL", "LDLCALC", "LDLDIRECT"   Other studies Reviewed: Additional studies/ records that were reviewed today with results demonstrating: labs reviewed.   ASSESSMENT AND PLAN:  CAD: No angina.  Continue aggressive secondary prevention.  Increase activity to 30 minutes a day 5 days a week. Hypertension: The current medical regimen is effective;  continue present plan and medications. Chronic diastolic heart  failure: appears euvolemic.  Hyperlipidemia: LDL 67 in 11/23.  PAF: Eliquis for stroke prevention.  OK to hold for 2 days prior to upcoming colonoscopy on Monday.   PreDM: 6.0 in 2022.  Fasting glucose 92 in 2023.    Current medicines are reviewed at length with the patient today.  The patient concerns regarding her medicines were addressed.  The following changes have been made:  No change  Labs/ tests ordered today include:  No orders of the defined types were placed in this encounter.   Recommend 150 minutes/week of aerobic exercise Low fat, low carb, high fiber diet recommended  Disposition:   FU in 1 year   Signed, Dietrich Pates, MD  03/21/2023 11:03 PM    [facility] [address] [place], [place], [place]  [address] Phone: [phone]; Fax: [phone]

## 2023-03-24 ENCOUNTER — Other Ambulatory Visit: Payer: Self-pay

## 2023-03-24 ENCOUNTER — Ambulatory Visit: Payer: Medicare HMO | Attending: Internal Medicine | Admitting: Internal Medicine

## 2023-03-24 ENCOUNTER — Encounter: Payer: Self-pay | Admitting: Internal Medicine

## 2023-03-24 VITALS — BP 120/62 | HR 60 | Ht 62.0 in | Wt 188.0 lb

## 2023-03-24 DIAGNOSIS — I10 Essential (primary) hypertension: Secondary | ICD-10-CM

## 2023-03-24 DIAGNOSIS — I2510 Atherosclerotic heart disease of native coronary artery without angina pectoris: Secondary | ICD-10-CM | POA: Diagnosis not present

## 2023-03-24 DIAGNOSIS — Z09 Encounter for follow-up examination after completed treatment for conditions other than malignant neoplasm: Secondary | ICD-10-CM | POA: Diagnosis not present

## 2023-03-24 MED ORDER — APIXABAN 5 MG PO TABS: 5.0000 mg | ORAL_TABLET | Freq: Two times a day (BID) | ORAL | Status: DC

## 2023-03-24 NOTE — Patient Instructions (Signed)
Medication Instructions:   *If you need a refill on your cardiac medications before your next appointment, please call your pharmacy*   Lab Work:  If you have labs (blood work) drawn today and your tests are completely normal, you will receive your results only by: MyChart Message (if you have MyChart) OR A paper copy in the mail If you have any lab test that is abnormal or we need to change your treatment, we will call you to review the results.   Testing/Procedures:    Follow-Up: At [facility], you and your health needs are our priority.  As part of our continuing mission to provide you with exceptional heart care, we have created designated Provider Care Teams.  These Care Teams include your primary Cardiologist (physician) and Advanced Practice Providers (APPs -  Physician Assistants and Nurse Practitioners) who all work together to provide you with the care you need, when you need it.  We recommend signing up for the patient portal called "MyChart".  Sign up information is provided on this After Visit Summary.  MyChart is used to connect with patients for Virtual Visits (Telemedicine).  Patients are able to view lab/test results, encounter notes, upcoming appointments, etc.  Non-urgent messages can be sent to your provider as well.   To learn more about what you can do with MyChart, go to [email or web address].    Your next appointment:  Nurse visit in the next couple of weeks for BP and to bring her cuff.... at her convenience before she goes out of town

## 2023-03-31 ENCOUNTER — Encounter: Payer: Self-pay | Admitting: Podiatry

## 2023-03-31 ENCOUNTER — Ambulatory Visit: Payer: Medicare HMO | Admitting: Podiatry

## 2023-03-31 DIAGNOSIS — M79675 Pain in left toe(s): Secondary | ICD-10-CM

## 2023-03-31 DIAGNOSIS — M79674 Pain in right toe(s): Secondary | ICD-10-CM

## 2023-03-31 DIAGNOSIS — B351 Tinea unguium: Secondary | ICD-10-CM

## 2023-04-05 ENCOUNTER — Encounter: Payer: Self-pay | Admitting: Podiatry

## 2023-04-05 NOTE — Progress Notes (Signed)
Subjective:  Patient ID: [name], female    DOB: August 04, 1945,  MRN: [id number]  77 y.o. female presents to clinic with  painful thick toenails that are difficult to trim. Pain interferes with ambulation. Aggravating factors include wearing enclosed shoe gear. Pain is relieved with periodic professional debridement.  Chief Complaint  Patient presents with   Nail Problem    Patient states she last saw her pcp this month      New problem(s): None   PCP is Jackelyn Poling, DO.  Allergies  Allergen Reactions   Azithromycin Swelling    Other reaction(s): swelling   Other Itching, Swelling, Other (See Comments) and Cough    Horse products Other reaction(s): reaction Other reaction(s): Unknown Other reaction(s): rash   Penicillins Swelling    Has patient had a PCN reaction causing immediate rash, facial/tongue/throat swelling, SOB or lightheadedness with hypotension: Yes Has patient had a PCN reaction causing severe rash involving mucus membranes or skin necrosis: No Has patient had a PCN reaction that required hospitalization: No Has patient had a PCN reaction occurring within the last 10 years: No If all of the above answers are "NO", then may proceed with Cephalosporin use.  Other reaction(s): rash Other reaction(s): rash   Tetanus Toxoids Swelling    Other reaction(s): sickness   Gadolinium Derivatives Hives and Itching    Pt stated that her left arm was itching and I noticed that she had two hives on her chest. No difficulty breathing, sneezing. One 25mg  of benadryl was ordered by Dr. Mayford Knife. Pt remained at facility and was monitored before going home. -ldrake   Morphine And Codeine Nausea And Vomiting   Cefazolin     Other reaction(s): rash Other reaction(s): rash   Horse-Derived Products     Other reaction(s): reaction   Latex    Penicillin G     Other reaction(s): Unknown   Amoxicillin-Pot Clavulanate Rash    Has patient had a PCN reaction causing immediate  rash, facial/tongue/throat swelling, SOB or lightheadedness with hypotension: Yes Has patient had a PCN reaction causing severe rash involving mucus membranes or skin necrosis: Yes Has patient had a PCN reaction that required hospitalization:No Has patient had a PCN reaction occurring within the last 10 years: No If all of the above answers are "NO", then may proceed with Cephalosporin use.  Other reaction(s): rash, swelling    Review of Systems: Negative except as noted in the HPI.   Objective:  [name] is a pleasant 77 y.o. female WD, WN in NAD.Marland Kitchen AAO x 3.  Vascular Examination: Vascular status intact b/l with palpable pedal pulses. CFT immediate b/l. No edema. No pain with calf compression b/l. Skin temperature gradient WNL b/l. Pedal hair present.  Neurological Examination: Sensation grossly intact b/l with 10 gram monofilament. Vibratory sensation intact b/l.   Dermatological Examination: Pedal skin with normal turgor, texture and tone b/l. Toenails 1-5 b/l thick, discolored, elongated with subungual debris and pain on dorsal palpation. No hyperkeratotic lesions noted b/l.   Musculoskeletal Examination: Muscle strength 5/5 to b/l LE. No pain, crepitus or joint limitation noted with ROM bilateral LE. No gross bony deformities bilaterally.  Radiographs: None  Last A1c:       No data to display         Assessment:   1. Pain due to onychomycosis of toenails of both feet    Plan:  -Examined patient. -Mycotic toenails 1-5 bilaterally were debrided in length and girth with sterile nail nippers  and dremel without incident. -Patient/POA to call should there be question/concern in the interim.  Return in about 3 months (around 06/29/2023).  Freddie Breech, DPM      [place] LOCATION: [address], [place] [address]                   Office [phone]   [facility] LOCATION: [address] [place], [place] [address] Office [phone]

## 2023-04-06 ENCOUNTER — Other Ambulatory Visit: Payer: Self-pay

## 2023-04-06 MED ORDER — ATORVASTATIN CALCIUM 20 MG PO TABS: ORAL_TABLET | ORAL | 3 refills | Status: DC

## 2023-04-09 ENCOUNTER — Other Ambulatory Visit: Payer: Self-pay

## 2023-04-09 MED ORDER — ISOSORBIDE MONONITRATE ER 30 MG PO TB24: 30.0000 mg | ORAL_TABLET | Freq: Every day | ORAL | 3 refills | Status: DC

## 2023-04-09 MED ORDER — SPIRONOLACTONE 25 MG PO TABS: 25.0000 mg | ORAL_TABLET | Freq: Every day | ORAL | 3 refills | Status: DC

## 2023-04-13 ENCOUNTER — Ambulatory Visit: Payer: Medicare HMO | Attending: Cardiovascular Disease | Admitting: Cardiovascular Disease

## 2023-04-13 VITALS — BP 114/60 | HR 70 | Ht 62.0 in | Wt 188.0 lb

## 2023-04-13 DIAGNOSIS — I10 Essential (primary) hypertension: Secondary | ICD-10-CM | POA: Diagnosis not present

## 2023-04-13 NOTE — Progress Notes (Signed)
   Nurse Visit   Date of Encounter: 04/13/2023 ID: [name], DOB [birth year], MRN [id number]  PCP:  Jackelyn Poling, DO   [facility] Providers Cardiologist:  DR Dietrich Pates      Visit Details   VS:  BP 114/60 (Cuff Size: Normal)   Pulse 70   Ht 5\' 2"  (1.575 m)   Wt 188 lb (85.3 kg)   BMI 34.39 kg/m  , BMI Body mass index is 34.39 kg/m.  Wt Readings from Last 3 Encounters:  04/13/23 188 lb (85.3 kg)  03/24/23 188 lb (85.3 kg)  03/25/22 192 lb 12.8 oz (87.5 kg)     Reason for visit: B/P CHECK  Performed today: B/P WITH PT'S CUFF WAS 107/66 AND MANUAL CHECK WAS 114/60  Changes (medications, testing, etc.) : NO CHANGES Length of Visit: 5 minutes PT BROUGHT BP LOG  READINGS FROM  12/10-30/24 PT DID NOT CHECK EVERY DAY  140/77  69 161/86   67 147/80  65  136/77  75  154/84  70 138/84  71 125/81  75 122/74    67  PER PT ABOVE READING PT DOES NOT REMEMBER IF MEDS WERE TAKEN PRIOR TO B/P CHECKED WILL BE MINDFUL OF THIS WHEN CHECKS FUTURE READINGS    Medications Adjustments/Labs and Tests Ordered:  Jeanie Cooks, LPN  00/93/8182 2:24 PM

## 2023-05-28 ENCOUNTER — Other Ambulatory Visit: Payer: Self-pay | Admitting: Interventional Cardiology

## 2023-05-28 ENCOUNTER — Other Ambulatory Visit: Payer: Self-pay

## 2023-05-28 ENCOUNTER — Other Ambulatory Visit (HOSPITAL_COMMUNITY): Payer: Self-pay

## 2023-05-28 ENCOUNTER — Other Ambulatory Visit: Payer: Self-pay | Admitting: Internal Medicine

## 2023-05-28 DIAGNOSIS — I4891 Unspecified atrial fibrillation: Secondary | ICD-10-CM

## 2023-05-28 MED ORDER — ELIQUIS 5 MG PO TABS: 5.0000 mg | ORAL_TABLET | Freq: Two times a day (BID) | ORAL | 1 refills | Status: DC

## 2023-05-28 MED ORDER — LABETALOL HCL 200 MG PO TABS: 200.0000 mg | ORAL_TABLET | Freq: Two times a day (BID) | ORAL | 3 refills | Status: DC

## 2023-05-28 MED ORDER — LABETALOL HCL 200 MG PO TABS
200.0000 mg | ORAL_TABLET | Freq: Two times a day (BID) | ORAL | 3 refills | Status: DC
  Filled 2023-05-28: qty 180, 90d supply, fill #0

## 2023-05-28 NOTE — Telephone Encounter (Signed)
Pt last saw Dr Nelly Laurence 04/13/23, last labs 02/26/23 Creat 1.26, age 78, weight 85.3kg, based on specified criteria pt is on appropriate dosage of Eliquis 5mg  BID for afib.  Will refill rx.     Pt requested 90 day supply be sent to [facility] in [place], [place]. Will also refill mail order rx x 1.

## 2023-05-28 NOTE — Telephone Encounter (Signed)
Eliquis prescription has already been sent to pharmacy. See previous refill request in Epic.

## 2023-05-28 NOTE — Telephone Encounter (Signed)
*  STAT* If patient is at the pharmacy, call can be transferred to refill team.   1. Which medications need to be refilled? (please list name of each medication and dose if known)    apixaban (ELIQUIS) 5 MG TABS tablet    labetalol (NORMODYNE) 200 MG tablet    2. Which pharmacy/location (including street and city if local pharmacy) is medication to be sent to? [id number] - [place], [place] - [address] AT [facility] OF [address]   3. Do they need a 30 day or 90 day supply?  90   Patient is completely out of this medication and is currently out of town. Requesting PA for these medications as well.

## 2023-05-28 NOTE — Telephone Encounter (Signed)
Pt last saw Dr Nelly Laurence 04/13/23, last labs 02/26/23 Creat 1.26, age 78, weight 85.3kg, based on specified criteria pt is on appropriate dosage of Eliquis 5mg  BID for afib.  Will refill rx.

## 2023-05-29 ENCOUNTER — Other Ambulatory Visit: Payer: Self-pay

## 2023-05-29 DIAGNOSIS — I4891 Unspecified atrial fibrillation: Secondary | ICD-10-CM

## 2023-05-29 MED ORDER — ELIQUIS 5 MG PO TABS: 5.0000 mg | ORAL_TABLET | Freq: Two times a day (BID) | ORAL | 1 refills | Status: DC

## 2023-05-29 MED ORDER — LABETALOL HCL 200 MG PO TABS: 200.0000 mg | ORAL_TABLET | Freq: Two times a day (BID) | ORAL | 3 refills | Status: DC

## 2023-05-29 NOTE — Telephone Encounter (Signed)
Pt's medications were resent to pt's mail order pharmacy [facility]. Confirmation  received.

## 2023-06-25 ENCOUNTER — Other Ambulatory Visit: Payer: Self-pay

## 2023-06-25 MED ORDER — ATORVASTATIN CALCIUM 20 MG PO TABS: ORAL_TABLET | ORAL | 2 refills | Status: DC

## 2023-06-25 MED ORDER — SPIRONOLACTONE 25 MG PO TABS
25.0000 mg | ORAL_TABLET | Freq: Every day | ORAL | 2 refills | Status: DC
Start: 2023-06-25 — End: 2023-10-27

## 2023-06-25 MED ORDER — ISOSORBIDE MONONITRATE ER 30 MG PO TB24
30.0000 mg | ORAL_TABLET | Freq: Every day | ORAL | 2 refills | Status: DC
Start: 2023-06-25 — End: 2023-11-03

## 2023-07-21 ENCOUNTER — Ambulatory Visit: Payer: Medicare HMO | Admitting: Podiatry

## 2023-07-21 ENCOUNTER — Encounter: Payer: Self-pay | Admitting: Podiatry

## 2023-07-21 DIAGNOSIS — B351 Tinea unguium: Secondary | ICD-10-CM | POA: Diagnosis not present

## 2023-07-21 DIAGNOSIS — M79675 Pain in left toe(s): Secondary | ICD-10-CM

## 2023-07-21 DIAGNOSIS — M79674 Pain in right toe(s): Secondary | ICD-10-CM

## 2023-07-23 DIAGNOSIS — L821 Other seborrheic keratosis: Secondary | ICD-10-CM | POA: Diagnosis not present

## 2023-07-23 DIAGNOSIS — Z85820 Personal history of malignant melanoma of skin: Secondary | ICD-10-CM | POA: Diagnosis not present

## 2023-07-23 DIAGNOSIS — D1801 Hemangioma of skin and subcutaneous tissue: Secondary | ICD-10-CM | POA: Diagnosis not present

## 2023-07-23 DIAGNOSIS — D224 Melanocytic nevi of scalp and neck: Secondary | ICD-10-CM | POA: Diagnosis not present

## 2023-07-23 DIAGNOSIS — D225 Melanocytic nevi of trunk: Secondary | ICD-10-CM | POA: Diagnosis not present

## 2023-07-23 DIAGNOSIS — D1721 Benign lipomatous neoplasm of skin and subcutaneous tissue of right arm: Secondary | ICD-10-CM | POA: Diagnosis not present

## 2023-07-23 DIAGNOSIS — R208 Other disturbances of skin sensation: Secondary | ICD-10-CM | POA: Diagnosis not present

## 2023-07-26 NOTE — Progress Notes (Signed)
 Subjective:  Patient ID: [name], female    DOB: [birth year],  MRN: [id number]  [name] presents to clinic today for painful, elongated thickened toenails x 10 which are symptomatic when wearing enclosed shoe gear. This interferes with his/her daily activities.  Chief Complaint  Patient presents with   RFC    She is here for nail trim, PCP is Dr Donalynn Fry, and seen 2 times a year,    New problem(s): None.   PCP is Mordechai April, DO.  Allergies  Allergen Reactions   Azithromycin Swelling    Other reaction(s): swelling   Other Itching, Swelling, Other (See Comments) and Cough    Horse products Other reaction(s): reaction Other reaction(s): Unknown Other reaction(s): rash   Penicillins Swelling    Has patient had a PCN reaction causing immediate rash, facial/tongue/throat swelling, SOB or lightheadedness with hypotension: Yes Has patient had a PCN reaction causing severe rash involving mucus membranes or skin necrosis: No Has patient had a PCN reaction that required hospitalization: No Has patient had a PCN reaction occurring within the last 10 years: No If all of the above answers are "NO", then may proceed with Cephalosporin use.  Other reaction(s): rash Other reaction(s): rash   Tetanus Toxoids Swelling    Other reaction(s): sickness   Gadolinium Derivatives Hives and Itching    Pt stated that her left arm was itching and I noticed that she had two hives on her chest. No difficulty breathing, sneezing. One 25mg  of benadryl was ordered by Dr. Broadus Canes. Pt remained at facility and was monitored before going home. -ldrake   Morphine And Codeine Nausea And Vomiting   Cefazolin     Other reaction(s): rash Other reaction(s): rash   Horse-Derived Products     Other reaction(s): reaction   Latex    Penicillin G     Other reaction(s): Unknown   Amoxicillin-Pot Clavulanate Rash    Has patient had a PCN reaction causing immediate rash, facial/tongue/throat swelling,  SOB or lightheadedness with hypotension: Yes Has patient had a PCN reaction causing severe rash involving mucus membranes or skin necrosis: Yes Has patient had a PCN reaction that required hospitalization:No Has patient had a PCN reaction occurring within the last 10 years: No If all of the above answers are "NO", then may proceed with Cephalosporin use.  Other reaction(s): rash, swelling    Review of Systems: Negative except as noted in the HPI.  Objective: No changes noted in today's physical examination. There were no vitals filed for this visit. [name] is a pleasant 78 y.o. female WD, WN in NAD. AAO x 3.  Vascular Examination: Vascular status intact b/l with palpable pedal pulses. CFT immediate b/l. No edema. No pain with calf compression b/l. Skin temperature gradient WNL b/l. Pedal hair present.  Neurological Examination: Sensation grossly intact b/l with 10 gram monofilament. Vibratory sensation intact b/l.   Dermatological Examination: Pedal skin with normal turgor, texture and tone b/l. Toenails 1-5 b/l thick, discolored, elongated with subungual debris and pain on dorsal palpation. No hyperkeratotic lesions noted b/l.   Musculoskeletal Examination: Muscle strength 5/5 to b/l LE. No pain, crepitus or joint limitation noted with ROM bilateral LE. No gross bony deformities bilaterally.  Radiographs: None  Assessment/Plan: 1. Pain due to onychomycosis of toenails of both feet     Patient was evaluated and treated. All patient's and/or POA's questions/concerns addressed on today's visit. Toenails 1-5 debrided in length and girth without incident. Continue soft, supportive shoe gear  daily. Report any pedal injuries to medical professional. Call office if there are any questions/concerns. -Patient/POA to call should there be question/concern in the interim.   Return in about 9 weeks (around 09/22/2023).  [name], DPM      [place] LOCATION: [address], [place] [address]                   Office [phone]   [facility] LOCATION: [address] [place], [place] [address] Office [phone]

## 2023-07-29 ENCOUNTER — Ambulatory Visit: Payer: Medicare HMO | Admitting: Podiatry

## 2023-07-29 DIAGNOSIS — Z1231 Encounter for screening mammogram for malignant neoplasm of breast: Secondary | ICD-10-CM | POA: Diagnosis not present

## 2023-08-17 DIAGNOSIS — H2513 Age-related nuclear cataract, bilateral: Secondary | ICD-10-CM | POA: Diagnosis not present

## 2023-08-26 DIAGNOSIS — E785 Hyperlipidemia, unspecified: Secondary | ICD-10-CM | POA: Diagnosis not present

## 2023-08-26 DIAGNOSIS — I129 Hypertensive chronic kidney disease with stage 1 through stage 4 chronic kidney disease, or unspecified chronic kidney disease: Secondary | ICD-10-CM | POA: Diagnosis not present

## 2023-08-26 DIAGNOSIS — I5032 Chronic diastolic (congestive) heart failure: Secondary | ICD-10-CM | POA: Diagnosis not present

## 2023-08-26 DIAGNOSIS — M1990 Unspecified osteoarthritis, unspecified site: Secondary | ICD-10-CM | POA: Diagnosis not present

## 2023-08-26 DIAGNOSIS — N1832 Chronic kidney disease, stage 3b: Secondary | ICD-10-CM | POA: Diagnosis not present

## 2023-09-09 DIAGNOSIS — I5032 Chronic diastolic (congestive) heart failure: Secondary | ICD-10-CM | POA: Diagnosis not present

## 2023-09-09 DIAGNOSIS — I480 Paroxysmal atrial fibrillation: Secondary | ICD-10-CM | POA: Diagnosis not present

## 2023-09-09 DIAGNOSIS — R079 Chest pain, unspecified: Secondary | ICD-10-CM | POA: Diagnosis not present

## 2023-09-09 DIAGNOSIS — N1831 Chronic kidney disease, stage 3a: Secondary | ICD-10-CM | POA: Diagnosis not present

## 2023-09-09 DIAGNOSIS — I10 Essential (primary) hypertension: Secondary | ICD-10-CM | POA: Diagnosis not present

## 2023-09-09 DIAGNOSIS — I25118 Atherosclerotic heart disease of native coronary artery with other forms of angina pectoris: Secondary | ICD-10-CM | POA: Diagnosis not present

## 2023-09-09 DIAGNOSIS — H9201 Otalgia, right ear: Secondary | ICD-10-CM | POA: Diagnosis not present

## 2023-09-09 DIAGNOSIS — D649 Anemia, unspecified: Secondary | ICD-10-CM | POA: Diagnosis not present

## 2023-09-09 DIAGNOSIS — I700 Atherosclerosis of aorta: Secondary | ICD-10-CM | POA: Diagnosis not present

## 2023-09-15 DIAGNOSIS — M170 Bilateral primary osteoarthritis of knee: Secondary | ICD-10-CM | POA: Diagnosis not present

## 2023-09-30 ENCOUNTER — Ambulatory Visit: Admitting: Podiatry

## 2023-09-30 ENCOUNTER — Ambulatory Visit (INDEPENDENT_AMBULATORY_CARE_PROVIDER_SITE_OTHER): Payer: Medicare HMO | Admitting: Podiatry

## 2023-09-30 DIAGNOSIS — Z91198 Patient's noncompliance with other medical treatment and regimen for other reason: Secondary | ICD-10-CM

## 2023-09-30 DIAGNOSIS — M79674 Pain in right toe(s): Secondary | ICD-10-CM | POA: Diagnosis not present

## 2023-09-30 DIAGNOSIS — M79675 Pain in left toe(s): Secondary | ICD-10-CM

## 2023-09-30 DIAGNOSIS — B351 Tinea unguium: Secondary | ICD-10-CM | POA: Diagnosis not present

## 2023-09-30 NOTE — Progress Notes (Signed)
 1. Failure to attend appointment with reason given    Patient canceled and rescheduled appointment.

## 2023-09-30 NOTE — Progress Notes (Unsigned)
 Subjective:  Patient ID: [name], female    DOB: 1946/04/10,  MRN: [id number]  [name] presents to clinic today for painful mycotic toenails of both feet that are difficult to trim. Pain interferes with daily activities and wearing enclosed shoe gear comfortably.  Chief Complaint  Patient presents with   RFC    Rm15 not diabetic/dr Donalynn Fry Last visit April 2025   New problem(s): None.   PCP is Mordechai April, DO.  Allergies  Allergen Reactions   Azithromycin Swelling    Other reaction(s): swelling   Other Itching, Swelling, Other (See Comments) and Cough    Horse products Other reaction(s): reaction Other reaction(s): Unknown Other reaction(s): rash   Penicillins Swelling    Has patient had a PCN reaction causing immediate rash, facial/tongue/throat swelling, SOB or lightheadedness with hypotension: Yes Has patient had a PCN reaction causing severe rash involving mucus membranes or skin necrosis: No Has patient had a PCN reaction that required hospitalization: No Has patient had a PCN reaction occurring within the last 10 years: No If all of the above answers are NO, then may proceed with Cephalosporin use.  Other reaction(s): rash Other reaction(s): rash   Tetanus Toxoids Swelling    Other reaction(s): sickness   Gadolinium Derivatives Hives and Itching    Pt stated that her left arm was itching and I noticed that she had two hives on her chest. No difficulty breathing, sneezing. One 25mg  of benadryl was ordered by Dr. Broadus Canes. Pt remained at facility and was monitored before going home. -ldrake   Morphine And Codeine Nausea And Vomiting   Cefazolin     Other reaction(s): rash Other reaction(s): rash   Horse-Derived Products     Other reaction(s): reaction   Latex    Penicillin G     Other reaction(s): Unknown   Amoxicillin-Pot Clavulanate Rash    Has patient had a PCN reaction causing immediate rash, facial/tongue/throat swelling, SOB or  lightheadedness with hypotension: Yes Has patient had a PCN reaction causing severe rash involving mucus membranes or skin necrosis: Yes Has patient had a PCN reaction that required hospitalization:No Has patient had a PCN reaction occurring within the last 10 years: No If all of the above answers are NO, then may proceed with Cephalosporin use.  Other reaction(s): rash, swelling    Review of Systems: Negative except as noted in the HPI.  Objective: No changes noted in today's physical examination. There were no vitals filed for this visit. [name] is a pleasant 78 y.o. female in NAD. AAO x 3.  Vascular Examination: Capillary refill time immediate b/l. Palpable pedal pulses. Pedal hair present b/l. No pain with calf compression b/l. Skin temperature gradient WNL b/l. No cyanosis or clubbing b/l. No ischemia or gangrene noted b/l. {jgvascular:[id number]}  Neurological Examination: Sensation grossly intact b/l with 10 gram monofilament. Vibratory sensation intact b/l.   Dermatological Examination: Pedal skin with normal turgor, texture and tone b/l.  No open wounds. No interdigital macerations.   Toenails 1-5 b/l thick, discolored, elongated with subungual debris and pain on dorsal palpation.   No corns, calluses nor porokeratotic lesions noted.  Musculoskeletal Examination: {jgmsk:23600}  Radiographs: None  Assessment/Plan: 1. Pain due to onychomycosis of toenails of both feet     Consent given for treatment. Patient examined. All patient's and/or POA's questions/concerns addressed on today's visit.Toenails 1-5 debrided in length and girth without incident. Continue soft, supportive shoe gear daily. Report any pedal injuries to medical professional. Call office  if there are any questions/concerns. -Patient/POA to call should there be question/concern in the interim.   Return in about 9 weeks (around 12/02/2023).  [name], DPM      [place] LOCATION: [address] [place] [address]                   Office [phone]   [facility] LOCATION: [address] [place], [place] [address] Office [phone]

## 2023-10-01 DIAGNOSIS — J208 Acute bronchitis due to other specified organisms: Secondary | ICD-10-CM | POA: Diagnosis not present

## 2023-10-01 DIAGNOSIS — M159 Polyosteoarthritis, unspecified: Secondary | ICD-10-CM | POA: Diagnosis not present

## 2023-10-06 ENCOUNTER — Encounter: Payer: Self-pay | Admitting: Podiatry

## 2023-10-09 ENCOUNTER — Emergency Department (HOSPITAL_COMMUNITY)

## 2023-10-09 ENCOUNTER — Encounter (HOSPITAL_COMMUNITY): Payer: Self-pay

## 2023-10-09 ENCOUNTER — Emergency Department (HOSPITAL_COMMUNITY)
Admission: EM | Admit: 2023-10-09 | Discharge: 2023-10-09 | Disposition: A | Discharge status: Home / Self Care | Attending: Student | Admitting: Student

## 2023-10-09 ENCOUNTER — Other Ambulatory Visit: Payer: Self-pay

## 2023-10-09 DIAGNOSIS — K5730 Diverticulosis of large intestine without perforation or abscess without bleeding: Secondary | ICD-10-CM | POA: Diagnosis not present

## 2023-10-09 DIAGNOSIS — R1032 Left lower quadrant pain: Secondary | ICD-10-CM | POA: Diagnosis not present

## 2023-10-09 DIAGNOSIS — Z91040 Latex allergy status: Secondary | ICD-10-CM | POA: Insufficient documentation

## 2023-10-09 DIAGNOSIS — I130 Hypertensive heart and chronic kidney disease with heart failure and stage 1 through stage 4 chronic kidney disease, or unspecified chronic kidney disease: Secondary | ICD-10-CM | POA: Diagnosis not present

## 2023-10-09 DIAGNOSIS — I5032 Chronic diastolic (congestive) heart failure: Secondary | ICD-10-CM | POA: Insufficient documentation

## 2023-10-09 DIAGNOSIS — N183 Chronic kidney disease, stage 3 unspecified: Secondary | ICD-10-CM | POA: Diagnosis not present

## 2023-10-09 DIAGNOSIS — R112 Nausea with vomiting, unspecified: Secondary | ICD-10-CM | POA: Insufficient documentation

## 2023-10-09 DIAGNOSIS — Z79899 Other long term (current) drug therapy: Secondary | ICD-10-CM | POA: Diagnosis not present

## 2023-10-09 DIAGNOSIS — I2510 Atherosclerotic heart disease of native coronary artery without angina pectoris: Secondary | ICD-10-CM | POA: Diagnosis not present

## 2023-10-09 DIAGNOSIS — E860 Dehydration: Secondary | ICD-10-CM | POA: Diagnosis not present

## 2023-10-09 DIAGNOSIS — D72829 Elevated white blood cell count, unspecified: Secondary | ICD-10-CM | POA: Insufficient documentation

## 2023-10-09 DIAGNOSIS — Z7982 Long term (current) use of aspirin: Secondary | ICD-10-CM | POA: Diagnosis not present

## 2023-10-09 LAB — CBC WITH DIFFERENTIAL/PLATELET
Abs Immature Granulocytes: 0.19 10*3/uL — ABNORMAL HIGH (ref 0.00–0.07)
Basophils Absolute: 0.1 10*3/uL (ref 0.0–0.1)
Basophils Relative: 0 %
Eosinophils Absolute: 0 10*3/uL (ref 0.0–0.5)
Eosinophils Relative: 0 %
HCT: 47.8 % — ABNORMAL HIGH (ref 36.0–46.0)
Hemoglobin: 15.7 g/dL — ABNORMAL HIGH (ref 12.0–15.0)
Immature Granulocytes: 1 %
Lymphocytes Relative: 12 %
Lymphs Abs: 1.8 10*3/uL (ref 0.7–4.0)
MCH: 31.6 pg (ref 26.0–34.0)
MCHC: 32.8 g/dL (ref 30.0–36.0)
MCV: 96.2 fL (ref 80.0–100.0)
Monocytes Absolute: 0.9 10*3/uL (ref 0.1–1.0)
Monocytes Relative: 6 %
Neutro Abs: 11.5 10*3/uL — ABNORMAL HIGH (ref 1.7–7.7)
Neutrophils Relative %: 81 %
Platelets: 283 10*3/uL (ref 150–400)
RBC: 4.97 MIL/uL (ref 3.87–5.11)
RDW: 12.3 % (ref 11.5–15.5)
WBC: 14.3 10*3/uL — ABNORMAL HIGH (ref 4.0–10.5)
nRBC: 0 % (ref 0.0–0.2)

## 2023-10-09 LAB — COMPREHENSIVE METABOLIC PANEL WITH GFR
ALT: 31 U/L (ref 0–44)
AST: 20 U/L (ref 15–41)
Albumin: 3.7 g/dL (ref 3.5–5.0)
Alkaline Phosphatase: 56 U/L (ref 38–126)
Anion gap: 17 — ABNORMAL HIGH (ref 5–15)
BUN: 38 mg/dL — ABNORMAL HIGH (ref 8–23)
CO2: 15 mmol/L — ABNORMAL LOW (ref 22–32)
Calcium: 9.8 mg/dL (ref 8.9–10.3)
Chloride: 105 mmol/L (ref 98–111)
Creatinine, Ser: 1.61 mg/dL — ABNORMAL HIGH (ref 0.44–1.00)
GFR, Estimated: 33 mL/min — ABNORMAL LOW (ref >60)
Glucose, Bld: 155 mg/dL — ABNORMAL HIGH (ref 70–99)
Potassium: 4.1 mmol/L (ref 3.5–5.1)
Sodium: 137 mmol/L (ref 135–145)
Total Bilirubin: 1.6 mg/dL — ABNORMAL HIGH (ref 0.0–1.2)
Total Protein: 6.3 g/dL — ABNORMAL LOW (ref 6.5–8.1)

## 2023-10-09 LAB — LIPASE, BLOOD: Lipase: 37 U/L (ref 11–51)

## 2023-10-09 MED ORDER — ONDANSETRON 4 MG PO TBDP
4.0000 mg | ORAL_TABLET | Freq: Three times a day (TID) | ORAL | 0 refills | Status: DC | PRN
Start: 2023-10-09 — End: 2023-11-16

## 2023-10-09 MED ORDER — ONDANSETRON HCL 4 MG/2ML IJ SOLN
4.0000 mg | Freq: Once | INTRAMUSCULAR | Status: AC
  Administered 2023-10-09: 4 mg via INTRAVENOUS
  Filled 2023-10-09: qty 2

## 2023-10-09 MED ORDER — IOHEXOL 350 MG/ML SOLN
75.0000 mL | Freq: Once | INTRAVENOUS | Status: AC | PRN
  Administered 2023-10-09: 75 mL via INTRAVENOUS

## 2023-10-09 MED ORDER — DICYCLOMINE HCL 20 MG PO TABS: 20.0000 mg | ORAL_TABLET | Freq: Two times a day (BID) | ORAL | 0 refills | Status: DC

## 2023-10-09 MED ORDER — DICYCLOMINE HCL 10 MG/ML IM SOLN: 20.0000 mg | Freq: Once | INTRAMUSCULAR | Status: DC

## 2023-10-09 MED ORDER — ONDANSETRON HCL 4 MG/2ML IJ SOLN: 4.0000 mg | Freq: Once | INTRAMUSCULAR | Status: DC

## 2023-10-09 MED ORDER — LACTATED RINGERS IV BOLUS
1000.0000 mL | Freq: Once | INTRAVENOUS | Status: AC
  Administered 2023-10-09: 1000 mL via INTRAVENOUS

## 2023-10-09 NOTE — ED Provider Triage Note (Signed)
 Emergency Medicine Provider Triage Evaluation Note  [name] , a 78 y.o. female  was evaluated in triage.  Pt complains of dysphagia. Report difficulty swallowing either liquid or solid x 2 days.  Hx of esophageal stricture requiring dilation in the past.  Also report having LLQ pain for the same duration.  Felt similar to prior diverticulitis.  Was seen at PCP and sent here for further evaluation.  Pt feel dehydrated  Review of Systems  Positive: As above Negative: As above  Physical Exam  BP 122/77 (BP Location: Right Arm)   Pulse (!) 58   Temp 99 F (37.2 C)   Resp 18   SpO2 99%  Gen:   Awake, no distress   Resp:  Normal effort  MSK:   Moves extremities without difficulty  Other:    Medical Decision Making  Medically screening exam initiated at 12:45 PM.  Appropriate orders placed.  [name] was informed that the remainder of the evaluation will be completed by another provider, this initial triage assessment does not replace that evaluation, and the importance of remaining in the ED until their evaluation is complete.     Nivia Colon, PA-C 10/09/23 1246

## 2023-10-09 NOTE — ED Triage Notes (Signed)
 Patient BIB husband from UC for IV fluids. Patient unable to keep food or fluids down x 3 days. Hx of esophageal stricture, has not been stretched in 3 years, usually done every 2 yrs. Patient also having diarrhea, hx of diverticulitis. A&Ox4, 6/10 abd pain

## 2023-10-10 NOTE — ED Provider Notes (Signed)
 [facility] Provider Note  CSN: [id number] Arrival date & time: 10/09/23 1210  Chief Complaint(s) Emesis and Nausea  HPI [name] is a 78 y.o. female with PMH CAD status post MI, GERD, HTN, diverticulitis, anxiety who presents emerged part for evaluation of abdominal pain nausea vomiting.  States that over the last 3 days she has been unable to tolerate p.o. and has had persistent vomiting.  She states that she feels that her esophageal stricture has returned because she is persistently vomiting but states that her sensation of lower globus started after initially vomiting.  Endorses left lower quadrant abdominal pain but denies chest pain, shortness of breath, headache, fever or other systemic symptoms.   Past Medical History Past Medical History:  Diagnosis Date   Anxiety    Arthritis    Bone spur    Coronary artery disease    stent - 1999   Depression    Fibrocystic breast changes    GERD (gastroesophageal reflux disease)    Glucosuria    Hernia, inguinal, left 10/2002   Hypertension    Melanoma (HCC) 10/2008   right shoulder and arm   Menorrhagia    Myocardial infarction [facility]) 1999   Patient Active Problem List   Diagnosis Date Noted   Bilateral hearing loss 09/30/2021   Impacted cerumen of right ear 09/30/2021   Hearing loss 07/16/2021   Chronic kidney disease, stage 3 unspecified (HCC) 03/25/2021   Pure hypercholesterolemia 09/17/2020   History of colonic polyps 09/17/2020   Incontinence of feces 09/17/2020   Diverticular disease of colon 09/17/2020   Colon cancer screening 09/17/2020   Change in bowel habit 09/17/2020   Anxiety 07/16/2020   Cervical disc disease 07/16/2020   Chronic reflux esophagitis 07/16/2020   Dysphagia 07/16/2020   Gastro-esophageal reflux disease without esophagitis 07/16/2020   Left lower quadrant pain 07/16/2020   Long term (current) use of anticoagulants 07/16/2020   Morbid obesity  (HCC) 07/16/2020   Osteoarthritis 07/16/2020   Other seasonal allergic rhinitis 07/16/2020   Other specified disorders of bone density and structure, other site 07/16/2020   Personal history of malignant melanoma of skin 07/16/2020   Prediabetes 07/16/2020   Recurrent major depression (HCC) 07/16/2020   Sleep apnea 07/16/2020   Stented coronary artery 07/16/2020   Vitamin D  deficiency 07/16/2020   Acute respiratory disease due to COVID-19 virus 07/09/2018   Coronary artery disease 07/09/2018   Depression 07/09/2018   Chronic diastolic heart failure (HCC) 07/09/2018   Suspected COVID-19 virus infection 07/09/2018   Heart failure (HCC) 07/09/2018   Pain in both feet 10/07/2016   Daytime somnolence 08/30/2014   Coronary atherosclerosis of native coronary artery 10/17/2013   Hyperlipidemia 10/17/2013   Essential hypertension 10/17/2013   Home Medication(s) Prior to Admission medications   Medication Sig Start Date End Date Taking? Authorizing Provider  dicyclomine (BENTYL) 20 MG tablet Take 1 tablet (20 mg total) by mouth 2 (two) times daily. 10/09/23  Yes Ninfa Giannelli, MD  ondansetron  (ZOFRAN -ODT) 4 MG disintegrating tablet Take 1 tablet (4 mg total) by mouth every 8 (eight) hours as needed for nausea or vomiting. 10/09/23  Yes Giani Betzold, MD  Aspirin  81 MG CAPS     [provider]  atorvastatin  (LIPITOR) 20 MG tablet TAKE 1 TABLET BY MOUTH  DAILY AT 6 PM 06/25/23   Okey Vina GAILS, MD  Biotin 5 MG CAPS     [provider]  ELIQUIS  5 MG TABS  tablet Take 1 tablet (5 mg total) by mouth 2 (two) times daily. 05/29/23   Okey Vina GAILS, MD  estradiol  (ESTRACE ) 0.5 MG tablet  04/04/21   [provider]  fluocinonide cream (LIDEX) 0.05 % Apply 1 application. topically 2 (two) times daily. 04/04/21   [provider]  furosemide  (LASIX ) 20 MG tablet Take 20 mg by mouth as needed.    [provider]  GAVILYTE-C 240 g solution Take by mouth. 03/24/22    [provider]  GLUCOSAMINE-CHONDROITIN DS PO Take 2,000 mg by mouth daily at 12 noon.    [provider]  hydrocortisone 2.5 % cream SMARTSIG:sparingly Topical Twice Daily 09/12/21   [provider]  isosorbide  mononitrate (IMDUR ) 30 MG 24 hr tablet Take 1 tablet (30 mg total) by mouth daily. 06/25/23   Okey Vina GAILS, MD  labetalol  (NORMODYNE ) 200 MG tablet Take 1 tablet (200 mg total) by mouth 2 (two) times daily. 05/29/23   Okey Vina GAILS, MD  lisinopril  (ZESTRIL ) 20 MG tablet Take 1 tablet (20 mg total) by mouth daily. 06/03/22   Dann Candyce RAMAN, MD  mometasone (ELOCON) 0.1 % cream Apply 1 application  topically daily. 04/04/21   [provider]  nitroGLYCERIN  (NITROSTAT ) 0.4 MG SL tablet DISSOLVE 1 TABLET UNDER THE TONGUE EVERY 5 MINUTES AS  NEEDED FOR CHEST PAIN. MAX  OF 3 TABLETS IN 15 MINUTES. CALL 911 IF PAIN PERSISTS. 04/24/22   Dann Candyce RAMAN, MD  NON FORMULARY Pain cream for arthritis sent to [facility], faxed 11/01/2018 [facility]    [provider]  pantoprazole (PROTONIX) 40 MG tablet Take 40 mg by mouth daily. 03/19/22   [provider]  scopolamine (TRANSDERM-SCOP) 1 MG/3DAYS 1 patch every 3 (three) days. 03/20/22   [provider]  Soft Lens Products (REWETTING DROPS) SOLN Place 1 drop 3 (three) times daily as needed into both eyes (for dry/irritated contact lenses).    [provider]  spironolactone  (ALDACTONE ) 25 MG tablet Take 1 tablet (25 mg total) by mouth daily. 06/25/23   Okey Vina GAILS, MD  VITAMIN D  PO Take 2,000 mg by mouth daily.    [provider]  Vitamin D , Ergocalciferol , (DRISDOL ) 1.25 MG (50000 UT) CAPS capsule TAKE 1 CAPSULE BY MOUTH WEEKLY, START VIT D 2,000 UNITS DAILY AFTER 60 DAYS 03/17/19   [provider]                                                                                                                                    Past Surgical History Past  Surgical History:  Procedure Laterality Date   ANGIOPLASTY  2000   APPENDECTOMY     bone chip removed from left foot      CHOLECYSTECTOMY     COLONOSCOPY WITH PROPOFOL  N/A 03/03/2017   Procedure: COLONOSCOPY WITH PROPOFOL ;  Surgeon: Kristie Lamprey, MD;  Location: [facility];  Service: Endoscopy;  Laterality: N/A;   CORONARY ANGIOPLASTY WITH STENT PLACEMENT  10/99   ESOPHAGOGASTRODUODENOSCOPY (EGD) WITH PROPOFOL  N/A 03/03/2017   Procedure: ESOPHAGOGASTRODUODENOSCOPY (EGD) WITH PROPOFOL ;  Surgeon: Kristie Lamprey, MD;  Location: [facility];  Service: Endoscopy;  Laterality: N/A;   HERNIA REPAIR     umbilical    HYSTEROSCOPY  2/98   D&C (polyps)   LEFT HEART CATHETERIZATION WITH CORONARY ANGIOGRAM N/A 01/25/2014   Procedure: LEFT HEART CATHETERIZATION WITH CORONARY ANGIOGRAM;  Surgeon: Candyce GORMAN Reek, MD;  Location: [facility] CATH LAB;  Service: Cardiovascular;  Laterality: N/A;   NODE DISSECTION     neg   TOTAL ABDOMINAL HYSTERECTOMY     LSO     Failed TVH   Family History Family History  Problem Relation Age of Onset   Diabetes Mother    Stroke Mother    Hypertension Mother    Heart disease Father    Hypertension Father    Cancer Father        pancreatic cancer   Heart attack Father     Social History Social History   Tobacco Use   Smoking status: Never   Smokeless tobacco: Never  Substance Use Topics   Alcohol use: Yes    Alcohol/week: 1.0 standard drink of alcohol    Types: 1 Standard drinks or equivalent per week    Comment: occ glass of wine   Drug use: No    Frequency: 5.0 times per week   Allergies Azithromycin, Other, Penicillins, Tetanus toxoids, Gadolinium derivatives, Morphine and codeine, Cefazolin, Horse-derived products, Latex, Penicillin g, and Amoxicillin-pot clavulanate  Review of Systems Review of Systems  Gastrointestinal:  Positive for abdominal pain, nausea and vomiting.    Physical Exam Vital Signs  I have reviewed the triage vital signs BP  (!) 148/85 (BP Location: Left Arm)   Pulse 87   Temp 97.8 F (36.6 C) (Oral)   Resp 16   SpO2 97%   Physical Exam Vitals and nursing note reviewed.  Constitutional:      General: She is not in acute distress.    Appearance: She is well-developed.  HENT:     Head: Normocephalic and atraumatic.   Eyes:     Conjunctiva/sclera: Conjunctivae normal.    Cardiovascular:     Rate and Rhythm: Normal rate and regular rhythm.     Heart sounds: No murmur heard. Pulmonary:     Effort: Pulmonary effort is normal. No respiratory distress.     Breath sounds: Normal breath sounds.  Abdominal:     Palpations: Abdomen is soft.     Tenderness: There is abdominal tenderness.   Musculoskeletal:        General: No swelling.     Cervical back: Neck supple.   Skin:    General: Skin is warm and dry.     Capillary Refill: Capillary refill takes less than 2 seconds.   Neurological:     Mental Status: She is alert.   Psychiatric:        Mood and Affect: Mood normal.     ED Results and Treatments Labs (all labs ordered are listed, but only abnormal results are displayed) Labs Reviewed  CBC WITH DIFFERENTIAL/PLATELET - Abnormal; Notable for the following components:      Result Value   WBC 14.3 (*)    Hemoglobin 15.7 (*)    HCT 47.8 (*)    Neutro Abs 11.5 (*)    Abs Immature Granulocytes 0.19 (*)    All other components within normal  limits  COMPREHENSIVE METABOLIC PANEL WITH GFR - Abnormal; Notable for the following components:   CO2 15 (*)    Glucose, Bld 155 (*)    BUN 38 (*)    Creatinine, Ser 1.61 (*)    Total Protein 6.3 (*)    Total Bilirubin 1.6 (*)    GFR, Estimated 33 (*)    Anion gap 17 (*)    All other components within normal limits  LIPASE, BLOOD                                                                                                                          Radiology CT ABDOMEN PELVIS W CONTRAST Result Date: 10/09/2023 CLINICAL DATA:  LLQ abdominal pain.  EXAM: CT ABDOMEN AND PELVIS WITH CONTRAST TECHNIQUE: Multidetector CT imaging of the abdomen and pelvis was performed using the standard protocol following bolus administration of intravenous contrast. RADIATION DOSE REDUCTION: This exam was performed according to the departmental dose-optimization program which includes automated exposure control, adjustment of the mA and/or kV according to patient size and/or use of iterative reconstruction technique. CONTRAST:  75mL OMNIPAQUE  IOHEXOL  350 MG/ML SOLN COMPARISON:  CT angiography chest from 07/09/2018. FINDINGS: Lower chest: There is a lobulated 6 x 7 mm noncalcified nodule in the left lung lower lobe (series 5, image 17), which is present since the prior study from 07/09/2018. The lung bases are otherwise clear. No pleural effusion. The heart is normal in size. No pericardial effusion. Dense mitral annulus calcifications noted. Hepatobiliary: The liver is normal in size. Non-cirrhotic configuration. No suspicious mass. These is mild diffuse hepatic steatosis. No intrahepatic or extrahepatic bile duct dilation. Gallbladder is surgically absent. Pancreas: Unremarkable. No pancreatic ductal dilatation or surrounding inflammatory changes. Spleen: Within normal limits. No focal lesion. Adrenals/Urinary Tract: Adrenal glands are unremarkable. No suspicious renal mass. No hydronephrosis. No renal or ureteric calculi. Unremarkable urinary bladder. Stomach/Bowel: No disproportionate dilation of the small or large bowel loops. No evidence of abnormal bowel wall thickening or inflammatory changes. The appendix was not visualized; however there is no acute inflammatory process in the right lower quadrant. There are multiple diverticula mainly in the sigmoid colon, without imaging signs of diverticulitis. Vascular/Lymphatic: No ascites or pneumoperitoneum. No abdominal or pelvic lymphadenopathy, by size criteria. No aneurysmal dilation of the major abdominal arteries. There are  mild peripheral atherosclerotic vascular calcifications of the aorta and its major branches. Reproductive: The uterus is surgically absent. No large adnexal mass. Other: The visualized soft tissues and abdominal wall are unremarkable. Musculoskeletal: No suspicious osseous lesions. There are mild multilevel degenerative changes in the visualized spine. IMPRESSION: 1. No acute inflammatory process identified within the abdomen or pelvis. 2. Multiple other nonacute observations, as described above. Aortic Atherosclerosis (ICD10-I70.0). Electronically Signed   By: Ree Molt M.D.   On: 10/09/2023 16:59    Pertinent labs & imaging results that were available during my care of the patient were reviewed by me and considered in my medical  decision making (see MDM for details).  Medications Ordered in ED Medications  lactated ringers  bolus 1,000 mL (0 mLs Intravenous Stopped 10/09/23 1910)  ondansetron  (ZOFRAN ) injection 4 mg (4 mg Intravenous Given 10/09/23 1658)  iohexol  (OMNIPAQUE ) 350 MG/ML injection 75 mL (75 mLs Intravenous Contrast Given 10/09/23 1650)                                                                                                                                     Procedures Procedures  (including critical care time)  Medical Decision Making / ED Course   This patient presents to the ED for concern of abdominal pain nausea vomiting, this involves an extensive number of treatment options, and is a complaint that carries with it a high risk of complications and morbidity.  The differential diagnosis includes diverticulitis, epiploic appendagitis, colitis, gastroenteritis, constipation, nephrolithiasis, inflammatory bowel disease,   MDM: Patient seen in the emergency room for evaluation of abdominal pain nausea and vomiting.  Physical exam with some mild tenderness in the upper quadrant is otherwise unremarkable.  Laboratory evaluation with leukocytosis to 14.3, CO2 15, BUN 38,  creatinine 1.61 but is otherwise unremarkable.  CT abdomen pelvis unremarkable.  Patient received fluid resuscitation and Zofran  and on reevaluation her symptoms have improved.  She is able to tolerate p.o. without difficulty here in the ER.  I did attempt to obtain a lactic acid measurement and VBG given decreased bicarb but patient is stating that she feels improved and would like to be discharged prior to completion of these labs which is not unreasonable.  She will be discharged with Zofran  and Bentyl and I will follow-up strict return cautions of which she voiced understanding.  Of note, I have low suspicion for new esophageal stricture as she was able to tolerate p.o. here in the emergency department after antiemetics which is not consistent with significant esophageal stricture or esophageal impaction.   Additional history obtained: -Additional history obtained from husband -External records from outside source obtained and reviewed including: Chart review including previous notes, labs, imaging, consultation notes   Lab Tests: -I ordered, reviewed, and interpreted labs.   The pertinent results include:   Labs Reviewed  CBC WITH DIFFERENTIAL/PLATELET - Abnormal; Notable for the following components:      Result Value   WBC 14.3 (*)    Hemoglobin 15.7 (*)    HCT 47.8 (*)    Neutro Abs 11.5 (*)    Abs Immature Granulocytes 0.19 (*)    All other components within normal limits  COMPREHENSIVE METABOLIC PANEL WITH GFR - Abnormal; Notable for the following components:   CO2 15 (*)    Glucose, Bld 155 (*)    BUN 38 (*)    Creatinine, Ser 1.61 (*)    Total Protein 6.3 (*)    Total Bilirubin 1.6 (*)    GFR, Estimated 33 (*)    Anion gap 17 (*)  All other components within normal limits  LIPASE, BLOOD      Imaging Studies ordered: I ordered imaging studies including CTAP I independently visualized and interpreted imaging. I agree with the radiologist interpretation   Medicines  ordered and prescription drug management: Meds ordered this encounter  Medications   lactated ringers  bolus 1,000 mL   ondansetron  (ZOFRAN ) injection 4 mg   iohexol  (OMNIPAQUE ) 350 MG/ML injection 75 mL   DISCONTD: ondansetron  (ZOFRAN ) injection 4 mg   DISCONTD: dicyclomine (BENTYL) injection 20 mg   ondansetron  (ZOFRAN -ODT) 4 MG disintegrating tablet    Sig: Take 1 tablet (4 mg total) by mouth every 8 (eight) hours as needed for nausea or vomiting.    Dispense:  20 tablet    Refill:  0   dicyclomine (BENTYL) 20 MG tablet    Sig: Take 1 tablet (20 mg total) by mouth 2 (two) times daily.    Dispense:  20 tablet    Refill:  0    -I have reviewed the patients home medicines and have made adjustments as needed  Critical interventions none   Cardiac Monitoring: The patient was maintained on a cardiac monitor.  I personally viewed and interpreted the cardiac monitored which showed an underlying rhythm of: NSR  Social Determinants of Health:  Factors impacting patients care include: none   Reevaluation: After the interventions noted above, I reevaluated the patient and found that they have :improved  Co morbidities that complicate the patient evaluation  Past Medical History:  Diagnosis Date   Anxiety    Arthritis    Bone spur    Coronary artery disease    stent - 1999   Depression    Fibrocystic breast changes    GERD (gastroesophageal reflux disease)    Glucosuria    Hernia, inguinal, left 10/2002   Hypertension    Melanoma (HCC) 10/2008   right shoulder and arm   Menorrhagia    Myocardial infarction (HCC) 1999      Dispostion: I considered admission for this patient, but at this time she does not meet inpatient criteria for admission and will be discharged with outpatient follow-up.     Final Clinical Impression(s) / ED Diagnoses Final diagnoses:  Nausea and vomiting, unspecified vomiting type     @PCDICTATION @    Albertina Dixon, MD 10/10/23 0111

## 2023-10-11 ENCOUNTER — Other Ambulatory Visit: Payer: Self-pay | Admitting: Internal Medicine

## 2023-10-11 DIAGNOSIS — I4891 Unspecified atrial fibrillation: Secondary | ICD-10-CM

## 2023-10-12 NOTE — Telephone Encounter (Signed)
 Prescription refill request for Eliquis  received. Indication:afib Last office visit:12/24 Scr:1.61  6/25 Age: 78 Weight:85.3  kg  Prescription refilled

## 2023-10-13 DIAGNOSIS — I480 Paroxysmal atrial fibrillation: Secondary | ICD-10-CM | POA: Diagnosis not present

## 2023-10-13 DIAGNOSIS — D72829 Elevated white blood cell count, unspecified: Secondary | ICD-10-CM | POA: Diagnosis not present

## 2023-10-13 DIAGNOSIS — R944 Abnormal results of kidney function studies: Secondary | ICD-10-CM | POA: Diagnosis not present

## 2023-10-13 DIAGNOSIS — R112 Nausea with vomiting, unspecified: Secondary | ICD-10-CM | POA: Diagnosis not present

## 2023-10-15 DIAGNOSIS — I959 Hypotension, unspecified: Secondary | ICD-10-CM | POA: Diagnosis not present

## 2023-10-15 DIAGNOSIS — I480 Paroxysmal atrial fibrillation: Secondary | ICD-10-CM | POA: Diagnosis not present

## 2023-10-15 DIAGNOSIS — E871 Hypo-osmolality and hyponatremia: Secondary | ICD-10-CM | POA: Diagnosis not present

## 2023-10-19 DIAGNOSIS — I480 Paroxysmal atrial fibrillation: Secondary | ICD-10-CM | POA: Diagnosis not present

## 2023-10-19 DIAGNOSIS — E871 Hypo-osmolality and hyponatremia: Secondary | ICD-10-CM | POA: Diagnosis not present

## 2023-10-19 DIAGNOSIS — N1832 Chronic kidney disease, stage 3b: Secondary | ICD-10-CM | POA: Diagnosis not present

## 2023-10-27 ENCOUNTER — Encounter: Payer: Self-pay | Admitting: Nurse Practitioner

## 2023-10-27 ENCOUNTER — Ambulatory Visit

## 2023-10-27 ENCOUNTER — Ambulatory Visit: Attending: Cardiology | Admitting: Nurse Practitioner

## 2023-10-27 VITALS — BP 103/72 | HR 116 | Ht 62.0 in | Wt 182.5 lb

## 2023-10-27 DIAGNOSIS — I10 Essential (primary) hypertension: Secondary | ICD-10-CM

## 2023-10-27 DIAGNOSIS — I4892 Unspecified atrial flutter: Secondary | ICD-10-CM | POA: Diagnosis not present

## 2023-10-27 DIAGNOSIS — I480 Paroxysmal atrial fibrillation: Secondary | ICD-10-CM

## 2023-10-27 DIAGNOSIS — R7303 Prediabetes: Secondary | ICD-10-CM

## 2023-10-27 DIAGNOSIS — I5032 Chronic diastolic (congestive) heart failure: Secondary | ICD-10-CM

## 2023-10-27 DIAGNOSIS — I2510 Atherosclerotic heart disease of native coronary artery without angina pectoris: Secondary | ICD-10-CM | POA: Diagnosis not present

## 2023-10-27 DIAGNOSIS — E785 Hyperlipidemia, unspecified: Secondary | ICD-10-CM | POA: Diagnosis not present

## 2023-10-27 MED ORDER — APIXABAN 5 MG PO TABS: 5.0000 mg | ORAL_TABLET | Freq: Two times a day (BID) | ORAL | 0 refills | Status: DC

## 2023-10-27 MED ORDER — LISINOPRIL 10 MG PO TABS: 10.0000 mg | ORAL_TABLET | Freq: Every day | ORAL | 3 refills | Status: DC

## 2023-10-27 MED ORDER — METOPROLOL TARTRATE 25 MG PO TABS: 25.0000 mg | ORAL_TABLET | Freq: Two times a day (BID) | ORAL | 3 refills | Status: DC

## 2023-10-27 MED ORDER — SPIRONOLACTONE 25 MG PO TABS: 12.5000 mg | ORAL_TABLET | Freq: Every day | ORAL | Status: DC

## 2023-10-27 NOTE — Progress Notes (Unsigned)
 Applied a 7 day Zio XT monitor to patient in the office  [employer]

## 2023-10-27 NOTE — Patient Instructions (Addendum)
 Medication Instructions:  Decrease Spironolactone  12.5 mg daily Decrease Lisinopril  10 mg daily  *If you need a refill on your cardiac medications before your next appointment, please call your pharmacy*  Lab Work: NONE ordered at this time of appointment   Testing/Procedures: Your physician has requested that you have an echocardiogram. Echocardiography is a painless test that uses sound waves to create images of your heart. It provides your doctor with information about the size and shape of your heart and how well your heart's chambers and valves are working. This procedure takes approximately one hour. There are no restrictions for this procedure. Please do NOT wear cologne, perfume, aftershave, or lotions (deodorant is allowed). Please arrive 15 minutes prior to your appointment time.  Please note: We ask at that you not bring children with you during ultrasound (echo/ vascular) testing. Due to room size and safety concerns, children are not allowed in the ultrasound rooms during exams. Our front office staff cannot provide observation of children in our lobby area while testing is being conducted. An adult accompanying a patient to their appointment will only be allowed in the ultrasound room at the discretion of the ultrasound technician under special circumstances. We apologize for any inconvenience.  ZIO XT- Long Term Monitor Instructions  Your physician has requested you wear a ZIO patch monitor for 7 days.  This is a single patch monitor. [employer] supplies one patch monitor per enrollment. Additional stickers are not available. Please do not apply patch if you will be having a Nuclear Stress Test,  Echocardiogram, Cardiac CT, MRI, or Chest Xray during the period you would be wearing the  monitor. The patch cannot be worn during these tests. You cannot remove and re-apply the  ZIO XT patch monitor.  Your ZIO patch monitor will be mailed 3 day USPS to your address on file. It may take  3-5 days  to receive your monitor after you have been enrolled.  Once you have received your monitor, please review the enclosed instructions. Your monitor  has already been registered assigning a specific monitor serial # to you.  Billing and Patient Assistance Program Information  We have supplied [employer] with any of your insurance information on file for billing purposes. [employer] offers a sliding scale Patient Assistance Program for patients that do not have  insurance, or whose insurance does not completely cover the cost of the ZIO monitor.  You must apply for the Patient Assistance Program to qualify for this discounted rate.  To apply, please call [employer] at [phone], select option 4, select option 2, ask to apply for  Patient Assistance Program. Meredeth will ask your household income, and how many people  are in your household. They will quote your out-of-pocket cost based on that information.  [employer] will also be able to set up a 58-month, interest-free payment plan if needed.  Applying the monitor   Shave hair from upper left chest.  Hold abrader disc by orange tab. Rub abrader in 40 strokes over the upper left chest as  indicated in your monitor instructions.  Clean area with 4 enclosed alcohol pads. Let dry.  Apply patch as indicated in monitor instructions. Patch will be placed under collarbone on left  side of chest with arrow pointing upward.  Rub patch adhesive wings for 2 minutes. Remove white label marked 1. Remove the white  label marked 2. Rub patch adhesive wings for 2 additional minutes.  While looking in a mirror, press and release button in center  of patch. A small green light will  flash 3-4 times. This will be your only indicator that the monitor has been turned on.  Do not shower for the first 24 hours. You may shower after the first 24 hours.  Press the button if you feel a symptom. You will hear a small click. Record Date, Time and  Symptom in the  Patient Logbook.  When you are ready to remove the patch, follow instructions on the last 2 pages of Patient  Logbook. Stick patch monitor onto the last page of Patient Logbook.  Place Patient Logbook in the blue and white box. Use locking tab on box and tape box closed  securely. The blue and white box has prepaid postage on it. Please place it in the mailbox as  soon as possible. Your physician should have your test results approximately 7 days after the  monitor has been mailed back to [facility].  Call [facility] Customer Care at [phone] if you have questions regarding  your ZIO XT patch monitor. Call them immediately if you see an orange light blinking on your  monitor.  If your monitor falls off in less than 4 days, contact our Monitor department at [phone].  If your monitor becomes loose or falls off after 4 days call [employer] at [phone] for  suggestions on securing your monitor    Follow-Up: At [facility], you and your health needs are our priority.  As part of our continuing mission to provide you with exceptional heart care, our providers are all part of one team.  This team includes your primary Cardiologist (physician) and Advanced Practice Providers or APPs (Physician Assistants and Nurse Practitioners) who all work together to provide you with the care you need, when you need it.  Your next appointment:   3 month(s)  Provider:   Vina Gull, MD or Damien Braver, NP          We recommend signing up for the patient portal called MyChart.  Sign up information is provided on this After Visit Summary.  MyChart is used to connect with patients for Virtual Visits (Telemedicine).  Patients are able to view lab/test results, encounter notes, upcoming appointments, etc.  Non-urgent messages can be sent to your provider as well.   To learn more about what you can do with MyChart, go to [email or web address].   Other Instructions [facility] will  call you with the next available appointment.

## 2023-10-27 NOTE — Progress Notes (Unsigned)
 Office Visit    Patient Name: [name] Date of Encounter: 10/27/2023  Primary Care Provider:  Dayna Motto, DO Primary Cardiologist:  Vina Gull, MD  Chief Complaint    78 year old female with a history of CAD s/p PCI/stenting-LAD in 1999 and 2002, chronic diastolic heart failure, PAF, hypertension, hyperlipidemia, prediabetes, esophageal stricture, and GERD who presents for follow-up related to CAD and atrial fibrillation.  Past Medical History    Past Medical History:  Diagnosis Date   Anxiety    Arthritis    Bone spur    Coronary artery disease    stent - 1999   Depression    Fibrocystic breast changes    GERD (gastroesophageal reflux disease)    Glucosuria    Hernia, inguinal, left 10/2002   Hypertension    Melanoma (HCC) 10/2008   right shoulder and arm   Menorrhagia    Myocardial infarction (HCC) 1999   Past Surgical History:  Procedure Laterality Date   ANGIOPLASTY  2000   APPENDECTOMY     bone chip removed from left foot      CHOLECYSTECTOMY     COLONOSCOPY WITH PROPOFOL  N/A 03/03/2017   Procedure: COLONOSCOPY WITH PROPOFOL ;  Surgeon: Kristie Lamprey, MD;  Location: [facility];  Service: Endoscopy;  Laterality: N/A;   CORONARY ANGIOPLASTY WITH STENT PLACEMENT  10/99   ESOPHAGOGASTRODUODENOSCOPY (EGD) WITH PROPOFOL  N/A 03/03/2017   Procedure: ESOPHAGOGASTRODUODENOSCOPY (EGD) WITH PROPOFOL ;  Surgeon: Kristie Lamprey, MD;  Location: [facility] ENDOS[facility];  Service: Endoscopy;  Laterality: N/A;   HERNIA REPAIR     umbilical    HYSTEROSCOPY  2/98   D&C (polyps)   LEFT HEART CATHETERIZATION WITH CORONARY ANGIOGRAM N/A 01/25/2014   Procedure: LEFT HEART CATHETERIZATION WITH CORONARY ANGIOGRAM;  Surgeon: Candyce GORMAN Reek, MD;  Location: [facility] CATH LAB;  Service: Cardiovascular;  Laterality: N/A;   NODE DISSECTION     neg   TOTAL ABDOMINAL HYSTERECTOMY     LSO     Failed TVH    Allergies  Allergies  Allergen Reactions   Azithromycin Swelling    Other  reaction(s): swelling   Other Itching, Swelling, Other (See Comments) and Cough    Horse products Other reaction(s): reaction Other reaction(s): Unknown Other reaction(s): rash   Penicillins Swelling    Has patient had a PCN reaction causing immediate rash, facial/tongue/throat swelling, SOB or lightheadedness with hypotension: Yes Has patient had a PCN reaction causing severe rash involving mucus membranes or skin necrosis: No Has patient had a PCN reaction that required hospitalization: No Has patient had a PCN reaction occurring within the last 10 years: No If all of the above answers are NO, then may proceed with Cephalosporin use.  Other reaction(s): rash Other reaction(s): rash   Tetanus Toxoids Swelling    Other reaction(s): sickness   Gadolinium Derivatives Hives and Itching    Pt stated that her left arm was itching and I noticed that she had two hives on her chest. No difficulty breathing, sneezing. One 25mg  of benadryl was ordered by Dr. Trudy. Pt remained at facility and was monitored before going home. -ldrake   Morphine And Codeine Nausea And Vomiting   Cefazolin     Other reaction(s): rash Other reaction(s): rash   Horse-Derived Products     Other reaction(s): reaction   Latex    Penicillin G     Other reaction(s): Unknown   Amoxicillin-Pot Clavulanate Rash    Has patient had a PCN reaction causing immediate rash, facial/tongue/throat swelling, SOB  or lightheadedness with hypotension: Yes Has patient had a PCN reaction causing severe rash involving mucus membranes or skin necrosis: Yes Has patient had a PCN reaction that required hospitalization:No Has patient had a PCN reaction occurring within the last 10 years: No If all of the above answers are NO, then may proceed with Cephalosporin use.  Other reaction(s): rash, swelling     Labs/Other Studies Reviewed    The following studies were reviewed today:  Cardiac Studies & Procedures    ______________________________________________________________________________________________   STRESS TESTS  MYOCARDIAL PERFUSION IMAGING 04/11/2021  Interpretation Summary   The study is normal. The study is low risk.   No ST deviation was noted.   LV perfusion is normal. There is no evidence of ischemia. There is no evidence of infarction.   Left ventricular function is normal. Nuclear stress EF: 71 %. The left ventricular ejection fraction is hyperdynamic (>65%). End diastolic cavity size is normal. End systolic cavity size is normal.   Prior study available for comparison from 11/04/2011. No changes compared to prior study. Normal perfusion. LVEF 86%  Normal perfusion. LVEF 71% with normal wall motion. This is a low risk study. No change compared to prior study in 2013.   ECHOCARDIOGRAM  ECHOCARDIOGRAM COMPLETE 04/17/2021  Narrative ECHOCARDIOGRAM REPORT    Patient Name:   [name] Date of Exam: 04/17/2021 Medical Rec #:  [id number]       Height:       63.0 in Accession #:    [id number]      Weight:       202.0 lb Date of Birth:  07-Nov-1945      BSA:          1.942 m Patient Age:    75 years        BP:           108/60 mmHg Patient Gender: F               HR:           63 bpm. Exam Location:  [address]  Procedure: 2D Echo, Cardiac Doppler and Color Doppler  Indications:    R06.00 Dyspnea  History:        Patient has no prior history of Echocardiogram examinations. Previous Myocardial Infarction and CAD; Risk Factors:Hypertension.  Sonographer:    Carl Rodgers-Jones RDCS Referring Phys: [id number] JAYADEEP S VARANASI  IMPRESSIONS   1. Left ventricular ejection fraction, by estimation, is 60 to 65%. The left ventricle has normal function. The left ventricle has no regional wall motion abnormalities. Left ventricular diastolic parameters are consistent with Grade I diastolic dysfunction (impaired relaxation). Elevated left atrial pressure. 2. Right  ventricular systolic function is normal. The right ventricular size is normal. 3. Left atrial size was severely dilated. 4. Right atrial size was mildly dilated. 5. The mitral valve is normal in structure. Mild mitral valve regurgitation. No evidence of mitral stenosis. Severe mitral annular calcification. 6. The aortic valve is normal in structure. Aortic valve regurgitation is not visualized. No aortic stenosis is present. 7. The inferior vena cava is normal in size with greater than 50% respiratory variability, suggesting right atrial pressure of 3 mmHg.  FINDINGS Left Ventricle: Left ventricular ejection fraction, by estimation, is 60 to 65%. The left ventricle has normal function. The left ventricle has no regional wall motion abnormalities. The left ventricular internal cavity size was normal in size. There is no left ventricular hypertrophy. Left ventricular diastolic parameters are consistent  with Grade I diastolic dysfunction (impaired relaxation). Elevated left atrial pressure.  Right Ventricle: The right ventricular size is normal. No increase in right ventricular wall thickness. Right ventricular systolic function is normal.  Left Atrium: Left atrial size was severely dilated.  Right Atrium: Right atrial size was mildly dilated.  Pericardium: There is no evidence of pericardial effusion.  Mitral Valve: The mitral valve is normal in structure. Severe mitral annular calcification. Mild mitral valve regurgitation. No evidence of mitral valve stenosis. MV peak gradient, 9.1 mmHg.  Tricuspid Valve: The tricuspid valve is normal in structure. Tricuspid valve regurgitation is not demonstrated. No evidence of tricuspid stenosis.  Aortic Valve: The aortic valve is normal in structure. Aortic valve regurgitation is not visualized. No aortic stenosis is present.  Pulmonic Valve: The pulmonic valve was normal in structure. Pulmonic valve regurgitation is not visualized. No evidence of  pulmonic stenosis.  Aorta: The aortic root is normal in size and structure.  Venous: The inferior vena cava is normal in size with greater than 50% respiratory variability, suggesting right atrial pressure of 3 mmHg.  IAS/Shunts: No atrial level shunt detected by color flow Doppler.   LEFT VENTRICLE PLAX 2D LVIDd:         4.50 cm   Diastology LVIDs:         2.70 cm   LV e' medial:    7.96 cm/s LV PW:         0.90 cm   LV E/e' medial:  16.2 LV IVS:        0.90 cm   LV e' lateral:   7.46 cm/s LVOT diam:     1.80 cm   LV E/e' lateral: 17.3 LV SV:         57 LV SV Index:   29 LVOT Area:     2.54 cm   RIGHT VENTRICLE RV Basal diam:  2.90 cm RV S prime:     14.00 cm/s TAPSE (M-mode): 2.1 cm  LEFT ATRIUM             Index        RIGHT ATRIUM          Index LA diam:        5.90 cm 3.04 cm/m   RA Area:     8.48 cm LA Vol (A2C):   32.6 ml 16.79 ml/m  RA Volume:   16.40 ml 8.45 ml/m LA Vol (A4C):   80.8 ml 41.61 ml/m LA Biplane Vol: 55.9 ml 28.79 ml/m AORTIC VALVE LVOT Vmax:   95.25 cm/s LVOT Vmean:  62.650 cm/s LVOT VTI:    0.223 m  AORTA Ao Root diam: 2.70 cm Ao Asc diam:  2.90 cm  MITRAL VALVE MV Area (PHT): 3.12 cm     SHUNTS MV Peak grad:  9.1 mmHg     Systemic VTI:  0.22 m MV Vmax:       1.51 m/s     Systemic Diam: 1.80 cm MV Decel Time: 243 msec MV E velocity: 129.00 cm/s MV A velocity: 143.00 cm/s MV E/A ratio:  0.90  Oneil Parchment MD Electronically signed by Oneil Parchment MD Signature Date/Time: 04/17/2021/4:05:35 PM    Final          ______________________________________________________________________________________________     Recent Labs: 10/09/2023: ALT 31; BUN 38; Creatinine, Ser 1.61; Hemoglobin 15.7; Platelets 283; Potassium 4.1; Sodium 137  Recent Lipid Panel No results found for: CHOL, TRIG, HDL, CHOLHDL, VLDL, LDLCALC, LDLDIRECT  History of Present  Illness    78 year old female with the above past medical history  including CAD s/p PCI/stenting-LAD in 1999 and 2002, chronic diastolic heart failure, PAF, hypertension, hyperlipidemia, prediabetes, esophageal stricture, and GERD.  Previously followed by Dr. Dann, she later established with Dr. Okey.  She has a history of CAD s/p PCI/prior stenting to LAD in 1999 in 2002.  Cardiac catheterization in 2015 showed stable CAD.  Myoview  in 2022 was negative for ischemia.  Most recent echocardiogram in 04/2021 30 EF 60 to 65%, normal LV function, no RWMA, G1 DD, normal RV systolic function, mild mitral valve regurgitation. She has a history of paroxysmal atrial fibrillation on Eliquis .  She was last seen in the office on 03/24/2023 and was stable from a cardiac standpoint.  She was evaluated in the ED on 10/09/2023 in the setting of abdominal pain, nausea, vomiting.  CT of the abdomen pelvis was unremarkable. EKG showed normal sinus rhythm. She was treated with supportive measures.  She presents today for follow-up. Since her last visit Accompanied by her husband.  Over the last 2 to 3 months she has noticed increased fatigue, low BP.  She was treated for bronchitis.  She also received several steroid injections in knees.  Since this time she has been having more frequent breakthrough atrial fibrillation.  She had labs drawn with her PCP last week, will request copy.  If no TSH or magnesium  will update.  She was noted to have low sodium.  Heart rate is elevated in office today.  She has also lost over 30 pounds in the last year.  Suspect this is also contributed to lower BP readings.  We discussed possible need for cardioversion.  We discussed referral to A-fib clinic, she is agreeable.  She may be a candidate for ablation.  She is taking labetalol  200 mg twice daily.  Heart rate mildly elevated in office today.  BP is low.  Will decrease lisinopril  to 10 mg daily, will decrease prolactin to 12.5 mg daily.  Continue to monitor BP/HR.  She is requesting Eliquis  samples.  Continue  to monitor BP and report BP consistently greater than 130/80, SBP less than 100 mmHg.  Will send referral to A-fib clinic.  Will update echocardiogram.  Will check 7-day ZIO monitor. Refer to a fib clinic. Follow-up in 3 months.  CAD: Chronic diastolic heart failure: PAF: Hypertension: Hyperlipidemia: Prediabetes: Disposition:  Home Medications    Current Outpatient Medications  Medication Sig Dispense Refill   Aspirin  81 MG CAPS      atorvastatin  (LIPITOR) 20 MG tablet TAKE 1 TABLET BY MOUTH  DAILY AT 6 PM 90 tablet 2   Biotin 5 MG CAPS      dicyclomine  (BENTYL ) 20 MG tablet Take 1 tablet (20 mg total) by mouth 2 (two) times daily. 20 tablet 0   ELIQUIS  5 MG TABS tablet TAKE 1 TABLET BY MOUTH TWICE  DAILY 200 tablet 2   estradiol  (ESTRACE ) 0.5 MG tablet      fluocinonide cream (LIDEX) 0.05 % Apply 1 application. topically 2 (two) times daily.     furosemide  (LASIX ) 20 MG tablet Take 20 mg by mouth as needed.     GAVILYTE-C 240 g solution Take by mouth.     GLUCOSAMINE-CHONDROITIN DS PO Take 2,000 mg by mouth daily at 12 noon.     hydrocortisone 2.5 % cream SMARTSIG:sparingly Topical Twice Daily     isosorbide  mononitrate (IMDUR ) 30 MG 24 hr tablet Take 1 tablet (30 mg total) by  mouth daily. 90 tablet 2   labetalol  (NORMODYNE ) 200 MG tablet Take 1 tablet (200 mg total) by mouth 2 (two) times daily. 180 tablet 3   lisinopril  (ZESTRIL ) 20 MG tablet Take 1 tablet (20 mg total) by mouth daily. 90 tablet 3   mometasone (ELOCON) 0.1 % cream Apply 1 application  topically daily.     nitroGLYCERIN  (NITROSTAT ) 0.4 MG SL tablet DISSOLVE 1 TABLET UNDER THE TONGUE EVERY 5 MINUTES AS  NEEDED FOR CHEST PAIN. MAX  OF 3 TABLETS IN 15 MINUTES. CALL 911 IF PAIN PERSISTS. 125 tablet 1   NON FORMULARY Pain cream for arthritis sent to [facility], faxed 11/01/2018 HS-CMA     ondansetron  (ZOFRAN -ODT) 4 MG disintegrating tablet Take 1 tablet (4 mg total) by mouth every 8 (eight) hours as needed for  nausea or vomiting. 20 tablet 0   pantoprazole (PROTONIX) 40 MG tablet Take 40 mg by mouth daily.     scopolamine (TRANSDERM-SCOP) 1 MG/3DAYS 1 patch every 3 (three) days.     Soft Lens Products (REWETTING DROPS) SOLN Place 1 drop 3 (three) times daily as needed into both eyes (for dry/irritated contact lenses).     spironolactone  (ALDACTONE ) 25 MG tablet Take 1 tablet (25 mg total) by mouth daily. 90 tablet 2   VITAMIN D  PO Take 2,000 mg by mouth daily.     Vitamin D , Ergocalciferol , (DRISDOL ) 1.25 MG (50000 UT) CAPS capsule TAKE 1 CAPSULE BY MOUTH WEEKLY, START VIT D 2,000 UNITS DAILY AFTER 60 DAYS     No current facility-administered medications for this visit.     Review of Systems    ***.  All other systems reviewed and are otherwise negative except as noted above.    Physical Exam    VS:  BP 103/72   Pulse (!) 116   Ht 5' 2 (1.575 m)   Wt 182 lb 8 oz (82.8 kg)   SpO2 97%   BMI 33.38 kg/m  , BMI Body mass index is 33.38 kg/m.     GEN: Well nourished, well developed, in no acute distress. HEENT: normal. Neck: Supple, no JVD, carotid bruits, or masses. Cardiac: RRR, no murmurs, rubs, or gallops. No clubbing, cyanosis, edema.  Radials/DP/PT 2+ and equal bilaterally.  Respiratory:  Respirations regular and unlabored, clear to auscultation bilaterally. GI: Soft, nontender, nondistended, BS + x 4. MS: no deformity or atrophy. Skin: warm and dry, no rash. Neuro:  Strength and sensation are intact. Psych: Normal affect.  Accessory Clinical Findings    ECG personally reviewed by me today - EKG Interpretation Date/Time:  Tuesday October 27 2023 11:56:22 EDT Ventricular Rate:  116 PR Interval:    QRS Duration:  82 QT Interval:  322 QTC Calculation: 447 R Axis:   7  Text Interpretation: Atrial flutter with variable A-V block Nonspecific T wave abnormality When compared with ECG of 24-Mar-2023 09:37, Atrial flutter has replaced Sinus rhythm Vent. rate has increased BY  56 BPM  Nonspecific T wave abnormality, worse in Inferior leads Nonspecific T wave abnormality now evident in Lateral leads Confirmed by Daneen Perkins ([id number]) on 10/27/2023 12:17:07 PM  - no acute changes.   Lab Results  Component Value Date   WBC 14.3 (H) 10/09/2023   HGB 15.7 (H) 10/09/2023   HCT 47.8 (H) 10/09/2023   MCV 96.2 10/09/2023   PLT 283 10/09/2023   Lab Results  Component Value Date   CREATININE 1.61 (H) 10/09/2023   BUN 38 (H) 10/09/2023  NA 137 10/09/2023   K 4.1 10/09/2023   CL 105 10/09/2023   CO2 15 (L) 10/09/2023   Lab Results  Component Value Date   ALT 31 10/09/2023   AST 20 10/09/2023   ALKPHOS 56 10/09/2023   BILITOT 1.6 (H) 10/09/2023   No results found for: CHOL, HDL, LDLCALC, LDLDIRECT, TRIG, CHOLHDL  No results found for: HGBA1C  Assessment & Plan    1.  ***      Damien JAYSON Braver, NP 10/27/2023, 12:17 PM

## 2023-10-28 ENCOUNTER — Encounter: Payer: Self-pay | Admitting: Nurse Practitioner

## 2023-10-30 ENCOUNTER — Ambulatory Visit (HOSPITAL_COMMUNITY)
Admission: RE | Admit: 2023-10-30 | Discharge: 2023-10-30 | Disposition: A | Discharge status: Home / Self Care | Source: Ambulatory Visit | Attending: Internal Medicine | Admitting: Internal Medicine

## 2023-10-30 ENCOUNTER — Encounter (HOSPITAL_COMMUNITY): Payer: Self-pay | Admitting: Internal Medicine

## 2023-10-30 VITALS — BP 110/80 | HR 122 | Ht 62.0 in | Wt 183.8 lb

## 2023-10-30 DIAGNOSIS — D6869 Other thrombophilia: Secondary | ICD-10-CM | POA: Diagnosis not present

## 2023-10-30 DIAGNOSIS — I480 Paroxysmal atrial fibrillation: Secondary | ICD-10-CM

## 2023-10-30 DIAGNOSIS — I4819 Other persistent atrial fibrillation: Secondary | ICD-10-CM

## 2023-10-30 NOTE — Patient Instructions (Addendum)
 Cardioversion scheduled for: Monday, July 21st   - Arrive at the [facility] ([address])  and check in with ADMITTING at 9:30AM   - Do not eat or drink anything after midnight the night prior to your procedure.   - Take all your morning medication (except diabetic medications) with a sip of water prior to arrival.  - Do NOT miss any doses of your blood thinner - if you should miss a dose or take a dose more than 4 hours late -- please notify our office immediately.  - You will not be able to drive home after your procedure. Please ensure you have a responsible adult to drive you home. You will need someone with you for 24 hours post procedure.     - Expect to be in the procedural area approximately 2 hours.   - If you feel as if you go back into normal rhythm prior to scheduled cardioversion, please notify our office immediately.   If your procedure is canceled in the cardioversion suite you will be charged a cancellation fee.

## 2023-10-30 NOTE — Progress Notes (Signed)
 Primary Care Physician: Dayna Motto, DO Primary Cardiologist: Vina Gull, MD Electrophysiologist: None     Referring Physician: Damien Braver, NP     [name] is a 78 y.o. female with a history of CAD s/p PCI/stenting-LAD in 1999 and 2002, chronic diastolic CHF, HTN, HLD, prediabetes, esophageal stricture, GERD, and atrial fibrillation who presents for consultation in the [facility]. Seen by Cardiology on 7/15 noted to be in atrial flutter with RVR. Cardiac monitor placed at office visit. Patient is on Eliquis  5 mg BID for a CHADS2VASC score of 6.  On evaluation today, she is currently in atrial flutter with RVR. Patient's husband noted ECG performed at PCP office several weeks ago also showed abnormal heart rhythm. No missed doses of Eliquis .   Today, she denies symptoms of , orthopnea, PND, lower extremity edema, dizziness, presyncope, syncope, snoring, daytime somnolence, bleeding, or neurologic sequela. The patient is tolerating medications without difficulties and is otherwise without complaint today.    she has a BMI of Body mass index is 33.62 kg/m.[name] Filed Weights   10/30/23 1338  Weight: 83.4 kg    Current Outpatient Medications  Medication Sig Dispense Refill   apixaban  (ELIQUIS ) 5 MG TABS tablet Take 1 tablet (5 mg total) by mouth 2 (two) times daily. 28 tablet 0   Aspirin  81 MG CAPS      atorvastatin  (LIPITOR) 20 MG tablet TAKE 1 TABLET BY MOUTH  DAILY AT 6 PM 90 tablet 2   Biotin 5 MG CAPS      dicyclomine  (BENTYL ) 20 MG tablet Take 1 tablet (20 mg total) by mouth 2 (two) times daily. 20 tablet 0   ELIQUIS  5 MG TABS tablet TAKE 1 TABLET BY MOUTH TWICE  DAILY 200 tablet 2   estradiol  (ESTRACE ) 0.5 MG tablet      fluocinonide cream (LIDEX) 0.05 % Apply 1 application. topically 2 (two) times daily.     furosemide  (LASIX ) 20 MG tablet Take 20 mg by mouth as needed.     GAVILYTE-C 240 g solution Take by mouth.      GLUCOSAMINE-CHONDROITIN DS PO Take 2,000 mg by mouth daily at 12 noon.     hydrocortisone 2.5 % cream SMARTSIG:sparingly Topical Twice Daily     isosorbide  mononitrate (IMDUR ) 30 MG 24 hr tablet Take 1 tablet (30 mg total) by mouth daily. 90 tablet 2   labetalol  (NORMODYNE ) 200 MG tablet Take 1 tablet (200 mg total) by mouth 2 (two) times daily. 180 tablet 3   lisinopril  (ZESTRIL ) 10 MG tablet Take 1 tablet (10 mg total) by mouth daily. 90 tablet 3   mometasone (ELOCON) 0.1 % cream Apply 1 application  topically daily.     nitroGLYCERIN  (NITROSTAT ) 0.4 MG SL tablet DISSOLVE 1 TABLET UNDER THE TONGUE EVERY 5 MINUTES AS  NEEDED FOR CHEST PAIN. MAX  OF 3 TABLETS IN 15 MINUTES. CALL 911 IF PAIN PERSISTS. 125 tablet 1   NON FORMULARY Pain cream for arthritis sent to [facility], faxed 11/01/2018 [id number]     ondansetron  (ZOFRAN -ODT) 4 MG disintegrating tablet Take 1 tablet (4 mg total) by mouth every 8 (eight) hours as needed for nausea or vomiting. 20 tablet 0   pantoprazole (PROTONIX) 40 MG tablet Take 40 mg by mouth daily.     scopolamine (TRANSDERM-SCOP) 1 MG/3DAYS 1 patch every 3 (three) days.     Soft Lens Products (REWETTING DROPS) SOLN Place 1 drop 3 (three) times daily as needed into  both eyes (for dry/irritated contact lenses).     spironolactone  (ALDACTONE ) 25 MG tablet Take 0.5 tablets (12.5 mg total) by mouth daily.     VITAMIN D  PO Take 2,000 mg by mouth daily.     Vitamin D , Ergocalciferol , (DRISDOL ) 1.25 MG (50000 UT) CAPS capsule TAKE 1 CAPSULE BY MOUTH WEEKLY, START VIT D 2,000 UNITS DAILY AFTER 60 DAYS     No current facility-administered medications for this encounter.    Atrial Fibrillation Management history:  Previous antiarrhythmic drugs: none Previous cardioversions: none Previous ablations: none Anticoagulation history: Eliquis    ROS- All systems are reviewed and negative except as per the HPI above.  Physical Exam: BP 110/80   Pulse (!) 122   Ht 5' 2 (1.575  m)   Wt 83.4 kg   BMI 33.62 kg/m   GEN: Well nourished, well developed in no acute distress. Zio monitor on chest. NECK: No JVD; No carotid bruits CARDIAC: Irregularly irregular tachycardic rate and rhythm, no murmurs, rubs, gallops RESPIRATORY:  Clear to auscultation without rales, wheezing or rhonchi  ABDOMEN: Soft, non-tender, non-distended EXTREMITIES:  No edema; No deformity   EKG today demonstrates  Vent. rate 122 BPM PR interval * ms QRS duration 88 ms QT/QTcB 326/464 ms P-R-T axes * 12 4 Atrial flutter with variable A-V block Abnormal ECG When compared with ECG of 27-Oct-2023 11:56, No significant change was found  Echo 04/17/21 demonstrated  1. Left ventricular ejection fraction, by estimation, is 60 to 65%. The  left ventricle has normal function. The left ventricle has no regional  wall motion abnormalities. Left ventricular diastolic parameters are  consistent with Grade I diastolic  dysfunction (impaired relaxation). Elevated left atrial pressure.   2. Right ventricular systolic function is normal. The right ventricular  size is normal.   3. Left atrial size was severely dilated.   4. Right atrial size was mildly dilated.   5. The mitral valve is normal in structure. Mild mitral valve  regurgitation. No evidence of mitral stenosis. Severe mitral annular  calcification.   6. The aortic valve is normal in structure. Aortic valve regurgitation is  not visualized. No aortic stenosis is present.   7. The inferior vena cava is normal in size with greater than 50%  respiratory variability, suggesting right atrial pressure of 3 mmHg.    ASSESSMENT & PLAN CHA2DS2-VASc Score = 6  The patient's score is based upon: CHF History: 1 HTN History: 1 Diabetes History: 0 Stroke History: 0 Vascular Disease History: 1 Age Score: 2 Gender Score: 1       ASSESSMENT AND PLAN: Paroxysmal Atrial Fibrillation (ICD10:  I48.19) The patient's CHA2DS2-VASc score is 6, indicating  a 9.7% annual risk of stroke.    She is currently in atrial flutter with RVR. We discussed the procedure cardioversion to try to convert to NSR. We discussed the risks vs benefits of this procedure and how ultimately we cannot predict whether a patient will have early return of arrhythmia post procedure. After discussion, the patient wishes to proceed with cardioversion. Labs drawn on 6/27. Continue labetalol  200 mg BID.  Informed Consent   Shared Decision Making/Informed Consent The risks (stroke, cardiac arrhythmias rarely resulting in the need for a temporary or permanent pacemaker, skin irritation or burns and complications associated with conscious sedation including aspiration, arrhythmia, respiratory failure and death), benefits (restoration of normal sinus rhythm) and alternatives of a direct current cardioversion were explained in detail to Ms. Mick and she agrees to proceed.  Secondary Hypercoagulable State (ICD10:  D68.69) The patient is at significant risk for stroke/thromboembolism based upon her CHA2DS2-VASc Score of 6.  Continue Apixaban  (Eliquis ).  No missed doses.      Follow up 2 weeks after DCCV.   Terra Pac, PA-C  Afib Clinic [facility] [address] [place], [place] [address] [phone]

## 2023-10-30 NOTE — Addendum Note (Signed)
 Encounter addended by: Franchot Glade RAMAN, RN on: 10/30/2023 2:56 PM  Actions taken: Visit diagnoses modified, Order list changed, Diagnosis association updated

## 2023-10-31 ENCOUNTER — Other Ambulatory Visit: Payer: Self-pay | Admitting: Interventional Cardiology

## 2023-10-31 ENCOUNTER — Other Ambulatory Visit: Payer: Self-pay | Admitting: Internal Medicine

## 2023-10-31 DIAGNOSIS — I5032 Chronic diastolic (congestive) heart failure: Secondary | ICD-10-CM

## 2023-10-31 DIAGNOSIS — I2510 Atherosclerotic heart disease of native coronary artery without angina pectoris: Secondary | ICD-10-CM

## 2023-11-02 ENCOUNTER — Encounter (HOSPITAL_COMMUNITY): Admission: RE | Payer: Self-pay | Source: Home / Self Care

## 2023-11-02 ENCOUNTER — Ambulatory Visit (HOSPITAL_COMMUNITY): Admission: RE | Admit: 2023-11-02 | Source: Home / Self Care | Admitting: Cardiovascular Disease

## 2023-11-02 ENCOUNTER — Telehealth: Payer: Self-pay | Admitting: Cardiovascular Disease

## 2023-11-02 DIAGNOSIS — I4819 Other persistent atrial fibrillation: Secondary | ICD-10-CM

## 2023-11-02 SURGERY — CARDIOVERSION (CATH LAB)
Anesthesia: Monitor Anesthesia Care

## 2023-11-02 NOTE — Telephone Encounter (Signed)
 Pt called and decided she wanted to get heart monitor report back before she proceeds with DCCV. Today procedure for DCCV cancelled per pt request and will reconsider on next appt after getting monitor report.

## 2023-11-02 NOTE — Telephone Encounter (Signed)
 Patient would like to cancel procedure with Dr. Delford. She says she was talked into doing it, but doesn't want to proceed at this time.

## 2023-11-02 NOTE — Telephone Encounter (Addendum)
 Pt calling in to cancel DCCV this morning, states this was all just told to her on Friday and she is not sure she would like to proceed with something so invasive.  Did discuss this briefly with pt, she said isn't there medicine we can start first.  Pt aware will forward to the afib clinic to follow up with her and advise as they just saw her Friday.  Pt understands they will follow up with her about this.  Called cath lab and cancelled DCCV

## 2023-11-03 ENCOUNTER — Other Ambulatory Visit: Payer: Self-pay

## 2023-11-03 MED ORDER — LISINOPRIL 10 MG PO TABS
10.0000 mg | ORAL_TABLET | Freq: Every day | ORAL | 3 refills | Status: AC
Start: 2023-11-03 — End: 2024-02-23

## 2023-11-11 DIAGNOSIS — I480 Paroxysmal atrial fibrillation: Secondary | ICD-10-CM | POA: Diagnosis not present

## 2023-11-13 DIAGNOSIS — I480 Paroxysmal atrial fibrillation: Secondary | ICD-10-CM

## 2023-11-15 ENCOUNTER — Ambulatory Visit: Payer: Self-pay | Admitting: Nurse Practitioner

## 2023-11-16 ENCOUNTER — Ambulatory Visit (HOSPITAL_COMMUNITY)
Admission: RE | Admit: 2023-11-16 | Discharge: 2023-11-16 | Disposition: A | Discharge status: Home / Self Care | Source: Ambulatory Visit | Attending: Internal Medicine | Admitting: Internal Medicine

## 2023-11-16 VITALS — BP 116/70 | HR 126 | Ht 62.0 in | Wt 187.8 lb

## 2023-11-16 DIAGNOSIS — I4892 Unspecified atrial flutter: Secondary | ICD-10-CM

## 2023-11-16 DIAGNOSIS — I4819 Other persistent atrial fibrillation: Secondary | ICD-10-CM

## 2023-11-16 DIAGNOSIS — D6869 Other thrombophilia: Secondary | ICD-10-CM | POA: Diagnosis not present

## 2023-11-16 DIAGNOSIS — I4891 Unspecified atrial fibrillation: Secondary | ICD-10-CM | POA: Diagnosis not present

## 2023-11-16 LAB — CBC
Hematocrit: 33.6 % — ABNORMAL LOW (ref 34.0–46.6)
Hemoglobin: 10.7 g/dL — ABNORMAL LOW (ref 11.1–15.9)
MCH: 31.3 pg (ref 26.6–33.0)
MCHC: 31.8 g/dL (ref 31.5–35.7)
MCV: 98 fL — ABNORMAL HIGH (ref 79–97)
Platelets: 204 x10E3/uL (ref 150–450)
RBC: 3.42 x10E6/uL — ABNORMAL LOW (ref 3.77–5.28)
RDW: 14.2 % (ref 11.7–15.4)
WBC: 6 x10E3/uL (ref 3.4–10.8)

## 2023-11-16 LAB — BASIC METABOLIC PANEL WITH GFR
BUN/Creatinine Ratio: 15 (ref 12–28)
BUN: 15 mg/dL (ref 8–27)
CO2: 22 mmol/L (ref 20–29)
Calcium: 8.9 mg/dL (ref 8.7–10.3)
Chloride: 108 mmol/L — ABNORMAL HIGH (ref 96–106)
Creatinine, Ser: 0.98 mg/dL (ref 0.57–1.00)
Glucose: 111 mg/dL — ABNORMAL HIGH (ref 70–99)
Potassium: 4 mmol/L (ref 3.5–5.2)
Sodium: 144 mmol/L (ref 134–144)
eGFR: 59 mL/min/1.73 — ABNORMAL LOW (ref >59)

## 2023-11-16 NOTE — Patient Instructions (Signed)
 Cardioversion scheduled for: 11/18/23 Wednesday 12:00pm   - Arrive at the [facility] ([address]  and check in with ADMITTING at 12:00pm   - Do not eat or drink anything after midnight the night prior to your procedure.   - Take all your morning medication (except diabetic medications) with a sip of water prior to arrival.  - Do NOT miss any doses of your blood thinner - if you should miss a dose or take a dose more than 4 hours late -- please notify our office immediately.  - You will not be able to drive home after your procedure. Please ensure you have a responsible adult to drive you home. You will need someone with you for 24 hours post procedure.     - Expect to be in the procedural area approximately 2 hours.   - If you feel as if you go back into normal rhythm prior to scheduled cardioversion, please notify our office immediately.   If your procedure is canceled in the cardioversion suite you will be charged a cancellation fee.

## 2023-11-16 NOTE — Addendum Note (Signed)
 Encounter addended by: Janel Nancy SAUNDERS, RN on: 11/16/2023 3:52 PM  Actions taken: Visit diagnoses modified, Clinical Note Signed, Order list changed, Diagnosis association updated

## 2023-11-16 NOTE — H&P (View-Only) (Signed)
 Primary Care Physician: Dayna Motto, DO Primary Cardiologist: Vina Gull, MD Electrophysiologist: None     Referring Physician: Damien Braver, NP     [name] is a 78 y.o. female with a history of CAD s/p PCI/stenting-LAD in 1999 and 2002, chronic diastolic CHF, HTN, HLD, prediabetes, esophageal stricture, GERD, and atrial fibrillation who presents for consultation in the [facility]. Seen by Cardiology on 7/15 noted to be in atrial flutter with RVR. Cardiac monitor placed at office visit. Patient is on Eliquis  5 mg BID for a CHADS2VASC score of 6.  On evaluation today, she is currently in atrial flutter with RVR. Patient's husband noted ECG performed at PCP office several weeks ago also showed abnormal heart rhythm. No missed doses of Eliquis .   On follow up 11/16/23, patient is currently in atrial flutter with RVR. Patient was scheduled for DCCV after last visit but canceled procedure. She admittedly was scared to do procedure and then subsequently was told by friends how it helped them get into normal rhythm / feel better. Heart monitor placed by Cardiology on 7/15 resulted 8/1 showing 100% Afib burden. No missed doses of Eliquis .   Today, she denies symptoms of , orthopnea, PND, lower extremity edema, dizziness, presyncope, syncope, snoring, daytime somnolence, bleeding, or neurologic sequela. The patient is tolerating medications without difficulties and is otherwise without complaint today.   she has a BMI of Body mass index is 34.35 kg/m.[name] Filed Weights   11/16/23 1457  Weight: 85.2 kg     Current Outpatient Medications  Medication Sig Dispense Refill   acetaminophen (TYLENOL) 650 MG CR tablet Taking 3 capsules by mouth two times daily     apixaban  (ELIQUIS ) 5 MG TABS tablet Take 1 tablet (5 mg total) by mouth 2 (two) times daily. 28 tablet 0   atorvastatin  (LIPITOR) 20 MG tablet TAKE 1 TABLET BY MOUTH DAILY AT  6 PM (Patient taking  differently: Take 10 mg by mouth daily.) 100 tablet 1   Biotin 5 MG CAPS  (Patient taking differently: Taking 5000 units by mouth daily)     Cholecalciferol (VITAMIN D3) 125 MCG (5000 UT) CAPS Take 1 capsule by mouth 2 (two) times daily.     ELIQUIS  5 MG TABS tablet TAKE 1 TABLET BY MOUTH TWICE  DAILY 200 tablet 2   hydrocortisone 2.5 % cream SMARTSIG:sparingly Topical Twice Daily     isosorbide  mononitrate (IMDUR ) 30 MG 24 hr tablet TAKE 1 TABLET BY MOUTH DAILY 100 tablet 1   labetalol  (NORMODYNE ) 200 MG tablet Take 1 tablet (200 mg total) by mouth 2 (two) times daily. 180 tablet 3   lisinopril  (ZESTRIL ) 10 MG tablet Take 1 tablet (10 mg total) by mouth daily. 90 tablet 3   nitroGLYCERIN  (NITROSTAT ) 0.4 MG SL tablet DISSOLVE 1 TABLET UNDER THE  TONGUE EVERY 5 MINUTES AS NEEDED FOR CHEST PAIN. MAX OF 3 TABLETS IN 15 MINUTES. CALL 911 IF PAIN  PERSISTS. 75 tablet 3   Soft Lens Products (REWETTING DROPS) SOLN Place 1 drop 3 (three) times daily as needed into both eyes (for dry/irritated contact lenses).     spironolactone  (ALDACTONE ) 25 MG tablet Take 0.5 tablets (12.5 mg total) by mouth daily. 50 tablet 1   No current facility-administered medications for this encounter.    Atrial Fibrillation Management history:  Previous antiarrhythmic drugs: none Previous cardioversions: none Previous ablations: none Anticoagulation history: Eliquis    ROS- All systems are reviewed and negative except as per  the HPI above.  Physical Exam: BP 116/70   Pulse (!) 126   Ht 5' 2 (1.575 m)   Wt 85.2 kg   BMI 34.35 kg/m   GEN- The patient is well appearing, alert and oriented x 3 today.   Neck - no JVD or carotid bruit noted Lungs- Clear to ausculation bilaterally, normal work of breathing Heart- Irregular tachycardic rate and rhythm, no murmurs, rubs or gallops, PMI not laterally displaced Extremities- no clubbing, cyanosis, or edema Skin - no rash or ecchymosis noted   EKG today demonstrates   Vent. rate 126 BPM PR interval * ms QRS duration 82 ms QT/QTcB 332/480 ms P-R-T axes * 50 32 Atrial flutter with variable A-V block Abnormal ECG When compared with ECG of 30-Oct-2023 13:41, Atrial flutter has replaced Atrial fibrillation  Echo 04/17/21 demonstrated  1. Left ventricular ejection fraction, by estimation, is 60 to 65%. The  left ventricle has normal function. The left ventricle has no regional  wall motion abnormalities. Left ventricular diastolic parameters are  consistent with Grade I diastolic  dysfunction (impaired relaxation). Elevated left atrial pressure.   2. Right ventricular systolic function is normal. The right ventricular  size is normal.   3. Left atrial size was severely dilated.   4. Right atrial size was mildly dilated.   5. The mitral valve is normal in structure. Mild mitral valve  regurgitation. No evidence of mitral stenosis. Severe mitral annular  calcification.   6. The aortic valve is normal in structure. Aortic valve regurgitation is  not visualized. No aortic stenosis is present.   7. The inferior vena cava is normal in size with greater than 50%  respiratory variability, suggesting right atrial pressure of 3 mmHg.    ASSESSMENT & PLAN CHA2DS2-VASc Score = 6  The patient's score is based upon: CHF History: 1 HTN History: 1 Diabetes History: 0 Stroke History: 0 Vascular Disease History: 1 Age Score: 2 Gender Score: 1       ASSESSMENT AND PLAN: Persistent Atrial Fibrillation (ICD10:  I48.19) The patient's CHA2DS2-VASc score is 6, indicating a 9.7% annual risk of stroke.    Patient is currently in atrial flutter with RVR. She would like to now officially proceed with cardioversion.  We discussed the procedure cardioversion to try to convert to NSR. We discussed the risks vs benefits of this procedure and how ultimately we cannot predict whether a patient will have early return of arrhythmia post procedure. After discussion, the  patient wishes to proceed with cardioversion. Labs drawn today.   Informed Consent   Shared Decision Making/Informed Consent The risks (stroke, cardiac arrhythmias rarely resulting in the need for a temporary or permanent pacemaker, skin irritation or burns and complications associated with conscious sedation including aspiration, arrhythmia, respiratory failure and death), benefits (restoration of normal sinus rhythm) and alternatives of a direct current cardioversion were explained in detail to [name] and she agrees to proceed.      Secondary Hypercoagulable State (ICD10:  D68.69) The patient is at significant risk for stroke/thromboembolism based upon her CHA2DS2-VASc Score of 6.  Continue Apixaban  (Eliquis ).  No missed doses.    Follow up 2 weeks after DCCV.   Terra Pac, PA-C  Afib Clinic [facility] [address] [place], [place] [address] [phone]

## 2023-11-16 NOTE — Progress Notes (Addendum)
 Primary Care Physician: Dayna Motto, DO Primary Cardiologist: Vina Gull, MD Electrophysiologist: None     Referring Physician: Damien Braver, NP     [name] is a 78 y.o. female with a history of CAD s/p PCI/stenting-LAD in 1999 and 2002, chronic diastolic CHF, HTN, HLD, prediabetes, esophageal stricture, GERD, and atrial fibrillation who presents for consultation in the [facility]. Seen by Cardiology on 7/15 noted to be in atrial flutter with RVR. Cardiac monitor placed at office visit. Patient is on Eliquis  5 mg BID for a CHADS2VASC score of 6.  On evaluation today, she is currently in atrial flutter with RVR. Patient's husband noted ECG performed at PCP office several weeks ago also showed abnormal heart rhythm. No missed doses of Eliquis .   On follow up 11/16/23, patient is currently in atrial flutter with RVR. Patient was scheduled for DCCV after last visit but canceled procedure. She admittedly was scared to do procedure and then subsequently was told by friends how it helped them get into normal rhythm / feel better. Heart monitor placed by Cardiology on 7/15 resulted 8/1 showing 100% Afib burden. No missed doses of Eliquis .   Today, she denies symptoms of , orthopnea, PND, lower extremity edema, dizziness, presyncope, syncope, snoring, daytime somnolence, bleeding, or neurologic sequela. The patient is tolerating medications without difficulties and is otherwise without complaint today.   she has a BMI of Body mass index is 34.35 kg/m.[name] Filed Weights   11/16/23 1457  Weight: 85.2 kg     Current Outpatient Medications  Medication Sig Dispense Refill   acetaminophen (TYLENOL) 650 MG CR tablet Taking 3 capsules by mouth two times daily     apixaban  (ELIQUIS ) 5 MG TABS tablet Take 1 tablet (5 mg total) by mouth 2 (two) times daily. 28 tablet 0   atorvastatin  (LIPITOR) 20 MG tablet TAKE 1 TABLET BY MOUTH DAILY AT  6 PM (Patient taking  differently: Take 10 mg by mouth daily.) 100 tablet 1   Biotin 5 MG CAPS  (Patient taking differently: Taking 5000 units by mouth daily)     Cholecalciferol (VITAMIN D3) 125 MCG (5000 UT) CAPS Take 1 capsule by mouth 2 (two) times daily.     ELIQUIS  5 MG TABS tablet TAKE 1 TABLET BY MOUTH TWICE  DAILY 200 tablet 2   hydrocortisone 2.5 % cream SMARTSIG:sparingly Topical Twice Daily     isosorbide  mononitrate (IMDUR ) 30 MG 24 hr tablet TAKE 1 TABLET BY MOUTH DAILY 100 tablet 1   labetalol  (NORMODYNE ) 200 MG tablet Take 1 tablet (200 mg total) by mouth 2 (two) times daily. 180 tablet 3   lisinopril  (ZESTRIL ) 10 MG tablet Take 1 tablet (10 mg total) by mouth daily. 90 tablet 3   nitroGLYCERIN  (NITROSTAT ) 0.4 MG SL tablet DISSOLVE 1 TABLET UNDER THE  TONGUE EVERY 5 MINUTES AS NEEDED FOR CHEST PAIN. MAX OF 3 TABLETS IN 15 MINUTES. CALL 911 IF PAIN  PERSISTS. 75 tablet 3   Soft Lens Products (REWETTING DROPS) SOLN Place 1 drop 3 (three) times daily as needed into both eyes (for dry/irritated contact lenses).     spironolactone  (ALDACTONE ) 25 MG tablet Take 0.5 tablets (12.5 mg total) by mouth daily. 50 tablet 1   No current facility-administered medications for this encounter.    Atrial Fibrillation Management history:  Previous antiarrhythmic drugs: none Previous cardioversions: none Previous ablations: none Anticoagulation history: Eliquis    ROS- All systems are reviewed and negative except as per  the HPI above.  Physical Exam: BP 116/70   Pulse (!) 126   Ht 5' 2 (1.575 m)   Wt 85.2 kg   BMI 34.35 kg/m   GEN- The patient is well appearing, alert and oriented x 3 today.   Neck - no JVD or carotid bruit noted Lungs- Clear to ausculation bilaterally, normal work of breathing Heart- Irregular tachycardic rate and rhythm, no murmurs, rubs or gallops, PMI not laterally displaced Extremities- no clubbing, cyanosis, or edema Skin - no rash or ecchymosis noted   EKG today demonstrates   Vent. rate 126 BPM PR interval * ms QRS duration 82 ms QT/QTcB 332/480 ms P-R-T axes * 50 32 Atrial flutter with variable A-V block Abnormal ECG When compared with ECG of 30-Oct-2023 13:41, Atrial flutter has replaced Atrial fibrillation  Echo 04/17/21 demonstrated  1. Left ventricular ejection fraction, by estimation, is 60 to 65%. The  left ventricle has normal function. The left ventricle has no regional  wall motion abnormalities. Left ventricular diastolic parameters are  consistent with Grade I diastolic  dysfunction (impaired relaxation). Elevated left atrial pressure.   2. Right ventricular systolic function is normal. The right ventricular  size is normal.   3. Left atrial size was severely dilated.   4. Right atrial size was mildly dilated.   5. The mitral valve is normal in structure. Mild mitral valve  regurgitation. No evidence of mitral stenosis. Severe mitral annular  calcification.   6. The aortic valve is normal in structure. Aortic valve regurgitation is  not visualized. No aortic stenosis is present.   7. The inferior vena cava is normal in size with greater than 50%  respiratory variability, suggesting right atrial pressure of 3 mmHg.    ASSESSMENT & PLAN CHA2DS2-VASc Score = 6  The patient's score is based upon: CHF History: 1 HTN History: 1 Diabetes History: 0 Stroke History: 0 Vascular Disease History: 1 Age Score: 2 Gender Score: 1       ASSESSMENT AND PLAN: Persistent Atrial Fibrillation (ICD10:  I48.19) The patient's CHA2DS2-VASc score is 6, indicating a 9.7% annual risk of stroke.    Patient is currently in atrial flutter with RVR. She would like to now officially proceed with cardioversion.  We discussed the procedure cardioversion to try to convert to NSR. We discussed the risks vs benefits of this procedure and how ultimately we cannot predict whether a patient will have early return of arrhythmia post procedure. After discussion, the  patient wishes to proceed with cardioversion. Labs drawn today.   Informed Consent   Shared Decision Making/Informed Consent The risks (stroke, cardiac arrhythmias rarely resulting in the need for a temporary or permanent pacemaker, skin irritation or burns and complications associated with conscious sedation including aspiration, arrhythmia, respiratory failure and death), benefits (restoration of normal sinus rhythm) and alternatives of a direct current cardioversion were explained in detail to Ms. Ranta and she agrees to proceed.      Secondary Hypercoagulable State (ICD10:  D68.69) The patient is at significant risk for stroke/thromboembolism based upon her CHA2DS2-VASc Score of 6.  Continue Apixaban  (Eliquis ).  No missed doses.    Follow up 2 weeks after DCCV.   Terra Pac, PA-C  Afib Clinic [facility] [address] [place], [place] [address] [phone]

## 2023-11-17 ENCOUNTER — Ambulatory Visit (HOSPITAL_COMMUNITY): Payer: Self-pay | Admitting: Internal Medicine

## 2023-11-17 ENCOUNTER — Other Ambulatory Visit (HOSPITAL_COMMUNITY): Payer: Self-pay

## 2023-11-17 MED ORDER — APIXABAN 5 MG PO TABS: 5.0000 mg | ORAL_TABLET | Freq: Two times a day (BID) | ORAL | Status: DC

## 2023-11-17 NOTE — Progress Notes (Signed)
 Left VM to return call for instructions for appointment on 11/18/2023.

## 2023-11-17 NOTE — Progress Notes (Signed)
 Pt called for pre procedure instructions.  Pt returned phone call. Arrival time 1200 NPO after midnight explained Instructed to take am meds with sip of water and confirmed blood thinner consistency, Eliquis . Instructed pt need for ride home tomorrow and have responsible adult with them for 24 hrs post procedure.

## 2023-11-18 ENCOUNTER — Other Ambulatory Visit: Payer: Self-pay

## 2023-11-18 ENCOUNTER — Ambulatory Visit (HOSPITAL_COMMUNITY)
Admission: RE | Admit: 2023-11-18 | Discharge: 2023-11-18 | Disposition: A | Discharge status: Home / Self Care | Source: Ambulatory Visit | Attending: Cardiovascular Disease | Admitting: Cardiovascular Disease

## 2023-11-18 ENCOUNTER — Encounter (HOSPITAL_COMMUNITY): Payer: Self-pay | Admitting: Cardiovascular Disease

## 2023-11-18 ENCOUNTER — Ambulatory Visit (HOSPITAL_COMMUNITY): Admitting: Anesthesiology

## 2023-11-18 ENCOUNTER — Encounter (HOSPITAL_COMMUNITY)
Admission: RE | Disposition: A | Discharge status: Home / Self Care | Payer: Self-pay | Source: Ambulatory Visit | Attending: Cardiovascular Disease

## 2023-11-18 DIAGNOSIS — I2510 Atherosclerotic heart disease of native coronary artery without angina pectoris: Secondary | ICD-10-CM | POA: Diagnosis not present

## 2023-11-18 DIAGNOSIS — Z7901 Long term (current) use of anticoagulants: Secondary | ICD-10-CM | POA: Insufficient documentation

## 2023-11-18 DIAGNOSIS — I4891 Unspecified atrial fibrillation: Secondary | ICD-10-CM

## 2023-11-18 DIAGNOSIS — I252 Old myocardial infarction: Secondary | ICD-10-CM | POA: Insufficient documentation

## 2023-11-18 DIAGNOSIS — I509 Heart failure, unspecified: Secondary | ICD-10-CM | POA: Insufficient documentation

## 2023-11-18 DIAGNOSIS — I4819 Other persistent atrial fibrillation: Secondary | ICD-10-CM | POA: Diagnosis not present

## 2023-11-18 DIAGNOSIS — I110 Hypertensive heart disease with heart failure: Secondary | ICD-10-CM | POA: Diagnosis not present

## 2023-11-18 DIAGNOSIS — I5032 Chronic diastolic (congestive) heart failure: Secondary | ICD-10-CM

## 2023-11-18 DIAGNOSIS — D6869 Other thrombophilia: Secondary | ICD-10-CM | POA: Diagnosis not present

## 2023-11-18 DIAGNOSIS — G4730 Sleep apnea, unspecified: Secondary | ICD-10-CM | POA: Insufficient documentation

## 2023-11-18 DIAGNOSIS — Z955 Presence of coronary angioplasty implant and graft: Secondary | ICD-10-CM | POA: Insufficient documentation

## 2023-11-18 DIAGNOSIS — I4892 Unspecified atrial flutter: Secondary | ICD-10-CM | POA: Insufficient documentation

## 2023-11-18 HISTORY — PX: CARDIOVERSION: EP1203

## 2023-11-18 SURGERY — CARDIOVERSION (CATH LAB)
Anesthesia: General

## 2023-11-18 MED ORDER — SODIUM CHLORIDE 0.9% FLUSH
INTRAVENOUS | Status: DC | PRN
  Administered 2023-11-18 (×2): 5 mL via INTRAVENOUS

## 2023-11-18 MED ORDER — LIDOCAINE 2% (20 MG/ML) 5 ML SYRINGE
INTRAMUSCULAR | Status: DC | PRN
  Administered 2023-11-18: 80 mg via INTRAVENOUS

## 2023-11-18 MED ORDER — PHENYLEPHRINE HCL (PRESSORS) 10 MG/ML IV SOLN
INTRAVENOUS | Status: DC | PRN
Start: 2023-11-18 — End: 2023-11-18
  Administered 2023-11-18 (×2): 80 ug via INTRAVENOUS

## 2023-11-18 MED ORDER — PROPOFOL 10 MG/ML IV BOLUS
INTRAVENOUS | Status: DC | PRN
  Administered 2023-11-18: 50 mg via INTRAVENOUS

## 2023-11-18 MED ORDER — SODIUM CHLORIDE 0.9 % IV SOLN: INTRAVENOUS | Status: DC

## 2023-11-18 SURGICAL SUPPLY — 1 items: PAD DEFIB RADIO PHYSIO CONN (PAD) ×1 IMPLANT

## 2023-11-18 NOTE — Anesthesia Postprocedure Evaluation (Signed)
 Anesthesia Post Note  Patient: [name]  Procedure(s) Performed: CARDIOVERSION     Patient location during evaluation: Cath Lab Anesthesia Type: General Level of consciousness: awake and alert Pain management: pain level controlled Vital Signs Assessment: post-procedure vital signs reviewed and stable Respiratory status: spontaneous breathing, nonlabored ventilation, respiratory function stable and patient connected to nasal cannula oxygen Cardiovascular status: blood pressure returned to baseline and stable Postop Assessment: no apparent nausea or vomiting Anesthetic complications: no   No notable events documented.  Last Vitals:  Vitals:   11/18/23 1400 11/18/23 1405  BP: 114/61 104/63  Pulse: 74 76  Resp: (!) 21 20  Temp:    SpO2: 96% 97%    Last Pain:  Vitals:   11/18/23 1346  TempSrc:   PainSc: 0-No pain   Pain Goal:                   [place] Latulippe L Euline Kimbler

## 2023-11-18 NOTE — Interval H&P Note (Signed)
 History and Physical Interval Note:  11/18/2023 12:24 PM  [name]  has presented today for surgery, with the diagnosis of AFIB.  The various methods of treatment have been discussed with the patient and family. After consideration of risks, benefits and other options for treatment, the patient has consented to  Procedure(s): CARDIOVERSION (N/A) as a surgical intervention.  The patient's history has been reviewed, patient examined, no change in status, stable for surgery.  I have reviewed the patient's chart and labs.  Questions were answered to the patient's satisfaction.    NPO for DCCV. On eliquis  >3 weeks.   Signed, Darryle DASEN. Barbaraann, MD, [facility]  [facility]  [address] [place], [place] [address] [phone]  12:24 PM

## 2023-11-18 NOTE — Transfer of Care (Signed)
 Immediate Anesthesia Transfer of Care Note  Patient: [name]  Procedure(s) Performed: CARDIOVERSION  Patient Location: Cath Lab  Anesthesia Type:General  Level of Consciousness: awake, drowsy, patient cooperative, and responds to stimulation  Airway & Oxygen Therapy: Patient connected to face mask oxygen  Post-op Assessment: Report given to RN, Post -op Vital signs reviewed and stable, and Patient moving all extremities X 4  Post vital signs: Reviewed and stable  Last Vitals:  Vitals Value Taken Time  BP 101/66   Temp    Pulse 74   Resp 17   SpO2 98     Last Pain:  Vitals:   11/18/23 1147  TempSrc:   PainSc: 0-No pain         Complications: No notable events documented.

## 2023-11-18 NOTE — Anesthesia Preprocedure Evaluation (Addendum)
 Anesthesia Evaluation  Patient identified by MRN, date of birth, ID band Patient awake    Reviewed: Allergy & Precautions, NPO status , Patient's Chart, lab work & pertinent test results, reviewed documented beta blocker date and time   Airway Mallampati: III  TM Distance: >3 FB Neck ROM: Full    Dental no notable dental hx. (+) Teeth Intact, Dental Advisory Given   Pulmonary sleep apnea    Pulmonary exam normal breath sounds clear to auscultation       Cardiovascular hypertension, Pt. on medications and Pt. on home beta blockers + CAD, + Past MI and + Cardiac Stents  + dysrhythmias (eliquis ) Atrial Fibrillation  Rhythm:Irregular Rate:Normal  TTE 2023 1. Left ventricular ejection fraction, by estimation, is 60 to 65%. The  left ventricle has normal function. The left ventricle has no regional  wall motion abnormalities. Left ventricular diastolic parameters are  consistent with Grade I diastolic  dysfunction (impaired relaxation). Elevated left atrial pressure.   2. Right ventricular systolic function is normal. The right ventricular  size is normal.   3. Left atrial size was severely dilated.   4. Right atrial size was mildly dilated.   5. The mitral valve is normal in structure. Mild mitral valve  regurgitation. No evidence of mitral stenosis. Severe mitral annular  calcification.   6. The aortic valve is normal in structure. Aortic valve regurgitation is  not visualized. No aortic stenosis is present.   7. The inferior vena cava is normal in size with greater than 50%  respiratory variability, suggesting right atrial pressure of 3 mmHg.   Stress Test 2022 Normal perfusion. LVEF 71% with normal wall motion. This is a low risk study. No change compared to prior study in 2013.    Neuro/Psych  PSYCHIATRIC DISORDERS Anxiety Depression    negative neurological ROS     GI/Hepatic Neg liver ROS,GERD  ,,  Endo/Other  negative  endocrine ROS    Renal/GU Renal InsufficiencyRenal disease  negative genitourinary   Musculoskeletal negative musculoskeletal ROS (+)    Abdominal   Peds  Hematology negative hematology ROS (+)   Anesthesia Other Findings   Reproductive/Obstetrics                              Anesthesia Physical Anesthesia Plan  ASA: 3  Anesthesia Plan: General   Post-op Pain Management:    Induction: Intravenous  PONV Risk Score and Plan: Propofol  infusion and Treatment may vary due to age or medical condition  Airway Management Planned: Natural Airway  Additional Equipment:   Intra-op Plan:   Post-operative Plan:   Informed Consent: I have reviewed the patients History and Physical, chart, labs and discussed the procedure including the risks, benefits and alternatives for the proposed anesthesia with the patient or authorized representative who has indicated his/her understanding and acceptance.     Dental advisory given  Plan Discussed with: CRNA  Anesthesia Plan Comments:          Anesthesia Quick Evaluation

## 2023-11-18 NOTE — CV Procedure (Signed)
   DIRECT CURRENT CARDIOVERSION  NAME:  [name]    MRN: [id number] DOB:  [birth year]    ADMIT DATE: 11/18/2023  Indication:  Symptomatic atrial fibrillation  Procedure Note:  The patient signed informed consent.  They have had had therapeutic anticoagulation with eliquis  greater than 3 weeks.  Anesthesia was administered by Dr. Niels.  Adequate airway was maintained throughout and vital followed per protocol.  They were cardioverted x 1 with 200J of biphasic synchronized energy.  They converted to NSR.  There were no apparent complications.  The patient had normal neuro status and respiratory status post procedure with vitals stable as recorded elsewhere.    Follow up: They will continue on current medical therapy and follow up with cardiology as scheduled.  Darryle T. Barbaraann, MD, [facility]  [facility]  [address] [place], [place] [address] [phone]  1:11 PM

## 2023-11-20 DIAGNOSIS — I480 Paroxysmal atrial fibrillation: Secondary | ICD-10-CM | POA: Diagnosis not present

## 2023-11-20 DIAGNOSIS — D649 Anemia, unspecified: Secondary | ICD-10-CM | POA: Diagnosis not present

## 2023-11-23 ENCOUNTER — Telehealth: Payer: Self-pay | Admitting: Internal Medicine

## 2023-11-23 MED ORDER — DILTIAZEM HCL 30 MG PO TABS: ORAL_TABLET | ORAL | 1 refills | Status: AC

## 2023-11-23 NOTE — Telephone Encounter (Signed)
 Discussed with Daril Kicks PA -- pt hesitant to make any medication changes at this point; recommends pt try PRN cardizem  30mg  to use for elevated rates until office visit. Pt denies symptoms such as shortness of breath/chest pain etc.

## 2023-11-23 NOTE — Telephone Encounter (Signed)
 Hr 87-156 since Friday  Mostly hr is around 110-115. She denies sob or dizziness. Has an appt with Afib clinic 11/30/23- she plans to call the afib clinic after we get off the phone.  Informed her that I would send this to Afib clinic and to her provider, but she can go ahead and call them to see if she needs to be seen before her already scheduled appt.   She verbalized understanding.

## 2023-11-23 NOTE — Telephone Encounter (Signed)
 Patient c/o Palpitations:  STAT if patient reporting lightheadedness, shortness of breath, or chest pain  How long have you had palpitations/irregular HR/ Afib? Are you having the symptoms now?   Yes  Are you currently experiencing lightheadedness, SOB or CP?   No  Do you have a history of afib (atrial fibrillation) or irregular heart rhythm?   Yes  Have you checked your BP or HR? (document readings if available):  HR 112  Are you experiencing any other symptoms?   Hot sweats  Patient stated she had a cardioversion last Wednesday and has been having Afib symptoms since last Friday.

## 2023-11-30 ENCOUNTER — Encounter (HOSPITAL_COMMUNITY): Payer: Self-pay | Admitting: Physician Assistant

## 2023-11-30 ENCOUNTER — Ambulatory Visit (HOSPITAL_COMMUNITY)
Admission: RE | Admit: 2023-11-30 | Discharge: 2023-11-30 | Disposition: A | Discharge status: Home / Self Care | Source: Ambulatory Visit | Attending: Physician Assistant | Admitting: Physician Assistant

## 2023-11-30 VITALS — BP 116/88 | HR 104 | Ht 62.0 in | Wt 188.2 lb

## 2023-11-30 DIAGNOSIS — I484 Atypical atrial flutter: Secondary | ICD-10-CM | POA: Diagnosis not present

## 2023-11-30 DIAGNOSIS — I4819 Other persistent atrial fibrillation: Secondary | ICD-10-CM | POA: Diagnosis not present

## 2023-11-30 DIAGNOSIS — D649 Anemia, unspecified: Secondary | ICD-10-CM | POA: Diagnosis not present

## 2023-11-30 DIAGNOSIS — D6869 Other thrombophilia: Secondary | ICD-10-CM

## 2023-11-30 NOTE — Progress Notes (Signed)
 Primary Care Physician: Dayna Motto, DO Primary Cardiologist: Vina Gull, MD Electrophysiologist: None  Referring Physician: Damien Braver, NP     [name] is a 78 y.o. female with a history of CAD s/p PCI/stenting-LAD in 1999 and 2002, chronic diastolic CHF, HTN, HLD, prediabetes, esophageal stricture, GERD, and atrial fibrillation who presents for follow up in the [facility]. Seen by Cardiology on 7/15 noted to be in atrial flutter with RVR. Cardiac monitor placed at office visit. Patient is on Eliquis  for stroke prevention.   On evaluation today, she is currently in atrial flutter with RVR. Patient's husband noted ECG performed at PCP office several weeks ago also showed abnormal heart rhythm. No missed doses of Eliquis .   On follow up 11/16/23, patient is currently in atrial flutter with RVR. Patient was scheduled for DCCV after last visit but canceled procedure. She admittedly was scared to do procedure and then subsequently was told by friends how it helped them get into normal rhythm / feel better. Heart monitor placed by Cardiology on 7/15 resulted 8/1 showing 100% Afib burden. No missed doses of Eliquis .   Follow up 11/30/23. Patient returns for follow up for atrial fibrillation. Patient is s/p DCCV 11/18/23. Unfortunately, she has had quick return of afib. She was in SR only for two days. Of note, her Hgb had dropped from 15.7 to 10.7. She had labs redrawn by her PCP earlier today. No overt bleeding.   Today, she  denies symptoms of palpitations, chest pain, shortness of breath, orthopnea, PND, lower extremity edema, dizziness, presyncope, syncope, snoring, daytime somnolence, bleeding, or neurologic sequela. The patient is tolerating medications without difficulties and is otherwise without complaint today.    she has a BMI of Body mass index is 34.42 kg/m.[name] Filed Weights   11/30/23 1439  Weight: 85.4 kg    Current Outpatient Medications   Medication Sig Dispense Refill   acetaminophen (TYLENOL) 650 MG CR tablet Taking 3 capsules by mouth two times daily     apixaban  (ELIQUIS ) 5 MG TABS tablet Take 1 tablet (5 mg total) by mouth 2 (two) times daily.     atorvastatin  (LIPITOR) 20 MG tablet TAKE 1 TABLET BY MOUTH DAILY AT  6 PM (Patient taking differently: Take 10 mg by mouth daily.) 100 tablet 1   Biotin 5 MG CAPS  (Patient taking differently: Taking 5000 units by mouth daily)     Cholecalciferol (VITAMIN D3) 125 MCG (5000 UT) CAPS Take 1 capsule by mouth 2 (two) times daily.     diltiazem  (CARDIZEM ) 30 MG tablet Take 1 tablet every 4 hours AS NEEDED for AFIB heart rate >110 as long as top BP >100. 45 tablet 1   ELIQUIS  5 MG TABS tablet TAKE 1 TABLET BY MOUTH TWICE  DAILY 200 tablet 2   hydrocortisone 2.5 % cream SMARTSIG:sparingly Topical Twice Daily     isosorbide  mononitrate (IMDUR ) 30 MG 24 hr tablet TAKE 1 TABLET BY MOUTH DAILY 100 tablet 1   labetalol  (NORMODYNE ) 200 MG tablet Take 1 tablet (200 mg total) by mouth 2 (two) times daily. 180 tablet 3   lisinopril  (ZESTRIL ) 10 MG tablet Take 1 tablet (10 mg total) by mouth daily. 90 tablet 3   nitroGLYCERIN  (NITROSTAT ) 0.4 MG SL tablet DISSOLVE 1 TABLET UNDER THE  TONGUE EVERY 5 MINUTES AS NEEDED FOR CHEST PAIN. MAX OF 3 TABLETS IN 15 MINUTES. CALL 911 IF PAIN  PERSISTS. 75 tablet 3   Soft Lens  Products (REWETTING DROPS) SOLN Place 1 drop 3 (three) times daily as needed into both eyes (for dry/irritated contact lenses).     spironolactone  (ALDACTONE ) 25 MG tablet Take 0.5 tablets (12.5 mg total) by mouth daily. 50 tablet 1   No current facility-administered medications for this encounter.    Atrial Fibrillation Management history:  Previous antiarrhythmic drugs: none Previous cardioversions: 11/18/23 Previous ablations: none Anticoagulation history: Eliquis    ROS- All systems are reviewed and negative except as per the HPI above.  Physical Exam: BP 116/88   Pulse (!)  104   Ht 5' 2 (1.575 m)   Wt 85.4 kg   BMI 34.42 kg/m    GEN: Well nourished, well developed in no acute distress CARDIAC: Irregularly irregular rate and rhythm, no murmurs, rubs, gallops RESPIRATORY:  Clear to auscultation without rales, wheezing or rhonchi  ABDOMEN: Soft, non-tender, non-distended EXTREMITIES:  No edema; No deformity    EKG today demonstrates  Afib Vent. rate 104 BPM PR interval * ms QRS duration 76 ms QT/QTcB 340/447 ms   Echo 04/17/21 demonstrated  1. Left ventricular ejection fraction, by estimation, is 60 to 65%. The  left ventricle has normal function. The left ventricle has no regional  wall motion abnormalities. Left ventricular diastolic parameters are  consistent with Grade I diastolic  dysfunction (impaired relaxation). Elevated left atrial pressure.   2. Right ventricular systolic function is normal. The right ventricular  size is normal.   3. Left atrial size was severely dilated.   4. Right atrial size was mildly dilated.   5. The mitral valve is normal in structure. Mild mitral valve  regurgitation. No evidence of mitral stenosis. Severe mitral annular  calcification.   6. The aortic valve is normal in structure. Aortic valve regurgitation is  not visualized. No aortic stenosis is present.   7. The inferior vena cava is normal in size with greater than 50%  respiratory variability, suggesting right atrial pressure of 3 mmHg.    CHA2DS2-VASc Score = 6  The patient's score is based upon: CHF History: 1 HTN History: 1 Diabetes History: 0 Stroke History: 0 Vascular Disease History: 1 Age Score: 2 Gender Score: 1       ASSESSMENT AND PLAN: Persistent Atrial Fibrillation/atrial flutter (ICD10:  I48.19) The patient's CHA2DS2-VASc score is 6, indicating a 9.7% annual risk of stroke.   S/p DCCV 11/18/23 with quick return of afib.  We discussed rhythm control options today. Would avoid class IC with h/o CAD. Considered Multaq, dofetilide,  amiodarone, and ablation. Patient would like to wait until after her echo on 8/20. If EF normal, then will start Multaq 5 days prior to repeat DCCV. She did miss a dose of Eliquis  and so repeat DCCV will need to be scheduled after 9/5. Continue Eliquis  5 mg BID. If Hgb continues to decline, will need to defer DCCV until anemia workup is complete.  Continue diltiazem  30 mg PRN q 4 hours for heart racing Continue labetalol  200 mg BID  Secondary Hypercoagulable State (ICD10:  D68.69) The patient is at significant risk for stroke/thromboembolism based upon her CHA2DS2-VASc Score of 6.  Continue Apixaban  (Eliquis ). No bleeding issues.   HTN Stable on current regimen  CAD No anginal symptoms Followed by Dr Okey  Obesity Body mass index is 34.42 kg/m.  Encouraged lifestyle modification   Follow up in the AF clinic pending echo results and timing of DCCV.    Informed Consent   Shared Decision Making/Informed Consent The risks (stroke,  cardiac arrhythmias rarely resulting in the need for a temporary or permanent pacemaker, skin irritation or burns and complications associated with conscious sedation including aspiration, arrhythmia, respiratory failure and death), benefits (restoration of normal sinus rhythm) and alternatives of a direct current cardioversion were explained in detail to Ms. Hoben and she agrees to proceed.       Daril Kicks PA-C Afib Clinic [facility] [address] [place], [place] [address] [phone]

## 2023-12-02 ENCOUNTER — Encounter: Payer: Self-pay | Admitting: Podiatry

## 2023-12-02 ENCOUNTER — Ambulatory Visit (HOSPITAL_COMMUNITY)
Admission: RE | Admit: 2023-12-02 | Discharge: 2023-12-02 | Disposition: A | Discharge status: Home / Self Care | Source: Ambulatory Visit | Attending: Cardiovascular Disease | Admitting: Cardiovascular Disease

## 2023-12-02 ENCOUNTER — Ambulatory Visit: Admitting: Podiatry

## 2023-12-02 DIAGNOSIS — B351 Tinea unguium: Secondary | ICD-10-CM | POA: Diagnosis not present

## 2023-12-02 DIAGNOSIS — M79674 Pain in right toe(s): Secondary | ICD-10-CM | POA: Diagnosis not present

## 2023-12-02 DIAGNOSIS — I5032 Chronic diastolic (congestive) heart failure: Secondary | ICD-10-CM

## 2023-12-02 DIAGNOSIS — M79675 Pain in left toe(s): Secondary | ICD-10-CM

## 2023-12-02 DIAGNOSIS — I480 Paroxysmal atrial fibrillation: Secondary | ICD-10-CM

## 2023-12-02 LAB — ECHOCARDIOGRAM COMPLETE
AR max vel: 2.3 cm2
AV Area VTI: 2.18 cm2
AV Area mean vel: 2.29 cm2
AV Mean grad: 2 mmHg
AV Peak grad: 4.4 mmHg
Ao pk vel: 1.05 m/s
Area-P 1/2: 2.99 cm2
MV VTI: 1.06 cm2
S' Lateral: 3.2 cm

## 2023-12-07 NOTE — Progress Notes (Signed)
 Subjective:  Patient ID: [name], female    DOB: 05/16/1945,  MRN: [id number]  VIVI PICCIRILLI presents to clinic today for painful thick toenails that are difficult to trim. Pain interferes with ambulation. Aggravating factors include wearing enclosed shoe gear. Pain is relieved with periodic professional debridement.  Chief Complaint  Patient presents with   RFC     RFC Non diabtic toenail trim. LOV with PCP 11/2023.   New problem(s): None.   PCP is Dayna Motto, DO.  Allergies  Allergen Reactions   Azithromycin Swelling    Other reaction(s): swelling   Other Itching, Swelling, Other (See Comments) and Cough    Horse products Other reaction(s): reaction Other reaction(s): Unknown Other reaction(s): rash   Penicillins Swelling    Has patient had a PCN reaction causing immediate rash, facial/tongue/throat swelling, SOB or lightheadedness with hypotension: Yes Has patient had a PCN reaction causing severe rash involving mucus membranes or skin necrosis: No Has patient had a PCN reaction that required hospitalization: No Has patient had a PCN reaction occurring within the last 10 years: No If all of the above answers are NO, then may proceed with Cephalosporin use.  Other reaction(s): rash Other reaction(s): rash   Tetanus Toxoids Swelling    Other reaction(s): sickness   Gadolinium Derivatives Hives and Itching    Pt stated that her left arm was itching and I noticed that she had two hives on her chest. No difficulty breathing, sneezing. One 25mg  of benadryl was ordered by Dr. Trudy. Pt remained at facility and was monitored before going home. -ldrake   Morphine And Codeine Nausea And Vomiting   Cefazolin     Other reaction(s): rash Other reaction(s): rash   Horse-Derived Products     Other reaction(s): reaction   Latex    Penicillin G     Other reaction(s): Unknown   Tessalon [Benzonatate] Other (See Comments)    hypotension   Amoxicillin-Pot Clavulanate  Rash    Has patient had a PCN reaction causing immediate rash, facial/tongue/throat swelling, SOB or lightheadedness with hypotension: Yes Has patient had a PCN reaction causing severe rash involving mucus membranes or skin necrosis: Yes Has patient had a PCN reaction that required hospitalization:No Has patient had a PCN reaction occurring within the last 10 years: No If all of the above answers are NO, then may proceed with Cephalosporin use.  Other reaction(s): rash, swelling    Review of Systems: Negative except as noted in the HPI.  Objective: No changes noted in today's physical examination. There were no vitals filed for this visit. [name] is a pleasant 78 y.o. female in NAD. AAO x 3.  Vascular Examination: Capillary refill time immediate b/l. Palpable pedal pulses. Pedal hair present b/l. No pain with calf compression b/l. Skin temperature gradient WNL b/l. No cyanosis or clubbing b/l. No ischemia or gangrene noted b/l.   Neurological Examination: Sensation grossly intact b/l with 10 gram monofilament. Vibratory sensation intact b/l.   Dermatological Examination: Pedal skin with normal turgor, texture and tone b/l.  No open wounds. No interdigital macerations.   Toenails 1-5 b/l thick, discolored, elongated with subungual debris and pain on dorsal palpation.   Incurvated nailplate left great toe medial border(s) with tenderness to palpation. No erythema, no edema, no drainage noted.  No corns, calluses nor porokeratotic lesions noted.  Musculoskeletal Examination: Muscle strength 5/5 to all lower extremity muscle groups bilaterally. No pain, crepitus or joint limitation noted with ROM bilateral LE. No  gross bony deformities bilaterally.  Radiographs: None  Assessment/Plan: 1. Pain due to onychomycosis of toenails of both feet   Patient was evaluated and treated. All patient's and/or POA's questions/concerns addressed on today's visit. Mycotic toenails 1-5  debrided in length and girth without incident. Continue soft, supportive shoe gear daily. Report any pedal injuries to medical professional. Call office if there are any quesitons/concerns. -Patient/POA to call should there be question/concern in the interim.   Return in about 3 months (around 03/03/2024).  Delon LITTIE Merlin, DPM      [facility] LOCATION: [address], [place] [address]                   Office [phone]   [facility] LOCATION: [address] [place], [place] [address] Office [phone]

## 2023-12-11 ENCOUNTER — Other Ambulatory Visit (HOSPITAL_COMMUNITY): Payer: Self-pay

## 2023-12-11 MED ORDER — APIXABAN 5 MG PO TABS: 5.0000 mg | ORAL_TABLET | Freq: Two times a day (BID) | ORAL | Status: DC

## 2024-01-07 ENCOUNTER — Other Ambulatory Visit: Payer: Self-pay

## 2024-01-07 ENCOUNTER — Emergency Department (HOSPITAL_BASED_OUTPATIENT_CLINIC_OR_DEPARTMENT_OTHER)
Admission: EM | Admit: 2024-01-07 | Discharge: 2024-01-07 | Disposition: A | Discharge status: Home / Self Care | Attending: Emergency Medicine | Admitting: Emergency Medicine

## 2024-01-07 ENCOUNTER — Telehealth: Payer: Self-pay | Admitting: Internal Medicine

## 2024-01-07 ENCOUNTER — Emergency Department (HOSPITAL_BASED_OUTPATIENT_CLINIC_OR_DEPARTMENT_OTHER): Admitting: Radiology

## 2024-01-07 ENCOUNTER — Emergency Department (HOSPITAL_BASED_OUTPATIENT_CLINIC_OR_DEPARTMENT_OTHER)

## 2024-01-07 DIAGNOSIS — I2510 Atherosclerotic heart disease of native coronary artery without angina pectoris: Secondary | ICD-10-CM | POA: Insufficient documentation

## 2024-01-07 DIAGNOSIS — Z79899 Other long term (current) drug therapy: Secondary | ICD-10-CM | POA: Insufficient documentation

## 2024-01-07 DIAGNOSIS — Z7901 Long term (current) use of anticoagulants: Secondary | ICD-10-CM | POA: Insufficient documentation

## 2024-01-07 DIAGNOSIS — I4811 Longstanding persistent atrial fibrillation: Secondary | ICD-10-CM | POA: Insufficient documentation

## 2024-01-07 DIAGNOSIS — N183 Chronic kidney disease, stage 3 unspecified: Secondary | ICD-10-CM | POA: Insufficient documentation

## 2024-01-07 DIAGNOSIS — Z85820 Personal history of malignant melanoma of skin: Secondary | ICD-10-CM | POA: Diagnosis not present

## 2024-01-07 DIAGNOSIS — I130 Hypertensive heart and chronic kidney disease with heart failure and stage 1 through stage 4 chronic kidney disease, or unspecified chronic kidney disease: Secondary | ICD-10-CM | POA: Diagnosis not present

## 2024-01-07 DIAGNOSIS — Z8616 Personal history of COVID-19: Secondary | ICD-10-CM | POA: Insufficient documentation

## 2024-01-07 DIAGNOSIS — W2203XA Walked into furniture, initial encounter: Secondary | ICD-10-CM | POA: Insufficient documentation

## 2024-01-07 DIAGNOSIS — S0003XA Contusion of scalp, initial encounter: Secondary | ICD-10-CM | POA: Insufficient documentation

## 2024-01-07 DIAGNOSIS — S0990XA Unspecified injury of head, initial encounter: Secondary | ICD-10-CM | POA: Diagnosis not present

## 2024-01-07 DIAGNOSIS — R55 Syncope and collapse: Secondary | ICD-10-CM | POA: Insufficient documentation

## 2024-01-07 DIAGNOSIS — I3481 Nonrheumatic mitral (valve) annulus calcification: Secondary | ICD-10-CM | POA: Diagnosis not present

## 2024-01-07 DIAGNOSIS — R002 Palpitations: Secondary | ICD-10-CM | POA: Diagnosis not present

## 2024-01-07 DIAGNOSIS — I5032 Chronic diastolic (congestive) heart failure: Secondary | ICD-10-CM | POA: Insufficient documentation

## 2024-01-07 DIAGNOSIS — R270 Ataxia, unspecified: Secondary | ICD-10-CM | POA: Diagnosis not present

## 2024-01-07 DIAGNOSIS — Z91040 Latex allergy status: Secondary | ICD-10-CM | POA: Diagnosis not present

## 2024-01-07 DIAGNOSIS — I4891 Unspecified atrial fibrillation: Secondary | ICD-10-CM | POA: Diagnosis not present

## 2024-01-07 DIAGNOSIS — I700 Atherosclerosis of aorta: Secondary | ICD-10-CM | POA: Diagnosis not present

## 2024-01-07 LAB — CBC
HCT: 37.4 % (ref 36.0–46.0)
Hemoglobin: 12 g/dL (ref 12.0–15.0)
MCH: 32 pg (ref 26.0–34.0)
MCHC: 32.1 g/dL (ref 30.0–36.0)
MCV: 99.7 fL (ref 80.0–100.0)
Platelets: 215 K/uL (ref 150–400)
RBC: 3.75 MIL/uL — ABNORMAL LOW (ref 3.87–5.11)
RDW: 13.1 % (ref 11.5–15.5)
WBC: 6.2 K/uL (ref 4.0–10.5)
nRBC: 0 % (ref 0.0–0.2)

## 2024-01-07 LAB — TROPONIN T, HIGH SENSITIVITY
Troponin T High Sensitivity: <15 ng/L (ref 0–19)
Troponin T High Sensitivity: <15 ng/L (ref 0–19)

## 2024-01-07 LAB — BASIC METABOLIC PANEL WITH GFR
Anion gap: 15 (ref 5–15)
BUN: 11 mg/dL (ref 8–23)
CO2: 22 mmol/L (ref 22–32)
Calcium: 10 mg/dL (ref 8.9–10.3)
Chloride: 106 mmol/L (ref 98–111)
Creatinine, Ser: 1 mg/dL (ref 0.44–1.00)
GFR, Estimated: 58 mL/min — ABNORMAL LOW (ref >60)
Glucose, Bld: 152 mg/dL — ABNORMAL HIGH (ref 70–99)
Potassium: 3.9 mmol/L (ref 3.5–5.1)
Sodium: 143 mmol/L (ref 135–145)

## 2024-01-07 MED ORDER — DILTIAZEM HCL 30 MG PO TABS
30.0000 mg | ORAL_TABLET | Freq: Once | ORAL | Status: AC
  Administered 2024-01-07: 30 mg via ORAL
  Filled 2024-01-07: qty 1

## 2024-01-07 MED ORDER — SODIUM CHLORIDE 0.9 % IV BOLUS
500.0000 mL | Freq: Once | INTRAVENOUS | Status: AC
  Administered 2024-01-07: 500 mL via INTRAVENOUS

## 2024-01-07 MED ORDER — MAGNESIUM SULFATE IN D5W 1-5 GM/100ML-% IV SOLN
1.0000 g | Freq: Once | INTRAVENOUS | Status: AC
  Administered 2024-01-07: 1 g via INTRAVENOUS
  Filled 2024-01-07: qty 100

## 2024-01-07 NOTE — ED Notes (Signed)
 Reviewed AVS/discharge instructions with patient. Time allotted for and all questions answered. Patient is agreeable for d/c and escorted to ED exit by staff.

## 2024-01-07 NOTE — Discharge Instructions (Addendum)
 Continue taking all medications as prescribed.  Like we discussed, if you are feeling lightheaded, and noticed that your heart rate is greater than 110, you may take 30 mg of diltiazem  so long as your top number of your blood pressure is greater than 100.  You may do this every 6 hours as needed.  Please call your cardiologist and your primary care doctor tomorrow for a follow-up appointment.  Return to the emergency department for repeat episodes or chest pain.

## 2024-01-07 NOTE — Telephone Encounter (Signed)
.  SYNCOPECHMG   Pt c/o Syncope: STAT if syncope occurred within 24 hours and pt complains of lightheadedness.   High Priority if episode of passing out, completely, today or in last 24 hours   1. Did you pass out today? Not today   2. When is the last time you passed out? Yesterday evening   3. Has this occurred multiple times? no  4. Did you have any symptoms prior to passing out? Patient has been lightheaded lately   5. Did you fall? If so, are you on a blood thinner? yes and yes on blood thinner   She states that her heart felt tight yesterday around 4PM and she did feel lightheaded before passing out. She states that she has no symptoms right now. Her BP this morning was 161/99 HR 73  141/100 HR 84 yesterday

## 2024-01-07 NOTE — ED Triage Notes (Signed)
 Pt POV reporting syncopal episode last night after getting out of bed. Hit back of head on handrail beside bed, is on Eliquis . Also reports palpitations and chest discomfort that began yesterday evening, hx afib, feels like same.

## 2024-01-07 NOTE — ED Notes (Signed)
 Patient transported to CT

## 2024-01-07 NOTE — ED Provider Notes (Signed)
 [facility] AT [facility] Provider Note  CSN: [id number] Arrival date & time: 01/07/24 1506  Chief Complaint(s) Loss of Consciousness  HPI [name] is a 78 y.o. female who is here today after syncopal episode last evening.  Patient has a history of atrial fibrillation, takes Eliquis , although she tells me that she has had issues with remembering to take this consistently.  She says that she has taken it consistently over the last 1+ week however.  She states that last evening she was getting up to go to the bathroom, lost consciousness and hit the back of her head on a handrail beside the bed.  This was witnessed by the husband.  She also states that she has been feeling a little bit of tightness in her chest.  Patient has persistent atrial fibrillation, takes labetalol .  She is post to take diltiazem  as needed for elevated heart rates, however she tells me that she had understood the instructions on the bottle as telling her not to take it if her systolic blood pressure was greater than 100 so she has not tried it.   Past Medical History Past Medical History:  Diagnosis Date   Anxiety    Arthritis    Bone spur    Coronary artery disease    stent - 1999   Depression    Fibrocystic breast changes    GERD (gastroesophageal reflux disease)    Glucosuria    Hernia, inguinal, left 10/2002   Hypertension    Melanoma (HCC) 10/2008   right shoulder and arm   Menorrhagia    Myocardial infarction [facility]) 1999   Patient Active Problem List   Diagnosis Date Noted   Bilateral hearing loss 09/30/2021   Impacted cerumen of right ear 09/30/2021   Hearing loss 07/16/2021   Chronic kidney disease, stage 3 unspecified (HCC) 03/25/2021   Pure hypercholesterolemia 09/17/2020   History of colonic polyps 09/17/2020   Incontinence of feces 09/17/2020   Diverticular disease of colon 09/17/2020   Colon cancer screening 09/17/2020   Change in bowel habit 09/17/2020    Anxiety 07/16/2020   Cervical disc disease 07/16/2020   Chronic reflux esophagitis 07/16/2020   Dysphagia 07/16/2020   Gastro-esophageal reflux disease without esophagitis 07/16/2020   Left lower quadrant pain 07/16/2020   Long term (current) use of anticoagulants 07/16/2020   Morbid obesity (HCC) 07/16/2020   Osteoarthritis 07/16/2020   Other seasonal allergic rhinitis 07/16/2020   Other specified disorders of bone density and structure, other site 07/16/2020   Personal history of malignant melanoma of skin 07/16/2020   Prediabetes 07/16/2020   Recurrent major depression 07/16/2020   Sleep apnea 07/16/2020   Stented coronary artery 07/16/2020   Vitamin D  deficiency 07/16/2020   Acute respiratory disease due to COVID-19 virus 07/09/2018   Coronary artery disease 07/09/2018   Depression 07/09/2018   Chronic diastolic heart failure (HCC) 07/09/2018   Suspected COVID-19 virus infection 07/09/2018   Heart failure (HCC) 07/09/2018   Pain in both feet 10/07/2016   Daytime somnolence 08/30/2014   Coronary atherosclerosis of native coronary artery 10/17/2013   Hyperlipidemia 10/17/2013   Essential hypertension 10/17/2013   Home Medication(s) Prior to Admission medications   Medication Sig Start Date End Date Taking? Authorizing Provider  metoprolol  tartrate (LOPRESSOR ) 25 MG tablet  10/27/23  Yes [provider]  acetaminophen (TYLENOL) 650 MG CR tablet Taking 3 capsules by mouth two times daily    [provider]  apixaban  (ELIQUIS ) 5 MG  TABS tablet Take 1 tablet (5 mg total) by mouth 2 (two) times daily. 11/30/23   Fenton, Clint R, PA  atorvastatin  (LIPITOR) 20 MG tablet TAKE 1 TABLET BY MOUTH DAILY AT  6 PM Patient taking differently: Take 10 mg by mouth daily. 11/03/23   Okey Vina GAILS, MD  Biotin 5 MG CAPS     [provider]  Cholecalciferol (VITAMIN D3) 125 MCG (5000 UT) CAPS Take 1 capsule by mouth 2 (two) times daily.    [provider]   diltiazem  (CARDIZEM ) 30 MG tablet Take 1 tablet every 4 hours AS NEEDED for AFIB heart rate >110 as long as top BP >100. 11/23/23   Fenton, Clint R, PA  ELIQUIS  5 MG TABS tablet TAKE 1 TABLET BY MOUTH TWICE  DAILY 10/12/23   Ross, Paula V, MD  hydrocortisone 2.5 % cream SMARTSIG:sparingly Topical Twice Daily 09/12/21   [provider]  isosorbide  mononitrate (IMDUR ) 30 MG 24 hr tablet TAKE 1 TABLET BY MOUTH DAILY 11/03/23   Okey Vina GAILS, MD  labetalol  (NORMODYNE ) 200 MG tablet Take 1 tablet (200 mg total) by mouth 2 (two) times daily. 05/29/23   Okey Vina GAILS, MD  lisinopril  (ZESTRIL ) 10 MG tablet Take 1 tablet (10 mg total) by mouth daily. 11/03/23 02/01/24  Daneen Damien BROCKS, NP  nitroGLYCERIN  (NITROSTAT ) 0.4 MG SL tablet DISSOLVE 1 TABLET UNDER THE  TONGUE EVERY 5 MINUTES AS NEEDED FOR CHEST PAIN. MAX OF 3 TABLETS IN 15 MINUTES. CALL 911 IF PAIN  PERSISTS. 11/03/23   Monge, Damien BROCKS, NP  Soft Lens Products (REWETTING DROPS) SOLN Place 1 drop 3 (three) times daily as needed into both eyes (for dry/irritated contact lenses).    [provider]  spironolactone  (ALDACTONE ) 25 MG tablet Take 0.5 tablets (12.5 mg total) by mouth daily. 11/03/23   Okey Vina GAILS, MD                                                                                                                                    Past Surgical History Past Surgical History:  Procedure Laterality Date   ANGIOPLASTY  2000   APPENDECTOMY     bone chip removed from left foot      CARDIOVERSION N/A 11/18/2023   Procedure: CARDIOVERSION;  Surgeon: Barbaraann Darryle Ned, MD;  Location: [facility] INVASIVE CV LAB;  Service: Cardiovascular;  Laterality: N/A;   CHOLECYSTECTOMY     COLONOSCOPY WITH PROPOFOL  N/A 03/03/2017   Procedure: COLONOSCOPY WITH PROPOFOL ;  Surgeon: Kristie Lamprey, MD;  Location: [facility] ENDOSCOPY;  Service: Endoscopy;  Laterality: N/A;   CORONARY ANGIOPLASTY WITH STENT PLACEMENT  10/99   ESOPHAGOGASTRODUODENOSCOPY (EGD) WITH  PROPOFOL  N/A 03/03/2017   Procedure: ESOPHAGOGASTRODUODENOSCOPY (EGD) WITH PROPOFOL ;  Surgeon: Kristie Lamprey, MD;  Location: [facility] ENDOSCOPY;  Service: Endoscopy;  Laterality: N/A;   HERNIA REPAIR     umbilical    HYSTEROSCOPY  2/98   D&C (  polyps)   LEFT HEART CATHETERIZATION WITH CORONARY ANGIOGRAM N/A 01/25/2014   Procedure: LEFT HEART CATHETERIZATION WITH CORONARY ANGIOGRAM;  Surgeon: Candyce GORMAN Reek, MD;  Location: [facility] CATH LAB;  Service: Cardiovascular;  Laterality: N/A;   NODE DISSECTION     neg   TOTAL ABDOMINAL HYSTERECTOMY     LSO     Failed TVH   Family History Family History  Problem Relation Age of Onset   Diabetes Mother    Stroke Mother    Hypertension Mother    Heart disease Father    Hypertension Father    Cancer Father        pancreatic cancer   Heart attack Father     Social History Social History   Tobacco Use   Smoking status: Never   Smokeless tobacco: Never   Tobacco comments:    Never smoked 10/30/23  Substance Use Topics   Alcohol use: Yes    Alcohol/week: 1.0 standard drink of alcohol    Types: 1 Standard drinks or equivalent per week    Comment: occ glass of wine   Drug use: No    Frequency: 5.0 times per week   Allergies Azithromycin, Other, Penicillins, Tetanus toxoid-containing vaccines, Gadolinium derivatives, Morphine and codeine, Cefazolin, Horse-derived products, Latex, Penicillin g, Tessalon [benzonatate], and Amoxicillin-pot clavulanate  Review of Systems Review of Systems  Physical Exam Vital Signs  I have reviewed the triage vital signs BP 120/60   Pulse (!) 108   Temp (!) 97.5 F (36.4 C) (Oral)   Resp 19   Ht 5' 2 (1.575 m)   Wt 83.9 kg   SpO2 94%   BMI 33.84 kg/m   Physical Exam Vitals and nursing note reviewed.  HENT:     Head: Normocephalic.     Comments: Contusion to the back left of the occiput. Eyes:     Pupils: Pupils are equal, round, and reactive to light.  Cardiovascular:     Rate and Rhythm:  Tachycardia present. Rhythm irregular.  Pulmonary:     Effort: Pulmonary effort is normal.  Abdominal:     General: Abdomen is flat.     Palpations: Abdomen is soft.  Musculoskeletal:     Cervical back: Normal range of motion and neck supple. No rigidity.  Lymphadenopathy:     Cervical: No cervical adenopathy.  Neurological:     General: No focal deficit present.     Mental Status: She is alert.     Comments: Ambulatory, patient drove here.     ED Results and Treatments Labs (all labs ordered are listed, but only abnormal results are displayed) Labs Reviewed  BASIC METABOLIC PANEL WITH GFR - Abnormal; Notable for the following components:      Result Value   Glucose, Bld 152 (*)    GFR, Estimated 58 (*)    All other components within normal limits  CBC - Abnormal; Notable for the following components:   RBC 3.75 (*)    All other components within normal limits  TROPONIN T, HIGH SENSITIVITY  TROPONIN T, HIGH SENSITIVITY  Radiology CT Head Wo Contrast Result Date: 01/07/2024 EXAM: CT HEAD WITHOUT CONTRAST 01/07/2024 05:14:35 PM TECHNIQUE: CT of the head was performed without the administration of intravenous contrast. Automated exposure control, iterative reconstruction, and/or weight based adjustment of the mA/kV was utilized to reduce the radiation dose to as low as reasonably achievable. COMPARISON: 10/22/2017 CLINICAL HISTORY: Ataxia, head trauma. Triage note: Pt POV reporting syncopal episode last night after getting out of bed. Hit back of head on handrail beside bed, is on Eliquis . Also reports palpitations and chest discomfort that began yesterday evening, hx afib, feels like same. FINDINGS: BRAIN AND VENTRICLES: No acute hemorrhage. No evidence of acute infarct. No hydrocephalus. No extra-axial collection. No mass effect or midline shift. Mild age-related  atrophy. Subcortical and periventricular small vessel ischemic changes. ORBITS: No acute abnormality. SINUSES: No acute abnormality. SOFT TISSUES AND SKULL: No acute soft tissue abnormality. No skull fracture. IMPRESSION: 1. No acute intracranial abnormality. Electronically signed by: Pinkie Pebbles MD 01/07/2024 05:48 PM EDT RP Workstation: [id number]   DG Chest 2 View Result Date: 01/07/2024 CLINICAL DATA:  Palpitations.  Syncopal episode last night. EXAM: CHEST - 2 VIEW COMPARISON:  07/09/2018 FINDINGS: Heart size and pulmonary vascularity are normal. Calcification in the mitral valve annulus. Lungs are clear. No pleural effusion or pneumothorax. Mediastinal contours appear intact. Calcification of the aorta. Degenerative changes in the spine and shoulders. IMPRESSION: No active cardiopulmonary disease. Electronically Signed   By: Elsie Gravely M.D.   On: 01/07/2024 17:43    Pertinent labs & imaging results that were available during my care of the patient were reviewed by me and considered in my medical decision making (see MDM for details).  Medications Ordered in ED Medications  sodium chloride  0.9 % bolus 500 mL (0 mLs Intravenous Stopped 01/07/24 1654)  magnesium  sulfate IVPB 1 g 100 mL (0 g Intravenous Stopped 01/07/24 1654)  diltiazem  (CARDIZEM ) tablet 30 mg (30 mg Oral Given 01/07/24 1545)                                                                                                                                     Procedures Procedures  (including critical care time)  Medical Decision Making / ED Course   This patient presents to the ED for concern of syncope and a fall, this involves an extensive number of treatment options, and is a complaint that carries with it a high risk of complications and morbidity.  The differential diagnosis includes syncope, cardiac arrhythmia, atrial fibrillation, atrial fibrillation with RVR, intracranial hemorrhage.  MDM: Patient overall  well-appearing on exam.  She was able to drive to the emergency room today.  She is currently taking care of her husband who has a broken arm.  Regarding the traumatic injury, will obtain imaging of the patient's head.  She has no tenderness whatsoever in her neck, no neurodeficits.  Do not believe requires imaging of the cervical spine  at this time.  Patient appears to be in atrial fibrillation, rate is intermittently elevated.  Sounds though she misunderstood what her appropriate use of her as needed diltiazem  is.  Will give her some of her p.o. diltiazem .  Patient is not a great candidate for cardioversion as she has been inconsistently taking her Eliquis .  She says that she was cardioverted once previously but it only lasted for 2 days.  Reassessment 6:10 PM-patient's heart rate is come down nicely with her diltiazem  being taken as prescribed.  Her head CT is negative.  She has been ambulatory here in the ED without any difficulty.  Patient very much prefer to go home.  I believe this is reasonable.  She will follow-up with her primary care doctor and her cardiologist.  Will discharge patient.    Additional history obtained: -Additional history obtained from husband at bedside -External records from outside source obtained and reviewed including: Chart review including previous notes, labs, imaging, consultation notes   Lab Tests: -I ordered, reviewed, and interpreted labs.   The pertinent results include:   Labs Reviewed  BASIC METABOLIC PANEL WITH GFR - Abnormal; Notable for the following components:      Result Value   Glucose, Bld 152 (*)    GFR, Estimated 58 (*)    All other components within normal limits  CBC - Abnormal; Notable for the following components:   RBC 3.75 (*)    All other components within normal limits  TROPONIN T, HIGH SENSITIVITY  TROPONIN T, HIGH SENSITIVITY      EKG atrial fibrillation, rapid ventricular rate.  EKG Interpretation Date/Time:     Ventricular Rate:    PR Interval:    QRS Duration:    QT Interval:    QTC Calculation:   R Axis:      Text Interpretation:           Imaging Studies ordered: I ordered imaging studies including head CT, chest x-ray I independently visualized and interpreted imaging. I agree with the radiologist interpretation   Medicines ordered and prescription drug management: Meds ordered this encounter  Medications   sodium chloride  0.9 % bolus 500 mL   magnesium  sulfate IVPB 1 g 100 mL   diltiazem  (CARDIZEM ) tablet 30 mg    -I have reviewed the patients home medicines and have made adjustments as needed  Critical interventions Management of atrial fibrillation with rapid ventricular rate   Cardiac Monitoring: The patient was maintained on a cardiac monitor.  I personally viewed and interpreted the cardiac monitored which showed an underlying rhythm of: Fibrillation, atrial  Social Determinants of Health:  Factors impacting patients care include: Multiple medical comorbidities including   Reevaluation: After the interventions noted above, I reevaluated the patient and found that they have :improved  Co morbidities that complicate the patient evaluation  Past Medical History:  Diagnosis Date   Anxiety    Arthritis    Bone spur    Coronary artery disease    stent - 1999   Depression    Fibrocystic breast changes    GERD (gastroesophageal reflux disease)    Glucosuria    Hernia, inguinal, left 10/2002   Hypertension    Melanoma (HCC) 10/2008   right shoulder and arm   Menorrhagia    Myocardial infarction [facility]) 1999      Dispostion: I considered admission for this patient, however with her reassuring workup, her improvement, and her desire to go home I believe she is appropriate for discharge  with close follow-up.     Final Clinical Impression(s) / ED Diagnoses Final diagnoses:  Longstanding persistent atrial fibrillation (HCC)  Syncope, unspecified syncope  type     @PCDICTATION @    Mannie Pac T, DO 01/07/24 1815

## 2024-01-07 NOTE — Telephone Encounter (Signed)
 Spoke with patient of Dr. Okey. She has a two week history of lightheadedness at night when lying down - reports BP and HR are generally normal. This would last a few minutes. Yesterday she laid down for a nap at 4pm, then at 5pm she started to feel unwell - at 7pm she tried to get up and she passed out, fell, hit head. She has afib, is in on eliquis . She also notes chest tightness yesterday as well. She may have not been as well hydrated yesterday. Her husband was home at the time. BP yesterday after episode 134/99, HR 80. BP today 152/97, HR 94. She still feels lightheaded today - has been able to move around her house, using her cane -- but would not attempt driving.  Advised patient that since she is on eliquis  and hit her head yesterday, she needs evaluation at ED (?head CT). Advised ED can also check EKG given chest tightness.   She is concerned about her two week h/o lightheadedness also. Advised will notify her MD  She has a scheduled appointment on 01/29/24 @ 8:40am

## 2024-01-08 DIAGNOSIS — I951 Orthostatic hypotension: Secondary | ICD-10-CM | POA: Diagnosis not present

## 2024-01-08 DIAGNOSIS — R55 Syncope and collapse: Secondary | ICD-10-CM | POA: Diagnosis not present

## 2024-01-13 NOTE — Telephone Encounter (Signed)
 Okey Vina GAILS, MD to Me (Selected Message)  01/12/24  3:48 PM Pt seen in ED on 01/07/24  See about getting appt in afib clinic sooner    Spoke with patient. She still has dizziness. Has not driven. Scheduled for 10/2 @ 11am with DOROTHA Heinrich PA in afib clinic

## 2024-01-14 ENCOUNTER — Emergency Department (HOSPITAL_COMMUNITY)

## 2024-01-14 ENCOUNTER — Encounter (HOSPITAL_COMMUNITY): Payer: Self-pay | Admitting: Internal Medicine

## 2024-01-14 ENCOUNTER — Inpatient Hospital Stay (HOSPITAL_COMMUNITY)
Admission: RE | Admit: 2024-01-14 | Discharge: 2024-01-14 | Disposition: A | Discharge status: Home / Self Care | Source: Ambulatory Visit | Attending: Internal Medicine | Admitting: Internal Medicine

## 2024-01-14 ENCOUNTER — Other Ambulatory Visit: Payer: Self-pay

## 2024-01-14 ENCOUNTER — Inpatient Hospital Stay (HOSPITAL_COMMUNITY)
Admission: EM | Admit: 2024-01-14 | Discharge: 2024-01-19 | DRG: 243 | Disposition: A | Discharge status: Home / Self Care

## 2024-01-14 ENCOUNTER — Ambulatory Visit (HOSPITAL_COMMUNITY)
Admission: RE | Admit: 2024-01-14 | Discharge: 2024-01-14 | Disposition: A | Discharge status: Home / Self Care | Source: Ambulatory Visit | Attending: Internal Medicine | Admitting: Internal Medicine

## 2024-01-14 ENCOUNTER — Telehealth: Payer: Self-pay | Admitting: Physician Assistant

## 2024-01-14 VITALS — BP 98/80 | HR 118 | Ht 62.0 in | Wt 185.6 lb

## 2024-01-14 DIAGNOSIS — R55 Syncope and collapse: Secondary | ICD-10-CM

## 2024-01-14 DIAGNOSIS — R197 Diarrhea, unspecified: Secondary | ICD-10-CM | POA: Diagnosis not present

## 2024-01-14 DIAGNOSIS — K219 Gastro-esophageal reflux disease without esophagitis: Secondary | ICD-10-CM | POA: Diagnosis present

## 2024-01-14 DIAGNOSIS — I700 Atherosclerosis of aorta: Secondary | ICD-10-CM | POA: Diagnosis not present

## 2024-01-14 DIAGNOSIS — Z833 Family history of diabetes mellitus: Secondary | ICD-10-CM

## 2024-01-14 DIAGNOSIS — Z881 Allergy status to other antibiotic agents status: Secondary | ICD-10-CM | POA: Diagnosis not present

## 2024-01-14 DIAGNOSIS — Z8249 Family history of ischemic heart disease and other diseases of the circulatory system: Secondary | ICD-10-CM

## 2024-01-14 DIAGNOSIS — I252 Old myocardial infarction: Secondary | ICD-10-CM

## 2024-01-14 DIAGNOSIS — I459 Conduction disorder, unspecified: Secondary | ICD-10-CM | POA: Diagnosis present

## 2024-01-14 DIAGNOSIS — Z955 Presence of coronary angioplasty implant and graft: Secondary | ICD-10-CM

## 2024-01-14 DIAGNOSIS — I4430 Unspecified atrioventricular block: Secondary | ICD-10-CM | POA: Diagnosis not present

## 2024-01-14 DIAGNOSIS — Z7901 Long term (current) use of anticoagulants: Secondary | ICD-10-CM

## 2024-01-14 DIAGNOSIS — I050 Rheumatic mitral stenosis: Secondary | ICD-10-CM | POA: Diagnosis present

## 2024-01-14 DIAGNOSIS — Z79899 Other long term (current) drug therapy: Secondary | ICD-10-CM

## 2024-01-14 DIAGNOSIS — Z823 Family history of stroke: Secondary | ICD-10-CM

## 2024-01-14 DIAGNOSIS — D6869 Other thrombophilia: Secondary | ICD-10-CM

## 2024-01-14 DIAGNOSIS — I110 Hypertensive heart disease with heart failure: Secondary | ICD-10-CM | POA: Diagnosis present

## 2024-01-14 DIAGNOSIS — I3481 Nonrheumatic mitral (valve) annulus calcification: Secondary | ICD-10-CM | POA: Diagnosis not present

## 2024-01-14 DIAGNOSIS — E785 Hyperlipidemia, unspecified: Secondary | ICD-10-CM | POA: Diagnosis present

## 2024-01-14 DIAGNOSIS — I4819 Other persistent atrial fibrillation: Secondary | ICD-10-CM | POA: Diagnosis not present

## 2024-01-14 DIAGNOSIS — I495 Sick sinus syndrome: Principal | ICD-10-CM | POA: Diagnosis present

## 2024-01-14 DIAGNOSIS — R001 Bradycardia, unspecified: Secondary | ICD-10-CM | POA: Diagnosis not present

## 2024-01-14 DIAGNOSIS — Z950 Presence of cardiac pacemaker: Secondary | ICD-10-CM | POA: Diagnosis not present

## 2024-01-14 DIAGNOSIS — Z23 Encounter for immunization: Secondary | ICD-10-CM | POA: Diagnosis not present

## 2024-01-14 DIAGNOSIS — I455 Other specified heart block: Principal | ICD-10-CM

## 2024-01-14 DIAGNOSIS — Z66 Do not resuscitate: Secondary | ICD-10-CM | POA: Diagnosis present

## 2024-01-14 DIAGNOSIS — R079 Chest pain, unspecified: Secondary | ICD-10-CM | POA: Diagnosis not present

## 2024-01-14 DIAGNOSIS — Z85820 Personal history of malignant melanoma of skin: Secondary | ICD-10-CM | POA: Diagnosis not present

## 2024-01-14 DIAGNOSIS — Z91040 Latex allergy status: Secondary | ICD-10-CM | POA: Diagnosis not present

## 2024-01-14 DIAGNOSIS — I442 Atrioventricular block, complete: Secondary | ICD-10-CM | POA: Diagnosis not present

## 2024-01-14 DIAGNOSIS — Z9109 Other allergy status, other than to drugs and biological substances: Secondary | ICD-10-CM

## 2024-01-14 DIAGNOSIS — I4891 Unspecified atrial fibrillation: Secondary | ICD-10-CM

## 2024-01-14 DIAGNOSIS — Z880 Allergy status to penicillin: Secondary | ICD-10-CM

## 2024-01-14 DIAGNOSIS — R918 Other nonspecific abnormal finding of lung field: Secondary | ICD-10-CM | POA: Diagnosis not present

## 2024-01-14 DIAGNOSIS — Z90710 Acquired absence of both cervix and uterus: Secondary | ICD-10-CM

## 2024-01-14 DIAGNOSIS — Z800 Family history of malignant neoplasm of digestive organs: Secondary | ICD-10-CM

## 2024-01-14 DIAGNOSIS — I5032 Chronic diastolic (congestive) heart failure: Secondary | ICD-10-CM | POA: Diagnosis present

## 2024-01-14 DIAGNOSIS — N179 Acute kidney failure, unspecified: Secondary | ICD-10-CM | POA: Diagnosis not present

## 2024-01-14 DIAGNOSIS — Z887 Allergy status to serum and vaccine status: Secondary | ICD-10-CM

## 2024-01-14 DIAGNOSIS — Z885 Allergy status to narcotic agent status: Secondary | ICD-10-CM

## 2024-01-14 DIAGNOSIS — I2510 Atherosclerotic heart disease of native coronary artery without angina pectoris: Secondary | ICD-10-CM | POA: Diagnosis not present

## 2024-01-14 DIAGNOSIS — R0989 Other specified symptoms and signs involving the circulatory and respiratory systems: Secondary | ICD-10-CM | POA: Diagnosis not present

## 2024-01-14 LAB — BASIC METABOLIC PANEL WITH GFR
Anion gap: 11 (ref 5–15)
BUN: 19 mg/dL (ref 8–23)
CO2: 19 mmol/L — ABNORMAL LOW (ref 22–32)
Calcium: 9.5 mg/dL (ref 8.9–10.3)
Chloride: 107 mmol/L (ref 98–111)
Creatinine, Ser: 1.29 mg/dL — ABNORMAL HIGH (ref 0.44–1.00)
GFR, Estimated: 43 mL/min — ABNORMAL LOW (ref >60)
Glucose, Bld: 105 mg/dL — ABNORMAL HIGH (ref 70–99)
Potassium: 4.1 mmol/L (ref 3.5–5.1)
Sodium: 137 mmol/L (ref 135–145)

## 2024-01-14 LAB — CBC
HCT: 43.2 % (ref 36.0–46.0)
Hemoglobin: 13.6 g/dL (ref 12.0–15.0)
MCH: 31.6 pg (ref 26.0–34.0)
MCHC: 31.5 g/dL (ref 30.0–36.0)
MCV: 100.2 fL — ABNORMAL HIGH (ref 80.0–100.0)
Platelets: 227 K/uL (ref 150–400)
RBC: 4.31 MIL/uL (ref 3.87–5.11)
RDW: 12.6 % (ref 11.5–15.5)
WBC: 6 K/uL (ref 4.0–10.5)
nRBC: 0 % (ref 0.0–0.2)

## 2024-01-14 LAB — PHOSPHORUS: Phosphorus: 3.9 mg/dL (ref 2.5–4.6)

## 2024-01-14 LAB — TROPONIN I (HIGH SENSITIVITY): Troponin I (High Sensitivity): 7 ng/L (ref <18)

## 2024-01-14 LAB — MAGNESIUM: Magnesium: 1.5 mg/dL — ABNORMAL LOW (ref 1.7–2.4)

## 2024-01-14 MED ORDER — MAGNESIUM SULFATE 2 GM/50ML IV SOLN
2.0000 g | Freq: Once | INTRAVENOUS | Status: AC
  Administered 2024-01-14: 2 g via INTRAVENOUS
  Filled 2024-01-14: qty 50

## 2024-01-14 MED ORDER — ACETAMINOPHEN 325 MG PO TABS
650.0000 mg | ORAL_TABLET | Freq: Three times a day (TID) | ORAL | Status: DC | PRN
  Administered 2024-01-15 – 2024-01-19 (×10): 650 mg via ORAL
  Filled 2024-01-14 (×11): qty 2

## 2024-01-14 MED ORDER — ACETAMINOPHEN 325 MG PO TABS: 650.0000 mg | ORAL_TABLET | ORAL | Status: DC | PRN

## 2024-01-14 MED ORDER — ONDANSETRON HCL 4 MG/2ML IJ SOLN: 4.0000 mg | Freq: Four times a day (QID) | INTRAMUSCULAR | Status: DC | PRN

## 2024-01-14 MED ORDER — ATORVASTATIN CALCIUM 10 MG PO TABS
10.0000 mg | ORAL_TABLET | Freq: Every day | ORAL | Status: DC
  Administered 2024-01-15 – 2024-01-19 (×5): 10 mg via ORAL
  Filled 2024-01-14 (×5): qty 1

## 2024-01-14 NOTE — Telephone Encounter (Addendum)
   [employer] called the answering service after-hours today to notify of autotriggered event at 5:15pm of 9.8 second pause. They reached out to the patient who reported she felt dizzy during the episode and was laying in bed. At time of call her dizziness had resolved. She was seen by Afib clinic today with persistent Afib (in RVR HR 118 at time of OV) and multiple episodes of syncope prompting Zio placement. The strips are not currently available on [employer]. Have asked for strip to be faxed to cath lab fax.  Given now evidence of potential tachybrady syndrome with 9.8 sec pause while awake, meanwhile trying to balance AF RVR, have recommended she proceed to ED now for evaluation and not to drive herself. EMS precautions reviewed. Suspect she will need EP consult in AM. She is in agreement and verbalized understanding and gratitude.  Lum Stillinger N Bhakti Labella, PA-C  Addendum: faxed copy of strip scanned under Media.

## 2024-01-14 NOTE — ED Triage Notes (Signed)
 Patient was sent by PCP after heart monitor showed that she had a 9.8 second pause earlier today. Intermittent central chest pain without radiation.

## 2024-01-14 NOTE — Patient Instructions (Signed)
 Remove monitor 01/28/24

## 2024-01-14 NOTE — ED Triage Notes (Signed)
 Pt states that she is currently wearing a heart monitor and had her cardiologist call her earlier today after noticing an almost 10 second pause in her HR. Pt then referred to ED. States she has been having afib on and off since April. Denies CP, palpitations, SOB. States she has been feeling lightheaded for the past two weeks intermittently. Anticoagulated on Eliquis , last dose today.

## 2024-01-14 NOTE — Progress Notes (Signed)
 Primary Care Physician: Dayna Motto, DO Primary Cardiologist: Vina Gull, MD Electrophysiologist: None  Referring Physician: Damien Braver, NP     [name] is a 78 y.o. female with a history of CAD s/p PCI/stenting-LAD in 1999 and 2002, chronic diastolic CHF, HTN, HLD, prediabetes, esophageal stricture, GERD, and atrial fibrillation who presents for follow up in the [facility]. Seen by Cardiology on 7/15 noted to be in atrial flutter with RVR. Cardiac monitor placed at office visit. Patient is on Eliquis  for stroke prevention.   On evaluation today, she is currently in atrial flutter with RVR. Patient's husband noted ECG performed at PCP office several weeks ago also showed abnormal heart rhythm. No missed doses of Eliquis .   On follow up 11/16/23, patient is currently in atrial flutter with RVR. Patient was scheduled for DCCV after last visit but canceled procedure. She admittedly was scared to do procedure and then subsequently was told by friends how it helped them get into normal rhythm / feel better. Heart monitor placed by Cardiology on 7/15 resulted 8/1 showing 100% Afib burden. No missed doses of Eliquis .   Follow up 11/30/23. Patient returns for follow up for atrial fibrillation. Patient is s/p DCCV 11/18/23. Unfortunately, she has had quick return of afib. She was in SR only for two days. Of note, her Hgb had dropped from 15.7 to 10.7. She had labs redrawn by her PCP earlier today. No overt bleeding.   Follow up 01/14/24. Patient is currently in Afib. Syncopal episode on 9/24 that occurred after patient stood up from bed to go to bathroom. She went to ED on 9/25 and ECG showed Afib; CT Head negative. Patient notes since ED visit she has fainted an additional 3-4 times. She is working to improve fluid status. She last missed dose of Eliquis  on 9/15. We note low BP today but patient has BP diary from home which shows it is overall an outlier; she took  diltiazem  30 mg PRN earlier this morning. BP diary shows majority of BP readings 130-150.   Today, she  denies symptoms of palpitations, chest pain, shortness of breath, orthopnea, PND, lower extremity edema, dizziness, presyncope, syncope, snoring, daytime somnolence, bleeding, or neurologic sequela. The patient is tolerating medications without difficulties and is otherwise without complaint today.    she has a BMI of Body mass index is 33.95 kg/m.[name] Filed Weights   01/14/24 1109  Weight: 84.2 kg     Current Outpatient Medications  Medication Sig Dispense Refill   acetaminophen (TYLENOL) 650 MG CR tablet Taking 3 capsules by mouth two times daily     apixaban  (ELIQUIS ) 5 MG TABS tablet Take 1 tablet (5 mg total) by mouth 2 (two) times daily. 28 tablet    atorvastatin  (LIPITOR) 20 MG tablet TAKE 1 TABLET BY MOUTH DAILY AT  6 PM (Patient taking differently: Take 10 mg by mouth daily.) 100 tablet 1   Biotin 5 MG CAPS  (Patient taking differently: Taking 5000 units by mouth daily)     Cholecalciferol (VITAMIN D3) 125 MCG (5000 UT) CAPS Take 1 capsule by mouth 2 (two) times daily.     diltiazem  (CARDIZEM ) 30 MG tablet Take 1 tablet every 4 hours AS NEEDED for AFIB heart rate >110 as long as top BP >100. 45 tablet 1   ELIQUIS  5 MG TABS tablet TAKE 1 TABLET BY MOUTH TWICE  DAILY 200 tablet 2   hydrocortisone 2.5 % cream SMARTSIG:sparingly Topical Twice Daily  isosorbide  mononitrate (IMDUR ) 30 MG 24 hr tablet TAKE 1 TABLET BY MOUTH DAILY 100 tablet 1   labetalol  (NORMODYNE ) 200 MG tablet Take 1 tablet (200 mg total) by mouth 2 (two) times daily. 180 tablet 3   lisinopril  (ZESTRIL ) 10 MG tablet Take 1 tablet (10 mg total) by mouth daily. 90 tablet 3   metoprolol  tartrate (LOPRESSOR ) 25 MG tablet      nitroGLYCERIN  (NITROSTAT ) 0.4 MG SL tablet DISSOLVE 1 TABLET UNDER THE  TONGUE EVERY 5 MINUTES AS NEEDED FOR CHEST PAIN. MAX OF 3 TABLETS IN 15 MINUTES. CALL 911 IF PAIN  PERSISTS. 75 tablet 3    Soft Lens Products (REWETTING DROPS) SOLN Place 1 drop 3 (three) times daily as needed into both eyes (for dry/irritated contact lenses).     spironolactone  (ALDACTONE ) 25 MG tablet Take 0.5 tablets (12.5 mg total) by mouth daily. 50 tablet 1   No current facility-administered medications for this encounter.    Atrial Fibrillation Management history:  Previous antiarrhythmic drugs: none Previous cardioversions: 11/18/23 Previous ablations: none Anticoagulation history: Eliquis    ROS- All systems are reviewed and negative except as per the HPI above.  Physical Exam: BP 98/80   Pulse (!) 118   Ht 5' 2 (1.575 m)   Wt 84.2 kg   BMI 33.95 kg/m   GEN- The patient is well appearing, alert and oriented x 3 today.   Neck - no JVD or carotid bruit noted Lungs- Clear to ausculation bilaterally, normal work of breathing Heart- Irregular tachycardic rate and rhythm, no murmurs, rubs or gallops, PMI not laterally displaced Extremities- no clubbing, cyanosis, or edema Skin - no rash or ecchymosis noted   EKG today demonstrates  Vent. rate 118 BPM PR interval * ms QRS duration 74 ms QT/QTcB 324/454 ms P-R-T axes * 36 161 Atrial fibrillation with rapid ventricular response Septal infarct , age undetermined Abnormal ECG When compared with ECG of 07-Jan-2024 15:19, PREVIOUS ECG IS PRESENT   Echo 12/02/23: 1. Left ventricular ejection fraction, by estimation, is 60 to 65%. The  left ventricle has normal function. The left ventricle has no regional  wall motion abnormalities. Left ventricular diastolic function could not  be evaluated.   2. Right ventricular systolic function is normal. The right ventricular  size is mildly enlarged. There is normal pulmonary artery systolic  pressure. The estimated right ventricular systolic pressure is 30.0 mmHg.   3. Left atrial size was severely dilated.   4. Right atrial size was mildly dilated.   5. MVA 1.06 cm2 by continuity. The mitral valve  is degenerative. Mild  mitral valve regurgitation. Moderate to severe mitral stenosis. The mean  mitral valve gradient is 8.7 mmHg with average heart rate of 90 bpm.  Severe mitral annular calcification.   6. The aortic valve is tricuspid. Aortic valve regurgitation is not  visualized. No aortic stenosis is present.   7. The inferior vena cava is normal in size with greater than 50%  respiratory variability, suggesting right atrial pressure of 3 mmHg.   Comparison(s): Changes from prior study are noted. LVEF unchanged. MV  stenosis is moderate to severe.    CHA2DS2-VASc Score = 6  The patient's score is based upon: CHF History: 1 HTN History: 1 Diabetes History: 0 Stroke History: 0 Vascular Disease History: 1 Age Score: 2 Gender Score: 1      ASSESSMENT AND PLAN: Persistent Atrial Fibrillation/atrial flutter (ICD10:  I48.19) The patient's CHA2DS2-VASc score is 6, indicating a 9.7% annual risk  of stroke.   S/p DCCV 11/18/23 with quick return of afib.   Patient is currently in Afib. Due to patient having multiple syncopal episodes since ED visit, will hold off on starting AAD therapy and repeat cardioversion. We will place live Zio monitor for 2 weeks to assess given multiple syncopal episodes. After monitor, patient agrees would like to start Multaq and repeat DCCV. Labs were drawn in ED on 9/25. We briefly revisited discussion about medication treatments and ablation in detail. We discussed potential options such as Multaq, Tikosyn, and amiodarone. We talked about the monitoring required for these medications, hospital admission for Tikosyn, and potential adverse effects. We also discussed pulse field ablation as an option in the form of intervention. After discussion, patient is still interested in trial of Multaq. Will plan to start Multaq 400 mg BID 5 days prior to repeat DCCV.   Continue diltiazem  30 mg PRN q 4 hours for heart racing Continue labetalol  200 mg BID  Secondary  Hypercoagulable State (ICD10:  D68.69) The patient is at significant risk for stroke/thromboembolism based upon her CHA2DS2-VASc Score of 6.  Continue Apixaban  (Eliquis ).  Repeat CBC on 9/25 shows interval improvement in Hgb 12.0 from 10.7.   Repeat echo on 8/20 shows progression in mitral disease with new moderate to severe mitral stenosis. After discussion with primary cardiologist regarding finding, agree to continue Eliquis  for now and at upcoming visit will discuss this finding / possibility of transition to coumadin.  HTN Continue to monitor at home.   CAD No anginal symptoms.  Followed by Dr Okey    Will call patient with live monitor results. Follow up will be 2 weeks after DCCV.   Dorn Heinrich, [facility] [address] [place], [place] [address] [phone]

## 2024-01-14 NOTE — H&P (Addendum)
 Cardiology Admission History and Physical  Patient ID: EMERITA BERKEMEIER MRN: [id number]; DOB: [birth year]   Admission date: 01/14/2024  PCP:  Dayna Motto, DO   [facility] Providers Cardiologist:  Vina Gull, MD     Chief Complaint:  Syncope  Patient Profile: [name] is a 78 y.o. female with afib, CAD s/p PCI in 1999 and 2002, HFpEF, htn, hld, prediabetes, esophageal stricture, GERD who is being seen 01/14/2024 for the evaluation of pause.  History of Present Illness: [name] presents to the ED as she was instructed to do so after an 9.8 pause was identified on Zio patch.   The patient has a longstanding history of afib dating back to January of 2020. She has been on labetalol  for some time now and has never had catheter ablation or anti-arrhythmic pharmacologic measures.   She underwent DCCV on 11/18/23 but was only in sinus rhythm for 2 days and then converted back to afib. The patient was seen in afib clinic on 8/18 with plans to start Multaq 5 days prior to repeat DCCV. At that time, she was taking labetalol  200 mg bid along with diltiazem  30 mg prn q4hrs. She states she has taken diltiazem  ~4 times over the past 2 weeks for elevated heart rates.   The patient has had several syncopal episodes including a recent ED visit for syncope on 9/25. CTH was unremarkable at that time.   She was seen back in afib clinic earlier today after the ED visit on 9/25. Since that ED visit, she had an additional 3-4 syncopal episodes. The episodes seem to occur more frequently at night with lying flat . Zio patch placed today, and she had a 9.8 second pause with associated syncope. She was directed to the ED for further evaluation.   Of note, she does report several days of diarrhea. No recent abx. More consistent with loose stool than watery stool. No fever or nausea/vomiting. Denies chest pain or heart failure symptoms.  BP stable in the ED. HR 90s-110s. No pauses here in the ED.  Cr 1.29.   Past Medical History:  Diagnosis Date   Anxiety    Arthritis    Bone spur    Coronary artery disease    stent - 1999   Depression    Fibrocystic breast changes    GERD (gastroesophageal reflux disease)    Glucosuria    Hernia, inguinal, left 10/2002   Hypertension    Melanoma (HCC) 10/2008   right shoulder and arm   Menorrhagia    Myocardial infarction (HCC) 1999   Past Surgical History:  Procedure Laterality Date   ANGIOPLASTY  2000   APPENDECTOMY     bone chip removed from left foot      CARDIOVERSION N/A 11/18/2023   Procedure: CARDIOVERSION;  Surgeon: Barbaraann Darryle Ned, MD;  Location: [facility] INVASIVE CV LAB;  Service: Cardiovascular;  Laterality: N/A;   CHOLECYSTECTOMY     COLONOSCOPY WITH PROPOFOL  N/A 03/03/2017   Procedure: COLONOSCOPY WITH PROPOFOL ;  Surgeon: Kristie Lamprey, MD;  Location: [facility] ENDOSCOPY;  Service: Endoscopy;  Laterality: N/A;   CORONARY ANGIOPLASTY WITH STENT PLACEMENT  10/99   ESOPHAGOGASTRODUODENOSCOPY (EGD) WITH PROPOFOL  N/A 03/03/2017   Procedure: ESOPHAGOGASTRODUODENOSCOPY (EGD) WITH PROPOFOL ;  Surgeon: Kristie Lamprey, MD;  Location: [facility] ENDOSCOPY;  Service: Endoscopy;  Laterality: N/A;   HERNIA REPAIR     umbilical    HYSTEROSCOPY  2/98   D&C (polyps)   LEFT HEART CATHETERIZATION WITH CORONARY ANGIOGRAM N/A 01/25/2014  Procedure: LEFT HEART CATHETERIZATION WITH CORONARY ANGIOGRAM;  Surgeon: Candyce GORMAN Reek, MD;  Location: [facility] CATH LAB;  Service: Cardiovascular;  Laterality: N/A;   NODE DISSECTION     neg   TOTAL ABDOMINAL HYSTERECTOMY     LSO     Failed TVH     Medications Prior to Admission: Prior to Admission medications   Medication Sig Start Date End Date Taking? Authorizing Provider  acetaminophen (TYLENOL) 650 MG CR tablet Taking 3 capsules by mouth two times daily    [provider]  apixaban  (ELIQUIS ) 5 MG TABS tablet Take 1 tablet (5 mg total) by mouth 2 (two) times daily. 11/30/23   Fenton, Clint R, PA   atorvastatin  (LIPITOR) 20 MG tablet TAKE 1 TABLET BY MOUTH DAILY AT  6 PM Patient taking differently: Take 10 mg by mouth daily. 11/03/23   Okey Vina GAILS, MD  Biotin 5 MG CAPS     [provider]  Cholecalciferol (VITAMIN D3) 125 MCG (5000 UT) CAPS Take 1 capsule by mouth 2 (two) times daily.    [provider]  diltiazem  (CARDIZEM ) 30 MG tablet Take 1 tablet every 4 hours AS NEEDED for AFIB heart rate >110 as long as top BP >100. 11/23/23   Fenton, Clint R, PA  ELIQUIS  5 MG TABS tablet TAKE 1 TABLET BY MOUTH TWICE  DAILY 10/12/23   Ross, Paula V, MD  hydrocortisone 2.5 % cream SMARTSIG:sparingly Topical Twice Daily 09/12/21   [provider]  isosorbide  mononitrate (IMDUR ) 30 MG 24 hr tablet TAKE 1 TABLET BY MOUTH DAILY 11/03/23   Okey Vina GAILS, MD  labetalol  (NORMODYNE ) 200 MG tablet Take 1 tablet (200 mg total) by mouth 2 (two) times daily. 05/29/23   Okey Vina GAILS, MD  lisinopril  (ZESTRIL ) 10 MG tablet Take 1 tablet (10 mg total) by mouth daily. 11/03/23 02/01/24  Daneen Damien BROCKS, NP  metoprolol  tartrate (LOPRESSOR ) 25 MG tablet  10/27/23   [provider]  nitroGLYCERIN  (NITROSTAT ) 0.4 MG SL tablet DISSOLVE 1 TABLET UNDER THE  TONGUE EVERY 5 MINUTES AS NEEDED FOR CHEST PAIN. MAX OF 3 TABLETS IN 15 MINUTES. CALL 911 IF PAIN  PERSISTS. 11/03/23   Monge, Damien BROCKS, NP  Soft Lens Products (REWETTING DROPS) SOLN Place 1 drop 3 (three) times daily as needed into both eyes (for dry/irritated contact lenses).    [provider]  spironolactone  (ALDACTONE ) 25 MG tablet Take 0.5 tablets (12.5 mg total) by mouth daily. 11/03/23   Okey Vina GAILS, MD    Allergies:    Allergies  Allergen Reactions   Azithromycin Swelling    Other reaction(s): swelling   Other Itching, Swelling, Other (See Comments) and Cough    Horse products Other reaction(s): reaction Other reaction(s): Unknown Other reaction(s): rash   Penicillins Swelling    Has patient had a PCN reaction causing  immediate rash, facial/tongue/throat swelling, SOB or lightheadedness with hypotension: Yes Has patient had a PCN reaction causing severe rash involving mucus membranes or skin necrosis: No Has patient had a PCN reaction that required hospitalization: No Has patient had a PCN reaction occurring within the last 10 years: No If all of the above answers are NO, then may proceed with Cephalosporin use.  Other reaction(s): rash Other reaction(s): rash   Tetanus Toxoid-Containing Vaccines Swelling    Other reaction(s): sickness   Gadolinium Derivatives Hives and Itching    Pt stated that her left arm was itching and I noticed that she had two hives  on her chest. No difficulty breathing, sneezing. One 25mg  of benadryl was ordered by Dr. Trudy. Pt remained at facility and was monitored before going home. -ldrake   Morphine And Codeine Nausea And Vomiting   Cefazolin     Other reaction(s): rash Other reaction(s): rash   Horse-Derived Products     Other reaction(s): reaction   Latex    Penicillin G     Other reaction(s): Unknown   Tessalon [Benzonatate] Other (See Comments)    hypotension   Amoxicillin-Pot Clavulanate Rash    Has patient had a PCN reaction causing immediate rash, facial/tongue/throat swelling, SOB or lightheadedness with hypotension: Yes Has patient had a PCN reaction causing severe rash involving mucus membranes or skin necrosis: Yes Has patient had a PCN reaction that required hospitalization:No Has patient had a PCN reaction occurring within the last 10 years: No If all of the above answers are NO, then may proceed with Cephalosporin use.  Other reaction(s): rash, swelling   Social History:   Social History   Socioeconomic History   Marital status: Married    Spouse name: Not on file   Number of children: Not on file   Years of education: Not on file   Highest education level: Not on file  Occupational History   Not on file  Tobacco Use   Smoking status:  Never   Smokeless tobacco: Never   Tobacco comments:    Never smoked 10/30/23  Vaping Use   Vaping status: Not on file  Substance and Sexual Activity   Alcohol use: Yes    Alcohol/week: 1.0 standard drink of alcohol    Types: 1 Standard drinks or equivalent per week    Comment: occ glass of wine   Drug use: No    Frequency: 5.0 times per week   Sexual activity: Yes    Partners: Male    Birth control/protection: Post-menopausal, Surgical    Comment: TAH/LSO  Other Topics Concern   Not on file  Social History Narrative   Not on file   Social Drivers of Health   Financial Resource Strain: Not on file  Food Insecurity: Not on file  Transportation Needs: Not on file  Physical Activity: Not on file  Stress: Not on file  Social Connections: Not on file  Intimate Partner Violence: Not on file    Family History:  The patient's family history includes Cancer in her father; Diabetes in her mother; Heart attack in her father; Heart disease in her father; Hypertension in her father and mother; Stroke in her mother.    ROS:  Please see the history of present illness.  All other ROS reviewed and negative.     Physical Exam/Data: Vitals:   01/14/24 2047 01/14/24 2117  BP: (!) 123/99 (!) 145/81  Pulse: (!) 130 (!) 105  Resp: 17 18  Temp: 98 F (36.7 C) (!) 97.4 F (36.3 C)  TempSrc:  Oral  SpO2: 98% 100%   No intake or output data in the 24 hours ending 01/14/24 2159    01/14/2024   11:09 AM 01/07/2024    3:15 PM 11/30/2023    2:39 PM  Last 3 Weights  Weight (lbs) 185 lb 9.6 oz 185 lb 188 lb 3.2 oz  Weight (kg) 84.188 kg 83.915 kg 85.367 kg     There is no height or weight on file to calculate BMI.  General:  Well nourished, well developed, in no acute distress HEENT: normal Neck: no JVD Vascular: Distal pulses 2+  bilaterally   Cardiac:  irregularly irregular, mild diastolic murmur  Lungs:  clear to auscultation bilaterally, no wheezing, rhonchi or rales  Abd: soft,  nontender, no hepatomegaly  Ext: no edema Musculoskeletal:  No deformities, BUE and BLE strength normal and equal Skin: warm and dry  Neuro:  no focal abnormalities noted Psych:  Normal affect   EKG:  The ECG that was done was personally reviewed and demonstrates afib, normal intervals  Relevant CV Studies: TTE (12/02/23) IMPRESSIONS   1. Left ventricular ejection fraction, by estimation, is 60 to 65%. The left ventricle has normal function. The left ventricle has no regional wall motion abnormalities. Left ventricular diastolic function could not be evaluated.   2. Right ventricular systolic function is normal. The right ventricular size is mildly enlarged. There is normal pulmonary artery systolic pressure. The estimated right ventricular systolic pressure is 30.0 mmHg.   3. Left atrial size was severely dilated.   4. Right atrial size was mildly dilated.   5. MVA 1.06 cm2 by continuity. The mitral valve is degenerative. Mild mitral valve regurgitation. Moderate to severe mitral stenosis. The mean mitral valve gradient is 8.7 mmHg with average heart rate of 90 bpm. Severe mitral annular calcification.   6. The aortic valve is tricuspid. Aortic valve regurgitation is not visualized. No aortic stenosis is present.   7. The inferior vena cava is normal in size with greater than 50% respiratory variability, suggesting right atrial pressure of 3 mmHg.   Comparison(s): Changes from prior study are noted. LVEF unchanged. MV  stenosis is moderate to severe.   Laboratory Data: High Sensitivity Troponin:   Recent Labs  Lab 01/14/24 2101  TROPONINIHS 7      Chemistry Recent Labs  Lab 01/14/24 2101  NA 137  K 4.1  CL 107  CO2 19*  GLUCOSE 105*  BUN 19  CREATININE 1.29*  CALCIUM  9.5  GFRNONAA 43*  ANIONGAP 11    Hematology Recent Labs  Lab 01/14/24 2101  WBC 6.0  RBC 4.31  HGB 13.6  HCT 43.2  MCV 100.2*  MCH 31.6  MCHC 31.5  RDW 12.6  PLT 227    Assessment and  Plan: AV block  Afib with RVR Cardiac syncope  The patient has had several syncopal episodes without clear prodromal symptoms. Zio patch showed nearly 10 second pause which was associated with a near syncopal episode. She has been on labetalol  200 mg bid, however, I do not think this pause was entirely related to the beta blocker. On review of the Zio patch, it looks as though she develops complete heart block as she continues to have fibrillatory p waves, and the first beat back does seem to have a slighter wider QRS. However, the fibrillatory waves may be artifact. Given this significant pause, I am concerned that she will need a permanent pacemaker. She has a history of afib with RVR so pacemaker would also allow us  to treat her afib without causing additional syncopal episodes.  - Telemetry  - EP consult in AM  - Hold labetalol  and diltiazem   - Hold AM Eliquis  dose for possible ppm  - NPO  HFpEF Moderate to severe mitral stenosis  Severely dilated LA Compensated on exam. No JVD. May be mildly dry given diarrhea. The patient has mod-severe mitral stenosis with likely subsequent left atrium severe enlargement on echo. Given degree of LA size and recent DCCV that only lasted 2 days, DCCV and/or catheter ablation may be of little utility, further evidence  that she would benefit from a ppm (to allow pharmacologic treatment of afib). - Can consider stress testing on outpatient setting to further evaluate mitral valve, however, she is asymptomatic otherwise.  CAD s/p PCI in 1999, 2002 No chest pain. No on aspirin  given remote PCI.  Htn  - Hold antihypertensives  Hld  Only on low dose statin. Unclear why not high dose. - Continue atorvastatin   - Repeat lipid panel  AKI Likely prerenal with heart block contributing. Loose stools may also be contributing.  - May trial small bolus IVF if Cr worsens with NPO status  Risk Assessment/Risk Scores:  CHA2DS2-VASc Score = 6  This indicates a  9.7% annual risk of stroke. The patient's score is based upon: CHF History: 1 HTN History: 1 Diabetes History: 0 Stroke History: 0 Vascular Disease History: 1 Age Score: 2 Gender Score: 1    Code Status: Do Not Resuscitate (DNR)  The patient states she previously has had a DNAR and would like to continue this here in the hospital. I informed her that her pauses were treatable, and she remained adamant that she wanted to be DNR. She is okay with temporary pacing if needed  Severity of Illness: The appropriate patient status for this patient is INPATIENT. Inpatient status is judged to be reasonable and necessary in order to provide the required intensity of service to ensure the patient's safety. The patient's presenting symptoms, physical exam findings, and initial radiographic and laboratory data in the context of their chronic comorbidities is felt to place them at high risk for further clinical deterioration. Furthermore, it is not anticipated that the patient will be medically stable for discharge from the hospital within 2 midnights of admission.   * I certify that at the point of admission it is my clinical judgment that the patient will require inpatient hospital care spanning beyond 2 midnights from the point of admission due to high intensity of service, high risk for further deterioration and high frequency of surveillance required.*  For questions or updates, please contact [facility] Please consult [email or web address] for contact info under     Signed, Jerrell DELENA Orchard, MD  01/14/2024 9:59 PM

## 2024-01-14 NOTE — ED Provider Notes (Signed)
 [facility] EMERGENCY DEPARTMENT AT [facility] Provider Note   CSN: [id number] Arrival date & time: 01/14/24  2043     Patient presents with: Tachycardia and Atrial Fibrillation   [name] is a 78 y.o. female.   Atrial Fibrillation   Patient is a 78 year old female with PMH of atrial fibrillation on Eliquis , CAD with PCI to LAD (1999), HTN, HLD, and recurrent syncopal episodes recently placed on a Holter monitor by cardiology today presenting to the ED after phone call from cardiologist with reported 9.8-second pause concerning for tachybradycardia syndrome -reports that she was lying in bed tonight, at which time she had a syncopal event without head trauma.  She reports mild prefaced saying sensation of peripheral vertigo/lightheadedness.  Patient reports that she has multiple similar episodes over the last week with notable ED presentation 01/07/2024 for syncopal event, with subsequent event 09/26, as well as 09/30, but denies any significant head trauma or falls associated with the latter episodes.  Patient endorses 2 days of loose stools, but denies recent headaches, vision changes, neck pain, chest pain, shortness of breath, fever, chills, cough, congestion, nausea, vomiting, abdominal pain, back pain, changes in urination.     Prior to Admission medications   Medication Sig Start Date End Date Taking? Authorizing Provider  acetaminophen (TYLENOL) 650 MG CR tablet Taking 3 capsules by mouth two times daily    [provider]  apixaban  (ELIQUIS ) 5 MG TABS tablet Take 1 tablet (5 mg total) by mouth 2 (two) times daily. 11/30/23   Fenton, Clint R, PA  atorvastatin  (LIPITOR) 20 MG tablet TAKE 1 TABLET BY MOUTH DAILY AT  6 PM Patient taking differently: Take 10 mg by mouth daily. 11/03/23   Okey Vina GAILS, MD  Biotin 5 MG CAPS     [provider]  Cholecalciferol (VITAMIN D3) 125 MCG (5000 UT) CAPS Take 1 capsule by mouth 2 (two) times daily.     [provider]  diltiazem  (CARDIZEM ) 30 MG tablet Take 1 tablet every 4 hours AS NEEDED for AFIB heart rate >110 as long as top BP >100. 11/23/23   Fenton, Clint R, PA  ELIQUIS  5 MG TABS tablet TAKE 1 TABLET BY MOUTH TWICE  DAILY 10/12/23   Ross, Paula V, MD  hydrocortisone 2.5 % cream SMARTSIG:sparingly Topical Twice Daily 09/12/21   [provider]  isosorbide  mononitrate (IMDUR ) 30 MG 24 hr tablet TAKE 1 TABLET BY MOUTH DAILY 11/03/23   Okey Vina GAILS, MD  labetalol  (NORMODYNE ) 200 MG tablet Take 1 tablet (200 mg total) by mouth 2 (two) times daily. 05/29/23   Okey Vina GAILS, MD  lisinopril  (ZESTRIL ) 10 MG tablet Take 1 tablet (10 mg total) by mouth daily. 11/03/23 02/01/24  Daneen Damien BROCKS, NP  metoprolol  tartrate (LOPRESSOR ) 25 MG tablet  10/27/23   [provider]  nitroGLYCERIN  (NITROSTAT ) 0.4 MG SL tablet DISSOLVE 1 TABLET UNDER THE  TONGUE EVERY 5 MINUTES AS NEEDED FOR CHEST PAIN. MAX OF 3 TABLETS IN 15 MINUTES. CALL 911 IF PAIN  PERSISTS. 11/03/23   Monge, Damien BROCKS, NP  Soft Lens Products (REWETTING DROPS) SOLN Place 1 drop 3 (three) times daily as needed into both eyes (for dry/irritated contact lenses).    [provider]  spironolactone  (ALDACTONE ) 25 MG tablet Take 0.5 tablets (12.5 mg total) by mouth daily. 11/03/23   Okey Vina GAILS, MD    Allergies: Azithromycin, Other, Penicillins, Tetanus toxoid-containing vaccines, Gadolinium derivatives, Morphine and codeine, Cefazolin, Horse-derived products,  Latex, Penicillin g, Tessalon [benzonatate], and Amoxicillin-pot clavulanate    Review of Systems  Updated Vital Signs BP 123/82   Pulse 98   Temp (!) 97.4 F (36.3 C) (Oral)   Resp 17   SpO2 100%   Physical Exam Constitutional:      Appearance: Normal appearance.  HENT:     Head: Normocephalic and atraumatic.     Right Ear: Ear canal and external ear normal.     Left Ear: Ear canal and external ear normal.     Nose: Nose normal.     Mouth/Throat:      Mouth: Mucous membranes are moist.     Pharynx: Oropharynx is clear.  Eyes:     Extraocular Movements: Extraocular movements intact.     Conjunctiva/sclera: Conjunctivae normal.     Pupils: Pupils are equal, round, and reactive to light.  Cardiovascular:     Rate and Rhythm: Tachycardia present. Rhythm irregular.     Pulses: Normal pulses.  Pulmonary:     Effort: Pulmonary effort is normal.     Breath sounds: Normal breath sounds.  Abdominal:     General: Abdomen is flat.     Palpations: Abdomen is soft.  Musculoskeletal:        General: Normal range of motion.     Cervical back: Normal range of motion.  Skin:    General: Skin is warm and dry.     Capillary Refill: Capillary refill takes less than 2 seconds.  Neurological:     General: No focal deficit present.     Mental Status: She is alert and oriented to person, place, and time.     (all labs ordered are listed, but only abnormal results are displayed) Labs Reviewed  BASIC METABOLIC PANEL WITH GFR - Abnormal; Notable for the following components:      Result Value   CO2 19 (*)    Glucose, Bld 105 (*)    Creatinine, Ser 1.29 (*)    GFR, Estimated 43 (*)    All other components within normal limits  CBC - Abnormal; Notable for the following components:   MCV 100.2 (*)    All other components within normal limits  MAGNESIUM  - Abnormal; Notable for the following components:   Magnesium  1.5 (*)    All other components within normal limits  PHOSPHORUS  TROPONIN I (HIGH SENSITIVITY)  TROPONIN I (HIGH SENSITIVITY)    EKG: None  Radiology: DG Chest 2 View Result Date: 01/14/2024 EXAM: 2 VIEW(S) XRAY OF THE CHEST 01/14/2024 10:06:28 PM COMPARISON: 01/07/2024 CLINICAL HISTORY: CP. Chest pain FINDINGS: LINES, TUBES AND DEVICES: Electronic device in left mid to lower lung fields. LUNGS AND PLEURA: Low lung volumes. Suspected retrocardiac airspace opacity may be due to atelectasis or pneumonia. No pulmonary edema. No pleural  effusion. No pneumothorax. HEART AND MEDIASTINUM: Cardiomegaly. Mitral valve annulus calcification. Aortic atherosclerosis. BONES AND SOFT TISSUES: Multilevel thoracic osteophytosis. IMPRESSION: 1. Suspected retrocardiac airspace opacity, possibly due to atelectasis or pneumonia. Electronically signed by: Norman Gatlin MD 01/14/2024 10:16 PM EDT RP Workstation: [id number]     Procedures   Medications Ordered in the ED  magnesium  sulfate IVPB 2 g 50 mL (2 g Intravenous New Bag/Given 01/14/24 2223)                                    Medical Decision Making Amount and/or Complexity of Data Reviewed Labs: ordered. Radiology: ordered.  Risk Prescription drug management. Decision regarding hospitalization.  78 year old female with PMH of recurrent cardiogenic syncopal episodes recently placed on Holter monitor earlier today in the setting of recurrent syncopal episodes x 1 week presenting to the ED as referred from cardiology today with concern for tachybradycardia syndrome in the setting of 9.8-second pause associated with syncopal episode while patient was lying in bed.  No head trauma.  Patient denying chest pain or recent anginal complaints.  Upon arrival, patient well-appearing in no acute distress.  Normotensive.  Afebrile.  Irregularly irregular 90-130 bpm.  No tachypnea.  Saturating 98% RA.  Mentating appropriately.  Patient warm and well-perfused with brisk capillary refill.  Bedside US  indicative of irregularly irregular heart rhythm with no focal wall motion abnormality.  Left greater than right atrial chamber dilation noted.  Qualitatively preserved systolic function.  In the setting of potential need for EP evaluation, cardiology team consulted with plan to evaluate patient at bedside.  Phosphorus 3.9.  Troponin 7-WNL.  BMP with stable renal function and no acute electrolytic derangement.  Hypomagnesemia noted with Mg 1.5.  Orders placed for 2 g IV mag replacement.  Patient to be  admitted to cardiology for ongoing medical management.  Final diagnoses:  Sinus pause  Atrial fibrillation with RVR [facility])  Hypomagnesemia    ED Discharge Orders     None          Dorcus Fallow, MD 01/14/24 2238    Patt Alm Macho, MD 01/14/24 [phone]

## 2024-01-15 ENCOUNTER — Encounter (HOSPITAL_COMMUNITY): Payer: Self-pay

## 2024-01-15 ENCOUNTER — Other Ambulatory Visit (HOSPITAL_COMMUNITY): Payer: Self-pay

## 2024-01-15 ENCOUNTER — Other Ambulatory Visit: Payer: Self-pay

## 2024-01-15 ENCOUNTER — Telehealth: Payer: Self-pay | Admitting: Pharmacy Technician

## 2024-01-15 ENCOUNTER — Telehealth (HOSPITAL_COMMUNITY): Payer: Self-pay

## 2024-01-15 DIAGNOSIS — R55 Syncope and collapse: Secondary | ICD-10-CM

## 2024-01-15 DIAGNOSIS — I4819 Other persistent atrial fibrillation: Secondary | ICD-10-CM

## 2024-01-15 DIAGNOSIS — I4891 Unspecified atrial fibrillation: Secondary | ICD-10-CM

## 2024-01-15 DIAGNOSIS — I495 Sick sinus syndrome: Principal | ICD-10-CM

## 2024-01-15 LAB — BASIC METABOLIC PANEL WITH GFR
Anion gap: 11 (ref 5–15)
BUN: 20 mg/dL (ref 8–23)
CO2: 19 mmol/L — ABNORMAL LOW (ref 22–32)
Calcium: 9.2 mg/dL (ref 8.9–10.3)
Chloride: 108 mmol/L (ref 98–111)
Creatinine, Ser: 1.19 mg/dL — ABNORMAL HIGH (ref 0.44–1.00)
GFR, Estimated: 47 mL/min — ABNORMAL LOW (ref >60)
Glucose, Bld: 95 mg/dL (ref 70–99)
Potassium: 4 mmol/L (ref 3.5–5.1)
Sodium: 138 mmol/L (ref 135–145)

## 2024-01-15 LAB — LIPID PANEL
Cholesterol: 105 mg/dL (ref 0–200)
HDL: 51 mg/dL (ref >40)
LDL Cholesterol: 39 mg/dL (ref 0–99)
Total CHOL/HDL Ratio: 2.1 ratio
Triglycerides: 76 mg/dL (ref <150)
VLDL: 15 mg/dL (ref 0–40)

## 2024-01-15 LAB — HEPATIC FUNCTION PANEL
ALT: 19 U/L (ref 0–44)
AST: 19 U/L (ref 15–41)
Albumin: 3.9 g/dL (ref 3.5–5.0)
Alkaline Phosphatase: 63 U/L (ref 38–126)
Bilirubin, Direct: 0.3 mg/dL — ABNORMAL HIGH (ref 0.0–0.2)
Indirect Bilirubin: 1.1 mg/dL — ABNORMAL HIGH (ref 0.3–0.9)
Total Bilirubin: 1.4 mg/dL — ABNORMAL HIGH (ref 0.0–1.2)
Total Protein: 6.3 g/dL — ABNORMAL LOW (ref 6.5–8.1)

## 2024-01-15 LAB — CBC
HCT: 38.8 % (ref 36.0–46.0)
Hemoglobin: 12.2 g/dL (ref 12.0–15.0)
MCH: 31.7 pg (ref 26.0–34.0)
MCHC: 31.4 g/dL (ref 30.0–36.0)
MCV: 100.8 fL — ABNORMAL HIGH (ref 80.0–100.0)
Platelets: 199 K/uL (ref 150–400)
RBC: 3.85 MIL/uL — ABNORMAL LOW (ref 3.87–5.11)
RDW: 12.7 % (ref 11.5–15.5)
WBC: 6.6 K/uL (ref 4.0–10.5)
nRBC: 0 % (ref 0.0–0.2)

## 2024-01-15 LAB — APTT: aPTT: 93 s — ABNORMAL HIGH (ref 24–36)

## 2024-01-15 LAB — SURGICAL PCR SCREEN
MRSA, PCR: NEGATIVE
Staphylococcus aureus: NEGATIVE

## 2024-01-15 LAB — PROTIME-INR
INR: 1.4 — ABNORMAL HIGH (ref 0.8–1.2)
Prothrombin Time: 17.4 s — ABNORMAL HIGH (ref 11.4–15.2)

## 2024-01-15 LAB — TROPONIN I (HIGH SENSITIVITY): Troponin I (High Sensitivity): 8 ng/L (ref <18)

## 2024-01-15 LAB — T4, FREE: Free T4: 1.3 ng/dL — ABNORMAL HIGH (ref 0.61–1.12)

## 2024-01-15 LAB — TSH: TSH: 1.232 u[IU]/mL (ref 0.350–4.500)

## 2024-01-15 LAB — MAGNESIUM: Magnesium: 2.2 mg/dL (ref 1.7–2.4)

## 2024-01-15 MED ORDER — SODIUM CHLORIDE 0.9% FLUSH
3.0000 mL | Freq: Two times a day (BID) | INTRAVENOUS | Status: DC
  Administered 2024-01-15 – 2024-01-19 (×3): 3 mL via INTRAVENOUS

## 2024-01-15 MED ORDER — HEPARIN (PORCINE) 25000 UT/250ML-% IV SOLN
950.0000 [IU]/h | INTRAVENOUS | Status: AC
  Administered 2024-01-15: 1200 [IU]/h via INTRAVENOUS
  Administered 2024-01-16: 1050 [IU]/h via INTRAVENOUS
  Administered 2024-01-17: 950 [IU]/h via INTRAVENOUS
  Filled 2024-01-15 (×4): qty 250

## 2024-01-15 MED ORDER — INFLUENZA VAC SPLIT HIGH-DOSE 0.5 ML IM SUSY
0.5000 mL | PREFILLED_SYRINGE | INTRAMUSCULAR | Status: DC
  Filled 2024-01-15: qty 0.5

## 2024-01-15 MED ORDER — ELIQUIS 5 MG PO TABS
5.0000 mg | ORAL_TABLET | Freq: Two times a day (BID) | ORAL | 2 refills | Status: AC
  Filled 2024-01-15: qty 200, 100d supply, fill #0
  Filled 2024-02-26 – 2024-04-22 (×2): qty 200, 100d supply, fill #1

## 2024-01-15 NOTE — Addendum Note (Signed)
 Addended by: BLUFORD RAMP D on: 01/15/2024 02:11 PM   Modules accepted: Orders

## 2024-01-15 NOTE — Progress Notes (Signed)
 PHARMACY - ANTICOAGULATION CONSULT NOTE  Pharmacy Consult for heparin  Indication: atrial fibrillation  Allergies  Allergen Reactions   Azithromycin Swelling    Other reaction(s): swelling   Other Itching, Swelling, Other (See Comments) and Cough    Horse products Other reaction(s): reaction Other reaction(s): Unknown Other reaction(s): rash   Penicillins Swelling    Has patient had a PCN reaction causing immediate rash, facial/tongue/throat swelling, SOB or lightheadedness with hypotension: Yes Has patient had a PCN reaction causing severe rash involving mucus membranes or skin necrosis: No Has patient had a PCN reaction that required hospitalization: No Has patient had a PCN reaction occurring within the last 10 years: No If all of the above answers are NO, then may proceed with Cephalosporin use.  Other reaction(s): rash Other reaction(s): rash   Tetanus Toxoid-Containing Vaccines Swelling    Other reaction(s): sickness   Gadolinium Derivatives Hives and Itching    Pt stated that her left arm was itching and I noticed that she had two hives on her chest. No difficulty breathing, sneezing. One 25mg  of benadryl was ordered by Dr. Trudy. Pt remained at facility and was monitored before going home. -ldrake   Morphine And Codeine Nausea And Vomiting   Cefazolin     Other reaction(s): rash Other reaction(s): rash   Horse-Derived Products     Other reaction(s): reaction   Latex    Penicillin G     Other reaction(s): Unknown   Tessalon [Benzonatate] Other (See Comments)    hypotension   Amoxicillin-Pot Clavulanate Rash    Has patient had a PCN reaction causing immediate rash, facial/tongue/throat swelling, SOB or lightheadedness with hypotension: Yes Has patient had a PCN reaction causing severe rash involving mucus membranes or skin necrosis: Yes Has patient had a PCN reaction that required hospitalization:No Has patient had a PCN reaction occurring within the last 10 years:  No If all of the above answers are NO, then may proceed with Cephalosporin use.  Other reaction(s): rash, swelling    Patient Measurements: Height: 5' 2 (157.5 cm) Weight: 84 kg (185 lb 3 oz) IBW/kg (Calculated) : 50.1 HEPARIN  DW (KG): 69  Vital Signs: Temp: 97.8 F (36.6 C) (10/03 1943) Temp Source: Oral (10/03 1943) BP: 122/73 (10/03 1943) Pulse Rate: 119 (10/03 1943)  Labs: Recent Labs    01/14/24 2101 01/15/24 0044 01/15/24 2105  HGB 13.6 12.2  --   HCT 43.2 38.8  --   PLT 227 199  --   APTT  --   --  93*  LABPROT  --  17.4*  --   INR  --  1.4*  --   CREATININE 1.29* 1.19*  --   TROPONINIHS 7 8  --     Estimated Creatinine Clearance: 39.8 mL/min (A) (by C-G formula based on SCr of 1.19 mg/dL (H)).   Medical History: Past Medical History:  Diagnosis Date   Anxiety    Arthritis    Bone spur    Coronary artery disease    stent - 1999   Depression    Fibrocystic breast changes    GERD (gastroesophageal reflux disease)    Glucosuria    Hernia, inguinal, left 10/2002   Hypertension    Melanoma (HCC) 10/2008   right shoulder and arm   Menorrhagia    Myocardial infarction (HCC) 1999     Assessment: 49 yoF with hx AFib on apixaban  PTA admitted with syncope / pauses. Pt likely will need PPM next week, pharmacy to dose IV  heparin . Last apixaban  dose 10/2 pm.  -aPTT 93 and at goal on heparin  1200 units/hr  Goal of Therapy:  Heparin  level 0.3-0.7 units/ml aPTT 66-102 seconds Monitor platelets by anticoagulation protocol: Yes   Plan:  -Continue heparin  1200 units/h - stop at 2359 10/5 for ICD -Daily aPTT, heparin  level, CBC  Prentice Poisson, PharmD Clinical Pharmacist **Pharmacist phone directory can now be found on [email or web address] ([email or web address]).  Listed under [facility].

## 2024-01-15 NOTE — TOC CM/SW Note (Signed)
 Transition of Care [facility]) - Inpatient Brief Assessment   Patient Details  Name: [name] MRN: [id number] Date of Birth: July 21, 1945  Transition of Care [facility]) CM/SW Contact:    Sudie Erminio Deems, RN Phone Number: 01/15/2024, 11:04 AM   Clinical Narrative: Patient presented for syncope. EP is following for Afib tachy/brady-plan for PPM. PTA patient was from home with spouse. Patient has DME: cane and rolling walker; however, she does not use them in the home. Inpatient Case Manager will continue to follow for additional disposition needs.    Transition of Care Asessment: Insurance and Status: Insurance coverage has been reviewed Patient has primary care physician: Yes Home environment has been reviewed: reviewed Prior level of function:: indpendent Prior/Current Home Services: No current home services Social Drivers of Health Review: SDOH reviewed no interventions necessary Readmission risk has been reviewed: Yes Transition of care needs: no transition of care needs at this time

## 2024-01-15 NOTE — Telephone Encounter (Signed)
 Prescription refill request for Eliquis  received. Indication:afib Last office visit:10/25 Scr:1.19  10/25 Age: 78 Weight:84  kg  Prescription refilled

## 2024-01-15 NOTE — Telephone Encounter (Signed)
 Hi, the patient would like the prescription for eliquis  sent to our [facility] [facility] pharmacy for mail. They have the grant information already. Thank you!   Patient Advocate Encounter   The patient was approved for a [employer] grant that will help cover the cost of ELIQUIS  Total amount awarded, 7500.  Effective: 12/16/23 - 12/14/24   APW:[id number] ERW:[id number] Hmnle:[id number] PI:[id number] [employer] ID: [id number]   Pharmacy provided with approval and processing information. Patient informed via telephone/mychart

## 2024-01-15 NOTE — Progress Notes (Addendum)
 PHARMACY - ANTICOAGULATION CONSULT NOTE  Pharmacy Consult for heparin  Indication: atrial fibrillation  Allergies  Allergen Reactions   Azithromycin Swelling    Other reaction(s): swelling   Other Itching, Swelling, Other (See Comments) and Cough    Horse products Other reaction(s): reaction Other reaction(s): Unknown Other reaction(s): rash   Penicillins Swelling    Has patient had a PCN reaction causing immediate rash, facial/tongue/throat swelling, SOB or lightheadedness with hypotension: Yes Has patient had a PCN reaction causing severe rash involving mucus membranes or skin necrosis: No Has patient had a PCN reaction that required hospitalization: No Has patient had a PCN reaction occurring within the last 10 years: No If all of the above answers are NO, then may proceed with Cephalosporin use.  Other reaction(s): rash Other reaction(s): rash   Tetanus Toxoid-Containing Vaccines Swelling    Other reaction(s): sickness   Gadolinium Derivatives Hives and Itching    Pt stated that her left arm was itching and I noticed that she had two hives on her chest. No difficulty breathing, sneezing. One 25mg  of benadryl was ordered by Dr. Trudy. Pt remained at facility and was monitored before going home. -ldrake   Morphine And Codeine Nausea And Vomiting   Cefazolin     Other reaction(s): rash Other reaction(s): rash   Horse-Derived Products     Other reaction(s): reaction   Latex    Penicillin G     Other reaction(s): Unknown   Tessalon [Benzonatate] Other (See Comments)    hypotension   Amoxicillin-Pot Clavulanate Rash    Has patient had a PCN reaction causing immediate rash, facial/tongue/throat swelling, SOB or lightheadedness with hypotension: Yes Has patient had a PCN reaction causing severe rash involving mucus membranes or skin necrosis: Yes Has patient had a PCN reaction that required hospitalization:No Has patient had a PCN reaction occurring within the last 10 years:  No If all of the above answers are NO, then may proceed with Cephalosporin use.  Other reaction(s): rash, swelling    Patient Measurements: Height: 5' 2 (157.5 cm) Weight: 84 kg (185 lb 3 oz) IBW/kg (Calculated) : 50.1 HEPARIN  DW (KG): 69  Vital Signs: Temp: 98.1 F (36.7 C) (10/03 0750) Temp Source: Oral (10/03 0750) BP: 141/78 (10/03 0750) Pulse Rate: 94 (10/03 0750)  Labs: Recent Labs    01/14/24 2101 01/15/24 0044  HGB 13.6 12.2  HCT 43.2 38.8  PLT 227 199  LABPROT  --  17.4*  INR  --  1.4*  CREATININE 1.29* 1.19*  TROPONINIHS 7 8    Estimated Creatinine Clearance: 39.8 mL/min (A) (by C-G formula based on SCr of 1.19 mg/dL (H)).   Medical History: Past Medical History:  Diagnosis Date   Anxiety    Arthritis    Bone spur    Coronary artery disease    stent - 1999   Depression    Fibrocystic breast changes    GERD (gastroesophageal reflux disease)    Glucosuria    Hernia, inguinal, left 10/2002   Hypertension    Melanoma (HCC) 10/2008   right shoulder and arm   Menorrhagia    Myocardial infarction (HCC) 1999     Assessment: 3 yoF with hx AFib on apixaban  PTA admitted with syncope / pauses. Pt likely will need PPM next week, pharmacy to dose IV heparin . Last apixaban  dose 10/2 pm.  Goal of Therapy:  Heparin  level 0.3-0.7 units/ml aPTT 66-102 seconds Monitor platelets by anticoagulation protocol: Yes   Plan:  Heparin  1200 units/h no  bolus - stop at 2359 10/5 for ICD Check aPTT in 8h Daily aPTT, heparin  level, CBC  Ozell Jamaica, PharmD, BCPS, [facility] Clinical Pharmacist [phone] Please check AMION for all [facility] Pharmacy numbers 01/15/2024

## 2024-01-15 NOTE — Telephone Encounter (Signed)
 Patient given samples of Eliquis  5mg  tablet Lot # [id number] Exp: 11/11/2024 Quantity: 56 tablets Manufacturer: Pfizer Date given: 01/14/2024 [name]- PA

## 2024-01-15 NOTE — Consult Note (Signed)
 Cardiology Consultation   Patient ID: [name] MRN: [id number]; DOB: [birth year]  Admit date: 01/14/2024 Date of Consult: 01/15/2024  PCP:  Dayna Motto, DO   [facility] Providers Cardiologist:  Vina Gull, MD     Patient Profile: [name] is a 78 y.o. female with a hx of  HTN, HLD Esopgageal stricture/GERD CAD (PCI LAD 1999, 2002 CHF (diastolic) AFib   who is being seen 01/15/2024 for the evaluation of syncope/pauses at the request of Dr. Floretta.  AFib looks to go back to 2020 EKG 10/27/23, appears an atypical AFlutter, 10/30/23 looks AFib Mostly appears AFib/coarse AFib  History of Present Illness: [name] carries a diagnosis of Afib, July this year, back out of rhythm, referred to AFib clinic for evaluation/recommendations for management. Initial management > DCCV, planned though delayed with patient concerns of proceeding Ultimately DCCV 11/18/23 > SR She ad ERAF (suspected only about 2 days of SR) In d/w AFib clinic planned to move forward with AAD (perhaps Multaq), though had missed OAC dose (s) and held off. She suffered a syncopal event 9/24 after standing up. Though reported recurrent syncopal episodes At her visit 10/2, some lower BP noted after having used PRN dilt dose. Planned to hold off further med changes and get monitoring done  Last night monitoring complany reached out with prolonged pause of 9.8 seconds, in d/w patient + symptoms, dizziness 9was supine at the time w/o syncope) Recommended that she proceed to the ER  Home rate/rhythm meds: Dilt 30mg  PRN Labetalol  200mg  BID Metoprolol  is listed without a dose/unclear, does not appear to be actively taking  LABS K+ 4.1 > 4.0 Mag 1.5 > 2.2 BUN/Creat 19/1.29 > 1.19 HS Trops 7, 8 WBC 6.6 H/H 12/38 Plts 199  Monitor images in media reviewed: AFib CVR 9.8 second pause with brief recovered AV conduction followed by 6 second pause There were both AFib > to > Afib, not  post conversion pauses  In the last 2-3 weeks she has suffered a number of syncopal episodes  --- occurred as she stood up > lightheaded > fainted ~ 12 seconds by her husbands estimate > hit her head, had an ED visit with no injuries ----while in bed awake she started to feel very lightheaded, and thinks she fainted a few seconds --- in the bathroom, standing started to feel poorly, called out to her husband > fainted, falling on her nuttocks --- laying in bed again, awake, started to feel swimmy headed > had several recurrent fainting spells, 3-5 --- yesterday again in bed, reading, became very lightheaded, did not faint, pushed the button on the monitor and fairly shortly afterwards called by the service  When in AFib generally not as much energy, doesn't feel as well, otherwise she feels quite well Random fleeting sharp left CP, none otherwise No SOB   Past Medical History:  Diagnosis Date   Anxiety    Arthritis    Bone spur    Coronary artery disease    stent - 1999   Depression    Fibrocystic breast changes    GERD (gastroesophageal reflux disease)    Glucosuria    Hernia, inguinal, left 10/2002   Hypertension    Melanoma (HCC) 10/2008   right shoulder and arm   Menorrhagia    Myocardial infarction (HCC) 1999    Past Surgical History:  Procedure Laterality Date   ANGIOPLASTY  2000   APPENDECTOMY     bone chip removed from left foot  CARDIOVERSION N/A 11/18/2023   Procedure: CARDIOVERSION;  Surgeon: Barbaraann Darryle Ned, MD;  Location: [facility] INVASIVE CV LAB;  Service: Cardiovascular;  Laterality: N/A;   CHOLECYSTECTOMY     COLONOSCOPY WITH PROPOFOL  N/A 03/03/2017   Procedure: COLONOSCOPY WITH PROPOFOL ;  Surgeon: Kristie Lamprey, MD;  Location: [facility] ENDOSCOPY;  Service: Endoscopy;  Laterality: N/A;   CORONARY ANGIOPLASTY WITH STENT PLACEMENT  10/99   ESOPHAGOGASTRODUODENOSCOPY (EGD) WITH PROPOFOL  N/A 03/03/2017   Procedure: ESOPHAGOGASTRODUODENOSCOPY (EGD) WITH PROPOFOL ;   Surgeon: Kristie Lamprey, MD;  Location: [facility] ENDOSCOPY;  Service: Endoscopy;  Laterality: N/A;   HERNIA REPAIR     umbilical    HYSTEROSCOPY  2/98   D&C (polyps)   LEFT HEART CATHETERIZATION WITH CORONARY ANGIOGRAM N/A 01/25/2014   Procedure: LEFT HEART CATHETERIZATION WITH CORONARY ANGIOGRAM;  Surgeon: Candyce GORMAN Reek, MD;  Location: [facility] CATH LAB;  Service: Cardiovascular;  Laterality: N/A;   NODE DISSECTION     neg   TOTAL ABDOMINAL HYSTERECTOMY     LSO     Failed TVH      Scheduled Meds:  atorvastatin   10 mg Oral Daily   [START ON 01/16/2024] Influenza vac split trivalent PF  0.5 mL Intramuscular Tomorrow-1000   Continuous Infusions:  PRN Meds: acetaminophen, ondansetron  (ZOFRAN ) IV  Allergies:    Allergies  Allergen Reactions   Azithromycin Swelling    Other reaction(s): swelling   Other Itching, Swelling, Other (See Comments) and Cough    Horse products Other reaction(s): reaction Other reaction(s): Unknown Other reaction(s): rash   Penicillins Swelling    Has patient had a PCN reaction causing immediate rash, facial/tongue/throat swelling, SOB or lightheadedness with hypotension: Yes Has patient had a PCN reaction causing severe rash involving mucus membranes or skin necrosis: No Has patient had a PCN reaction that required hospitalization: No Has patient had a PCN reaction occurring within the last 10 years: No If all of the above answers are NO, then may proceed with Cephalosporin use.  Other reaction(s): rash Other reaction(s): rash   Tetanus Toxoid-Containing Vaccines Swelling    Other reaction(s): sickness   Gadolinium Derivatives Hives and Itching    Pt stated that her left arm was itching and I noticed that she had two hives on her chest. No difficulty breathing, sneezing. One 25mg  of benadryl was ordered by Dr. Trudy. Pt remained at facility and was monitored before going home. -ldrake   Morphine And Codeine Nausea And Vomiting   Cefazolin     Other  reaction(s): rash Other reaction(s): rash   Horse-Derived Products     Other reaction(s): reaction   Latex    Penicillin G     Other reaction(s): Unknown   Tessalon [Benzonatate] Other (See Comments)    hypotension   Amoxicillin-Pot Clavulanate Rash    Has patient had a PCN reaction causing immediate rash, facial/tongue/throat swelling, SOB or lightheadedness with hypotension: Yes Has patient had a PCN reaction causing severe rash involving mucus membranes or skin necrosis: Yes Has patient had a PCN reaction that required hospitalization:No Has patient had a PCN reaction occurring within the last 10 years: No If all of the above answers are NO, then may proceed with Cephalosporin use.  Other reaction(s): rash, swelling    Social History:   Social History   Socioeconomic History   Marital status: Married    Spouse name: Not on file   Number of children: Not on file   Years of education: Not on file   Highest education level: Not on  file  Occupational History   Not on file  Tobacco Use   Smoking status: Never   Smokeless tobacco: Never   Tobacco comments:    Never smoked 10/30/23  Vaping Use   Vaping status: Not on file  Substance and Sexual Activity   Alcohol use: Yes    Alcohol/week: 1.0 standard drink of alcohol    Types: 1 Standard drinks or equivalent per week    Comment: occ glass of wine   Drug use: No    Frequency: 5.0 times per week   Sexual activity: Yes    Partners: Male    Birth control/protection: Post-menopausal, Surgical    Comment: TAH/LSO  Other Topics Concern   Not on file  Social History Narrative   Not on file   Social Drivers of Health   Financial Resource Strain: Not on file  Food Insecurity: No Food Insecurity (01/15/2024)   Hunger Vital Sign    Worried About Running Out of Food in the Last Year: Never true    Ran Out of Food in the Last Year: Never true  Transportation Needs: No Transportation Needs (01/15/2024)   PRAPARE -  Administrator, Civil Service (Medical): No    Lack of Transportation (Non-Medical): No  Physical Activity: Not on file  Stress: Not on file  Social Connections: Unknown (01/15/2024)   Social Connection and Isolation Panel    Frequency of Communication with Friends and Family: Never    Frequency of Social Gatherings with Friends and Family: Never    Attends Religious Services: Patient declined    Database administrator or Organizations: Patient declined    Attends Banker Meetings: Patient declined    Marital Status: Patient declined  Intimate Partner Violence: Not At Risk (01/15/2024)   Humiliation, Afraid, Rape, and Kick questionnaire    Fear of Current or Ex-Partner: No    Emotionally Abused: No    Physically Abused: No    Sexually Abused: No    Family History:   Family History  Problem Relation Age of Onset   Diabetes Mother    Stroke Mother    Hypertension Mother    Heart disease Father    Hypertension Father    Cancer Father        pancreatic cancer   Heart attack Father      ROS:  Please see the history of present illness.  All other ROS reviewed and negative.     Physical Exam/Data: Vitals:   01/15/24 0102 01/15/24 0232 01/15/24 0516 01/15/24 0750  BP: 130/76 112/63 119/70 (!) 141/78  Pulse: 63 82  94  Resp: 18 18 18 18   Temp: 97.8 F (36.6 C) 97.9 F (36.6 C) 98.1 F (36.7 C) 98.1 F (36.7 C)  TempSrc: Oral Oral Oral Oral  SpO2: 97% 95%  98%  Weight:      Height:        Intake/Output Summary (Last 24 hours) at 01/15/2024 0919 Last data filed at 01/15/2024 0500 Gross per 24 hour  Intake 120 ml  Output --  Net 120 ml      01/15/2024    1:00 AM 01/14/2024   11:09 AM 01/07/2024    3:15 PM  Last 3 Weights  Weight (lbs) 185 lb 3 oz 185 lb 9.6 oz 185 lb  Weight (kg) 84 kg 84.188 kg 83.915 kg     Body mass index is 33.87 kg/m.  General:  Well nourished, well developed, in no acute distress  HEENT: normal Neck: no  JVD Vascular: No carotid bruits; Distal pulses 2+ bilaterally Cardiac:  irreg-irreg; 2/6 DM Lungs:  CTA b/l, no wheezing, rhonchi or rales  Abd: soft, nontender, no hepatomegaly  Ext: no edema Musculoskeletal:  No deformities Skin: warm and dry  Neuro:  no focal abnormalities noted Psych:  Normal affect   EKG:  The EKG was personally reviewed and demonstrates:    Coarse AFib 118bpm  OLD: 11/18/23: SR 73bpm, 1st degree AVblock  Telemetry:  Telemetry was personally reviewed and demonstrates:    AFib 80's mostly some transient rates towards 140's  Relevant CV Studies:  12/02/23: TTE 1. Left ventricular ejection fraction, by estimation, is 60 to 65%. The  left ventricle has normal function. The left ventricle has no regional  wall motion abnormalities. Left ventricular diastolic function could not  be evaluated.   2. Right ventricular systolic function is normal. The right ventricular  size is mildly enlarged. There is normal pulmonary artery systolic  pressure. The estimated right ventricular systolic pressure is 30.0 mmHg.   3. Left atrial size was severely dilated.   4. Right atrial size was mildly dilated.   5. MVA 1.06 cm2 by continuity. The mitral valve is degenerative. Mild  mitral valve regurgitation. Moderate to severe mitral stenosis. The mean  mitral valve gradient is 8.7 mmHg with average heart rate of 90 bpm.  Severe mitral annular calcification.   6. The aortic valve is tricuspid. Aortic valve regurgitation is not  visualized. No aortic stenosis is present.   7. The inferior vena cava is normal in size with greater than 50%  respiratory variability, suggesting right atrial pressure of 3 mmHg.   Comparison(s): Changes from prior study are noted. LVEF unchanged. MV  stenosis is moderate to severe.    04/17/21: TTE IMPRESSIONS 1. Left ventricular ejection fraction, by estimation, is 60 to 65%. The left ventricle has normal function. The left ventricle has no  regional wall motion abnormalities. Left ventricular diastolic parameters are consistent with Grade I diastolic dysfunction (impaired relaxation). Elevated left atrial pressure. 2. Right ventricular systolic function is normal. The right ventricular size is normal. 3. Left atrial size was severely dilated. 4. Right atrial size was mildly dilated. 5. The mitral valve is normal in structure. Mild mitral valve regurgitation. No evidence of mitral stenosis. Severe mitral annular calcification. 6. The aortic valve is normal in structure. Aortic valve regurgitation is not visualized. No aortic stenosis is present. 7. The inferior vena cava is normal in size with greater than 50% respiratory variability, suggesting right atrial pressure of 3 mmHg.    MYOCARDIAL PERFUSION IMAGING 04/11/2021 Interpretation Summary   The study is normal. The study is low risk.   No ST deviation was noted.   LV perfusion is normal. There is no evidence of ischemia. There is no evidence of infarction.   Left ventricular function is normal. Nuclear stress EF: 71 %. The left ventricular ejection fraction is hyperdynamic (>65%). End diastolic cavity size is normal. End systolic cavity size is normal.   Prior study available for comparison from 11/04/2011. No changes compared to prior study. Normal perfusion. LVEF 86%   Normal perfusion. LVEF 71% with normal wall motion. This is a low risk study. No change compared to prior study in 2013.  Laboratory Data: High Sensitivity Troponin:   Recent Labs  Lab 01/14/24 2101 01/15/24 0044  TROPONINIHS 7 8     Chemistry Recent Labs  Lab 01/14/24 2101 01/15/24 0044  NA  137 138  K 4.1 4.0  CL 107 108  CO2 19* 19*  GLUCOSE 105* 95  BUN 19 20  CREATININE 1.29* 1.19*  CALCIUM  9.5 9.2  MG 1.5* 2.2  GFRNONAA 43* 47*  ANIONGAP 11 11    Recent Labs  Lab 01/15/24 0044  PROT 6.3*  ALBUMIN 3.9  AST 19  ALT 19  ALKPHOS 63  BILITOT 1.4*   Lipids  Recent Labs  Lab  01/15/24 0044  CHOL 105  TRIG 76  HDL 51  LDLCALC 39  CHOLHDL 2.1    Hematology Recent Labs  Lab 01/14/24 2101 01/15/24 0044  WBC 6.0 6.6  RBC 4.31 3.85*  HGB 13.6 12.2  HCT 43.2 38.8  MCV 100.2* 100.8*  MCH 31.6 31.7  MCHC 31.5 31.4  RDW 12.6 12.7  PLT 227 199   Thyroid  Recent Labs  Lab 01/15/24 0044  TSH 1.232  FREET4 1.30*    BNPNo results for input(s): BNP, PROBNP in the last 168 hours.  DDimer No results for input(s): DDIMER in the last 168 hours.  Radiology/Studies:   DG Chest 2 View Result Date: 01/14/2024 EXAM: 2 VIEW(S) XRAY OF THE CHEST 01/14/2024 10:06:28 PM COMPARISON: 01/07/2024 CLINICAL HISTORY: CP. Chest pain FINDINGS: LINES, TUBES AND DEVICES: Electronic device in left mid to lower lung fields. LUNGS AND PLEURA: Low lung volumes. Suspected retrocardiac airspace opacity may be due to atelectasis or pneumonia. No pulmonary edema. No pleural effusion. No pneumothorax. HEART AND MEDIASTINUM: Cardiomegaly. Mitral valve annulus calcification. Aortic atherosclerosis. BONES AND SOFT TISSUES: Multilevel thoracic osteophytosis. IMPRESSION: 1. Suspected retrocardiac airspace opacity, possibly due to atelectasis or pneumonia. Electronically signed by: Norman Gatlin MD 01/14/2024 10:16 PM EDT RP Workstation: [id number]     Assessment and Plan:  Syncope Prolonged pauses 6-9 seconds Persistent AFib CHA2DS2Vasc is 5, on Eliquis , held last night here Tachy-brady  I think she will need PPM. Discussed with her and her husband bedside, AFib, tachy/brady and role PPM would play Discussed implant procedure, potential risks/benefits, she is agreeable to proceed pending MD thoughts/recommendations  I think post pacing could consider AAD, +/- ablation She does have mod-sever MS > may make rhythm control more difficult  Her last dose of Eliquis  was last night (10/2) ~ 7pm I suspect this will defer PPM to Monday    For questions or updates, please contact [facility] Please consult [email or web address] for contact info under    Signed, Charlies Macario Arthur, PA-C  01/15/2024 9:19 AM

## 2024-01-16 ENCOUNTER — Other Ambulatory Visit (HOSPITAL_COMMUNITY): Payer: Self-pay

## 2024-01-16 DIAGNOSIS — I495 Sick sinus syndrome: Secondary | ICD-10-CM | POA: Diagnosis not present

## 2024-01-16 DIAGNOSIS — R55 Syncope and collapse: Secondary | ICD-10-CM | POA: Diagnosis not present

## 2024-01-16 DIAGNOSIS — I4819 Other persistent atrial fibrillation: Secondary | ICD-10-CM | POA: Diagnosis not present

## 2024-01-16 LAB — CBC
HCT: 42 % (ref 36.0–46.0)
Hemoglobin: 13.7 g/dL (ref 12.0–15.0)
MCH: 31.7 pg (ref 26.0–34.0)
MCHC: 32.6 g/dL (ref 30.0–36.0)
MCV: 97.2 fL (ref 80.0–100.0)
Platelets: 192 K/uL (ref 150–400)
RBC: 4.32 MIL/uL (ref 3.87–5.11)
RDW: 12.6 % (ref 11.5–15.5)
WBC: 6.8 K/uL (ref 4.0–10.5)
nRBC: 0 % (ref 0.0–0.2)

## 2024-01-16 LAB — BASIC METABOLIC PANEL WITH GFR
Anion gap: 12 (ref 5–15)
BUN: 19 mg/dL (ref 8–23)
CO2: 21 mmol/L — ABNORMAL LOW (ref 22–32)
Calcium: 9.4 mg/dL (ref 8.9–10.3)
Chloride: 107 mmol/L (ref 98–111)
Creatinine, Ser: 1.05 mg/dL — ABNORMAL HIGH (ref 0.44–1.00)
GFR, Estimated: 55 mL/min — ABNORMAL LOW (ref >60)
Glucose, Bld: 91 mg/dL (ref 70–99)
Potassium: 3.7 mmol/L (ref 3.5–5.1)
Sodium: 140 mmol/L (ref 135–145)

## 2024-01-16 LAB — APTT
aPTT: 122 s — ABNORMAL HIGH (ref 24–36)
aPTT: 111 s — ABNORMAL HIGH (ref 24–36)

## 2024-01-16 LAB — MAGNESIUM: Magnesium: 1.7 mg/dL (ref 1.7–2.4)

## 2024-01-16 LAB — HEPARIN LEVEL (UNFRACTIONATED): Heparin Unfractionated: >1.1 [IU]/mL — ABNORMAL HIGH (ref 0.30–0.70)

## 2024-01-16 NOTE — Progress Notes (Signed)
 PHARMACY - ANTICOAGULATION CONSULT NOTE  Pharmacy Consult for heparin  Indication: atrial fibrillation  Allergies  Allergen Reactions   Azithromycin Swelling    Other reaction(s): swelling   Other Itching, Swelling, Other (See Comments) and Cough    Horse products Other reaction(s): reaction Other reaction(s): Unknown Other reaction(s): rash   Penicillins Swelling    Has patient had a PCN reaction causing immediate rash, facial/tongue/throat swelling, SOB or lightheadedness with hypotension: Yes Has patient had a PCN reaction causing severe rash involving mucus membranes or skin necrosis: No Has patient had a PCN reaction that required hospitalization: No Has patient had a PCN reaction occurring within the last 10 years: No If all of the above answers are NO, then may proceed with Cephalosporin use.  Other reaction(s): rash Other reaction(s): rash   Tetanus Toxoid-Containing Vaccines Swelling    Other reaction(s): sickness   Gadolinium Derivatives Hives and Itching    Pt stated that her left arm was itching and I noticed that she had two hives on her chest. No difficulty breathing, sneezing. One 25mg  of benadryl was ordered by Dr. Trudy. Pt remained at facility and was monitored before going home. -ldrake   Morphine And Codeine Nausea And Vomiting   Cefazolin     Other reaction(s): rash Other reaction(s): rash   Horse-Derived Products     Other reaction(s): reaction   Latex    Penicillin G     Other reaction(s): Unknown   Tessalon [Benzonatate] Other (See Comments)    hypotension   Amoxicillin-Pot Clavulanate Rash    Has patient had a PCN reaction causing immediate rash, facial/tongue/throat swelling, SOB or lightheadedness with hypotension: Yes Has patient had a PCN reaction causing severe rash involving mucus membranes or skin necrosis: Yes Has patient had a PCN reaction that required hospitalization:No Has patient had a PCN reaction occurring within the last 10 years:  No If all of the above answers are NO, then may proceed with Cephalosporin use.  Other reaction(s): rash, swelling   Patient Measurements: Height: 5' 2 (157.5 cm) Weight: 84 kg (185 lb 3 oz) IBW/kg (Calculated) : 50.1 HEPARIN  DW (KG): 69  Vital Signs: Temp: 98.3 F (36.8 C) (10/04 0507) Temp Source: Oral (10/04 0507) BP: 148/80 (10/04 0516) Pulse Rate: 113 (10/04 0507)  Labs: Recent Labs    01/14/24 2101 01/15/24 0044 01/15/24 2105 01/16/24 0508  HGB 13.6 12.2  --  13.7  HCT 43.2 38.8  --  42.0  PLT 227 199  --  192  APTT  --   --  93* 122*  LABPROT  --  17.4*  --   --   INR  --  1.4*  --   --   HEPARINUNFRC  --   --   --  >1.10*  CREATININE 1.29* 1.19*  --  1.05*  TROPONINIHS 7 8  --   --    Estimated Creatinine Clearance: 45.1 mL/min (A) (by C-G formula based on SCr of 1.05 mg/dL (H)).  Medical History: Past Medical History:  Diagnosis Date   Anxiety    Arthritis    Bone spur    Coronary artery disease    stent - 1999   Depression    Fibrocystic breast changes    GERD (gastroesophageal reflux disease)    Glucosuria    Hernia, inguinal, left 10/2002   Hypertension    Melanoma (HCC) 10/2008   right shoulder and arm   Menorrhagia    Myocardial infarction (HCC) 1999   Assessment: 77  yoF with hx AFib on apixaban  PTA admitted with syncope / pauses. Planning for permanent pacemaker placement Monday; pharmacy to dose IV heparin  while apixaban  washes out prior to procedure. Last apixaban  dose 10/2 pm.  Hgb and plt WNL. No issues with heparin  infusion of s/sx bleeding documented.   aPTT this AM 10/4 supratherapeutic at 122 on infusion of 1200 units/h; heparin  level likely still elevated in setting of recent DOAC administration. Will continue to monitor by aPTT until heparin  level/aPTT clearly correlating.   Goal of Therapy:  Heparin  level 0.3-0.7 units/ml aPTT 66-102 seconds Monitor platelets by anticoagulation protocol: Yes   Plan:  -Decrease heparin   infusion to 1050 units/h; stop at 2359 10/5 for ICD.  -Recheck aPTT in 8h.  -Daily aPTT, heparin  level, CBC.   Maurilio Patten, PharmD PGY1 Pharmacy Resident [facility] 01/16/2024 7:08 AM

## 2024-01-16 NOTE — Progress Notes (Signed)
 PHARMACY - ANTICOAGULATION CONSULT NOTE  Pharmacy Consult for heparin  Indication: atrial fibrillation  Allergies  Allergen Reactions   Azithromycin Swelling    Other reaction(s): swelling   Other Itching, Swelling, Other (See Comments) and Cough    Horse products Other reaction(s): reaction Other reaction(s): Unknown Other reaction(s): rash   Penicillins Swelling    Has patient had a PCN reaction causing immediate rash, facial/tongue/throat swelling, SOB or lightheadedness with hypotension: Yes Has patient had a PCN reaction causing severe rash involving mucus membranes or skin necrosis: No Has patient had a PCN reaction that required hospitalization: No Has patient had a PCN reaction occurring within the last 10 years: No If all of the above answers are NO, then may proceed with Cephalosporin use.  Other reaction(s): rash Other reaction(s): rash   Tetanus Toxoid-Containing Vaccines Swelling    Other reaction(s): sickness   Gadolinium Derivatives Hives and Itching    Pt stated that her left arm was itching and I noticed that she had two hives on her chest. No difficulty breathing, sneezing. One 25mg  of benadryl was ordered by Dr. Trudy. Pt remained at facility and was monitored before going home. -ldrake   Morphine And Codeine Nausea And Vomiting   Cefazolin     Other reaction(s): rash Other reaction(s): rash   Horse-Derived Products     Other reaction(s): reaction   Latex    Penicillin G     Other reaction(s): Unknown   Tessalon [Benzonatate] Other (See Comments)    hypotension   Amoxicillin-Pot Clavulanate Rash    Has patient had a PCN reaction causing immediate rash, facial/tongue/throat swelling, SOB or lightheadedness with hypotension: Yes Has patient had a PCN reaction causing severe rash involving mucus membranes or skin necrosis: Yes Has patient had a PCN reaction that required hospitalization:No Has patient had a PCN reaction occurring within the last 10 years:  No If all of the above answers are NO, then may proceed with Cephalosporin use.  Other reaction(s): rash, swelling   Patient Measurements: Height: 5' 2 (157.5 cm) Weight: 84 kg (185 lb 3 oz) IBW/kg (Calculated) : 50.1 HEPARIN  DW (KG): 69  Vital Signs: Temp: 98.2 F (36.8 C) (10/04 2023) Temp Source: Oral (10/04 2023) BP: 128/86 (10/04 2023) Pulse Rate: 109 (10/04 2023)  Labs: Recent Labs    01/14/24 2101 01/15/24 0044 01/15/24 2105 01/16/24 0508 01/16/24 1427  HGB 13.6 12.2  --  13.7  --   HCT 43.2 38.8  --  42.0  --   PLT 227 199  --  192  --   APTT  --   --  93* 122* 111*  LABPROT  --  17.4*  --   --   --   INR  --  1.4*  --   --   --   HEPARINUNFRC  --   --   --  >1.10*  --   CREATININE 1.29* 1.19*  --  1.05*  --   TROPONINIHS 7 8  --   --   --    Estimated Creatinine Clearance: 45.1 mL/min (A) (by C-G formula based on SCr of 1.05 mg/dL (H)).  Medical History: Past Medical History:  Diagnosis Date   Anxiety    Arthritis    Bone spur    Coronary artery disease    stent - 1999   Depression    Fibrocystic breast changes    GERD (gastroesophageal reflux disease)    Glucosuria    Hernia, inguinal, left 10/2002  Hypertension    Melanoma (HCC) 10/2008   right shoulder and arm   Menorrhagia    Myocardial infarction [facility]) 1999   Assessment: 70 yoF with hx AFib on apixaban  PTA admitted with syncope / pauses. Planning for permanent pacemaker placement Monday; pharmacy to dose IV heparin  while apixaban  washes out prior to procedure. Last apixaban  dose 10/2 pm.  Hgb and plt WNL. No issues with heparin  infusion of s/sx bleeding documented.   aPTT this AM 10/4 supratherapeutic at 122 on infusion of 1200 units/h; heparin  level likely still elevated in setting of recent DOAC administration. Will continue to monitor by aPTT until heparin  level/aPTT clearly correlating.   2nd shift - aPTT slightly elevated at 111 despite dose decrease   Goal of Therapy:  Heparin   level 0.3-0.7 units/ml aPTT 66-102 seconds Monitor platelets by anticoagulation protocol: Yes   Plan:  -Decrease heparin  infusion to 950 units/h stop at 2359 10/5 for ICD.  -Recheck aPTT in 8h.  -Daily aPTT, heparin  level, CBC.   Rankin Sams, PharmD, BCPS, BCCCP Clinical Pharmacist

## 2024-01-16 NOTE — Progress Notes (Signed)
   Rounding Note    Patient Name: KATRESE SHELL Date of Encounter: 01/16/2024  [facility] Cardiologist: Vina Gull, MD   Subjective   No acute events overnight.  Having trouble sleeping.  Vital Signs    Vitals:   01/16/24 0039 01/16/24 0507 01/16/24 0516 01/16/24 0756  BP: (!) 138/94 (!) 130/100 (!) 148/80 (!) 144/93  Pulse:  (!) 113  (!) 136  Resp: 17 15  16   Temp: 97.9 F (36.6 C) 98.3 F (36.8 C)  97.9 F (36.6 C)  TempSrc: Oral Oral  Oral  SpO2: 97%  97% 97%  Weight:      Height:        Intake/Output Summary (Last 24 hours) at 01/16/2024 0851 Last data filed at 01/16/2024 0000 Gross per 24 hour  Intake 368.35 ml  Output 0 ml  Net 368.35 ml      01/15/2024    1:00 AM 01/14/2024   11:09 AM 01/07/2024    3:15 PM  Last 3 Weights  Weight (lbs) 185 lb 3 oz 185 lb 9.6 oz 185 lb  Weight (kg) 84 kg 84.188 kg 83.915 kg      Telemetry    Atrial fibrillation with intermittent rapid ventricular rates up to 140s- Personally Reviewed  ECG    Personally Reviewed  Physical Exam   GEN: No acute distress.  Laying flat Cardiac: RRR, no murmurs, rubs, or gallops.  Respiratory: Clear to auscultation bilaterally. Psych: Normal affect   Assessment & Plan    #Syncope #Symptomatic bradycardia #Tachycardia-bradycardia syndrome The patient will require a permanent pacemaker.  Plan for Monday afternoon with me.  She will need to wait over the weekend to allow her Eliquis  to washout.   Pacemaker procedure was discussed in detail with the patient and her husband who is at bedside.  She is agreeable to proceed.   Risks, benefits, alternatives to PPM implantation were discussed in detail with the patient today. The patient understands that the risks include but are not limited to bleeding, infection, pneumothorax, perforation, tamponade, vascular damage, renal failure, MI, stroke, death, and lead dislodgement and wishes to proceed.  We will therefore schedule device  implantation at the next available time.   Keep n.p.o. Sunday night  Dempsey Knotek T. Cindie, MD, [facility], [facility] Cardiac Electrophysiology

## 2024-01-17 DIAGNOSIS — I4430 Unspecified atrioventricular block: Secondary | ICD-10-CM

## 2024-01-17 LAB — BASIC METABOLIC PANEL WITH GFR
Anion gap: 13 (ref 5–15)
BUN: 20 mg/dL (ref 8–23)
CO2: 21 mmol/L — ABNORMAL LOW (ref 22–32)
Calcium: 9.5 mg/dL (ref 8.9–10.3)
Chloride: 106 mmol/L (ref 98–111)
Creatinine, Ser: 1 mg/dL (ref 0.44–1.00)
GFR, Estimated: 58 mL/min — ABNORMAL LOW (ref >60)
Glucose, Bld: 89 mg/dL (ref 70–99)
Potassium: 3.6 mmol/L (ref 3.5–5.1)
Sodium: 140 mmol/L (ref 135–145)

## 2024-01-17 LAB — APTT: aPTT: 89 s — ABNORMAL HIGH (ref 24–36)

## 2024-01-17 LAB — CBC
HCT: 43.1 % (ref 36.0–46.0)
Hemoglobin: 13.9 g/dL (ref 12.0–15.0)
MCH: 31.5 pg (ref 26.0–34.0)
MCHC: 32.3 g/dL (ref 30.0–36.0)
MCV: 97.7 fL (ref 80.0–100.0)
Platelets: 194 K/uL (ref 150–400)
RBC: 4.41 MIL/uL (ref 3.87–5.11)
RDW: 12.8 % (ref 11.5–15.5)
WBC: 6.5 K/uL (ref 4.0–10.5)
nRBC: 0 % (ref 0.0–0.2)

## 2024-01-17 LAB — HEPARIN LEVEL (UNFRACTIONATED): Heparin Unfractionated: 0.79 [IU]/mL — ABNORMAL HIGH (ref 0.30–0.70)

## 2024-01-17 LAB — MAGNESIUM: Magnesium: 1.6 mg/dL — ABNORMAL LOW (ref 1.7–2.4)

## 2024-01-17 MED ORDER — SODIUM CHLORIDE 0.9 % IV SOLN
80.0000 mg | INTRAVENOUS | Status: AC
  Administered 2024-01-18: 80 mg
  Filled 2024-01-17: qty 2

## 2024-01-17 MED ORDER — SODIUM CHLORIDE 0.9 % IV SOLN: INTRAVENOUS | Status: DC

## 2024-01-17 MED ORDER — VANCOMYCIN HCL IN DEXTROSE 1-5 GM/200ML-% IV SOLN
1000.0000 mg | INTRAVENOUS | Status: AC
  Administered 2024-01-18: 1000 mg via INTRAVENOUS
  Filled 2024-01-17: qty 200

## 2024-01-17 MED ORDER — INFLUENZA VAC SPLIT HIGH-DOSE 0.5 ML IM SUSY
0.5000 mL | PREFILLED_SYRINGE | INTRAMUSCULAR | Status: AC
  Administered 2024-01-19: 0.5 mL via INTRAMUSCULAR
  Filled 2024-01-17: qty 0.5

## 2024-01-17 MED ORDER — CHLORHEXIDINE GLUCONATE 4 % EX SOLN
60.0000 mL | Freq: Once | CUTANEOUS | Status: AC
  Administered 2024-01-18: 4 via TOPICAL

## 2024-01-17 MED ORDER — CHLORHEXIDINE GLUCONATE 4 % EX SOLN
60.0000 mL | Freq: Once | CUTANEOUS | Status: AC
  Administered 2024-01-18: 4 via TOPICAL
  Filled 2024-01-17: qty 60

## 2024-01-17 MED ORDER — SODIUM CHLORIDE 0.9% FLUSH: 3.0000 mL | INTRAVENOUS | Status: DC | PRN

## 2024-01-17 NOTE — Progress Notes (Signed)
 PHARMACY - ANTICOAGULATION CONSULT NOTE  Pharmacy Consult for heparin  Indication: atrial fibrillation  Allergies  Allergen Reactions   Azithromycin Swelling    Other reaction(s): swelling   Other Itching, Swelling, Other (See Comments) and Cough    Horse products Other reaction(s): reaction Other reaction(s): Unknown Other reaction(s): rash   Penicillins Swelling    Has patient had a PCN reaction causing immediate rash, facial/tongue/throat swelling, SOB or lightheadedness with hypotension: Yes Has patient had a PCN reaction causing severe rash involving mucus membranes or skin necrosis: No Has patient had a PCN reaction that required hospitalization: No Has patient had a PCN reaction occurring within the last 10 years: No If all of the above answers are NO, then may proceed with Cephalosporin use.  Other reaction(s): rash Other reaction(s): rash   Tetanus Toxoid-Containing Vaccines Swelling    Other reaction(s): sickness   Gadolinium Derivatives Hives and Itching    Pt stated that her left arm was itching and I noticed that she had two hives on her chest. No difficulty breathing, sneezing. One 25mg  of benadryl was ordered by Dr. Trudy. Pt remained at facility and was monitored before going home. -ldrake   Morphine And Codeine Nausea And Vomiting   Cefazolin     Other reaction(s): rash Other reaction(s): rash   Horse-Derived Products     Other reaction(s): reaction   Latex    Penicillin G     Other reaction(s): Unknown   Tessalon [Benzonatate] Other (See Comments)    hypotension   Amoxicillin-Pot Clavulanate Rash    Has patient had a PCN reaction causing immediate rash, facial/tongue/throat swelling, SOB or lightheadedness with hypotension: Yes Has patient had a PCN reaction causing severe rash involving mucus membranes or skin necrosis: Yes Has patient had a PCN reaction that required hospitalization:No Has patient had a PCN reaction occurring within the last 10 years:  No If all of the above answers are NO, then may proceed with Cephalosporin use.  Other reaction(s): rash, swelling   Patient Measurements: Height: 5' 2 (157.5 cm) Weight: 84 kg (185 lb 3 oz) IBW/kg (Calculated) : 50.1 HEPARIN  DW (KG): 69  Vital Signs: Temp: 97.8 F (36.6 C) (10/05 0500) Temp Source: Oral (10/05 0500) BP: 149/88 (10/05 0500) Pulse Rate: 93 (10/05 0500)  Labs: Recent Labs    01/14/24 2101 01/15/24 0044 01/15/24 2105 01/16/24 0508 01/16/24 1427 01/17/24 0222  HGB 13.6 12.2  --  13.7  --  13.9  HCT 43.2 38.8  --  42.0  --  43.1  PLT 227 199  --  192  --  194  APTT  --   --    < > 122* 111* 89*  LABPROT  --  17.4*  --   --   --   --   INR  --  1.4*  --   --   --   --   HEPARINUNFRC  --   --   --  >1.10*  --  0.79*  CREATININE 1.29* 1.19*  --  1.05*  --  1.00  TROPONINIHS 7 8  --   --   --   --    < > = values in this interval not displayed.   Estimated Creatinine Clearance: 47.4 mL/min (by C-G formula based on SCr of 1 mg/dL).  Medical History: Past Medical History:  Diagnosis Date   Anxiety    Arthritis    Bone spur    Coronary artery disease    stent - 1999  Depression    Fibrocystic breast changes    GERD (gastroesophageal reflux disease)    Glucosuria    Hernia, inguinal, left 10/2002   Hypertension    Melanoma (HCC) 10/2008   right shoulder and arm   Menorrhagia    Myocardial infarction (HCC) 1999   Assessment: 37 yoF with hx AFib on apixaban  PTA admitted with syncope / pauses. Planning for permanent pacemaker placement Monday; pharmacy to dose IV heparin  while apixaban  washes out prior to procedure. Last apixaban  dose 10/2 pm.  Hgb and plt WNL. No issues with heparin  infusion of s/sx bleeding documented.   10/5 AM aPTT therapeutic at 89 on infusion of 950 units/h. Heparin  level remains elevated in setting of recent DOAC administration. Since infusion scheduled to end at 23:59 tonight, will not check confirmatory aPTT.   Goal of  Therapy:  Heparin  level 0.3-0.7 units/ml aPTT 66-102 seconds Monitor platelets by anticoagulation protocol: Yes   Plan:  -Continue heparin  infusion at 950 units/h; stop at 2359 10/5 for ICD.  -Monitor for ability to restart PTA apixaban  following procedure.   Maurilio Patten, PharmD PGY1 Pharmacy Resident [facility] 01/17/2024 7:38 AM

## 2024-01-17 NOTE — Progress Notes (Signed)
   Rounding Note    Patient Name: [name] Date of Encounter: 01/17/2024  [facility] Cardiologist: Vina Gull, MD   Subjective   No acute events overnight.   Vital Signs    Vitals:   01/16/24 2300 01/17/24 0024 01/17/24 0500 01/17/24 0900  BP:  (!) 141/77 (!) 149/88 137/81  Pulse:  (!) 107 93   Resp:  18 18 12   Temp: 98.1 F (36.7 C) 98.1 F (36.7 C) 97.8 F (36.6 C) 98.1 F (36.7 C)  TempSrc:  Oral Oral Oral  SpO2:   98%   Weight:      Height:        Intake/Output Summary (Last 24 hours) at 01/17/2024 1015 Last data filed at 01/17/2024 0600 Gross per 24 hour  Intake 452.27 ml  Output --  Net 452.27 ml      01/15/2024    1:00 AM 01/14/2024   11:09 AM 01/07/2024    3:15 PM  Last 3 Weights  Weight (lbs) 185 lb 3 oz 185 lb 9.6 oz 185 lb  Weight (kg) 84 kg 84.188 kg 83.915 kg      Telemetry    Personally Reviewed  ECG    Personally Reviewed  Physical Exam   GEN: No acute distress.  Laying flat Cardiac: RRR, no murmurs, rubs, or gallops.  Respiratory: Clear to auscultation bilaterally. Psych: Normal affect   Assessment & Plan    #Syncope #Symptomatic bradycardia #Tachycardia-bradycardia syndrome The patient will require a permanent pacemaker.  Plan for Monday afternoon with me.  She will need to wait over the weekend to allow her Eliquis  to washout.   Pacemaker procedure was discussed in detail with the patient and her husband who is at bedside.  She is agreeable to proceed.   Risks, benefits, alternatives to PPM implantation were discussed in detail with the patient today. The patient understands that the risks include but are not limited to bleeding, infection, pneumothorax, perforation, tamponade, vascular damage, renal failure, MI, stroke, death, and lead dislodgement and wishes to proceed.  We will therefore schedule device implantation at the next available time.   Keep n.p.o. Sunday night  Talullah Abate T. Cindie, MD, [facility],  [facility] Cardiac Electrophysiology

## 2024-01-18 ENCOUNTER — Ambulatory Visit (HOSPITAL_COMMUNITY): Admission: RE | Admit: 2024-01-18 | Source: Home / Self Care | Admitting: Cardiology

## 2024-01-18 ENCOUNTER — Other Ambulatory Visit: Payer: Self-pay

## 2024-01-18 ENCOUNTER — Inpatient Hospital Stay (HOSPITAL_COMMUNITY): Admission: EM | Disposition: A | Discharge status: Home / Self Care | Payer: Self-pay | Source: Home / Self Care

## 2024-01-18 DIAGNOSIS — I4891 Unspecified atrial fibrillation: Secondary | ICD-10-CM

## 2024-01-18 DIAGNOSIS — I4430 Unspecified atrioventricular block: Secondary | ICD-10-CM

## 2024-01-18 DIAGNOSIS — R001 Bradycardia, unspecified: Secondary | ICD-10-CM | POA: Diagnosis not present

## 2024-01-18 HISTORY — PX: PACEMAKER IMPLANT: EP1218

## 2024-01-18 LAB — CBC
HCT: 43.4 % (ref 36.0–46.0)
Hemoglobin: 14 g/dL (ref 12.0–15.0)
MCH: 31.6 pg (ref 26.0–34.0)
MCHC: 32.3 g/dL (ref 30.0–36.0)
MCV: 98 fL (ref 80.0–100.0)
Platelets: 175 K/uL (ref 150–400)
RBC: 4.43 MIL/uL (ref 3.87–5.11)
RDW: 12.7 % (ref 11.5–15.5)
WBC: 5.9 K/uL (ref 4.0–10.5)
nRBC: 0 % (ref 0.0–0.2)

## 2024-01-18 LAB — HEPARIN LEVEL (UNFRACTIONATED): Heparin Unfractionated: 0.11 [IU]/mL — ABNORMAL LOW (ref 0.30–0.70)

## 2024-01-18 LAB — APTT: aPTT: 28 s (ref 24–36)

## 2024-01-18 LAB — BASIC METABOLIC PANEL WITH GFR
Anion gap: 10 (ref 5–15)
BUN: 16 mg/dL (ref 8–23)
CO2: 22 mmol/L (ref 22–32)
Calcium: 9.3 mg/dL (ref 8.9–10.3)
Chloride: 106 mmol/L (ref 98–111)
Creatinine, Ser: 0.95 mg/dL (ref 0.44–1.00)
GFR, Estimated: >60 mL/min (ref >60)
Glucose, Bld: 97 mg/dL (ref 70–99)
Potassium: 3.4 mmol/L — ABNORMAL LOW (ref 3.5–5.1)
Sodium: 138 mmol/L (ref 135–145)

## 2024-01-18 LAB — MAGNESIUM: Magnesium: 1.4 mg/dL — ABNORMAL LOW (ref 1.7–2.4)

## 2024-01-18 SURGERY — PACEMAKER IMPLANT

## 2024-01-18 MED ORDER — TRAMADOL HCL 50 MG PO TABS
50.0000 mg | ORAL_TABLET | Freq: Three times a day (TID) | ORAL | Status: DC | PRN
Start: 2024-01-18 — End: 2024-01-19
  Administered 2024-01-18 – 2024-01-19 (×3): 50 mg via ORAL
  Filled 2024-01-18 (×3): qty 1

## 2024-01-18 MED ORDER — FENTANYL CITRATE (PF) 100 MCG/2ML IJ SOLN
INTRAMUSCULAR | Status: DC | PRN
  Administered 2024-01-18 (×2): 50 ug via INTRAVENOUS

## 2024-01-18 MED ORDER — HEPARIN (PORCINE) IN NACL 1000-0.9 UT/500ML-% IV SOLN
INTRAVENOUS | Status: DC | PRN
Start: 2024-01-18 — End: 2024-01-18
  Administered 2024-01-18: 500 mL

## 2024-01-18 MED ORDER — LIDOCAINE HCL (PF) 1 % IJ SOLN
INTRAMUSCULAR | Status: AC
  Filled 2024-01-18: qty 60

## 2024-01-18 MED ORDER — CHLORHEXIDINE GLUCONATE 4 % EX SOLN
CUTANEOUS | Status: AC
  Filled 2024-01-18: qty 15

## 2024-01-18 MED ORDER — FENTANYL CITRATE (PF) 100 MCG/2ML IJ SOLN
INTRAMUSCULAR | Status: AC
  Filled 2024-01-18: qty 2

## 2024-01-18 MED ORDER — POTASSIUM CHLORIDE CRYS ER 20 MEQ PO TBCR
40.0000 meq | EXTENDED_RELEASE_TABLET | Freq: Once | ORAL | Status: AC
  Administered 2024-01-18: 40 meq via ORAL
  Filled 2024-01-18: qty 2

## 2024-01-18 MED ORDER — VANCOMYCIN HCL IN DEXTROSE 1-5 GM/200ML-% IV SOLN
INTRAVENOUS | Status: AC
  Filled 2024-01-18: qty 200

## 2024-01-18 MED ORDER — MIDAZOLAM HCL 2 MG/2ML IJ SOLN
INTRAMUSCULAR | Status: AC
  Filled 2024-01-18: qty 2

## 2024-01-18 MED ORDER — LIDOCAINE HCL (PF) 1 % IJ SOLN
INTRAMUSCULAR | Status: DC | PRN
Start: 2024-01-18 — End: 2024-01-18
  Administered 2024-01-18: 60 mL

## 2024-01-18 MED ORDER — SODIUM CHLORIDE 0.9 % IV SOLN
INTRAVENOUS | Status: AC
  Filled 2024-01-18: qty 2

## 2024-01-18 MED ORDER — MIDAZOLAM HCL 5 MG/5ML IJ SOLN
INTRAMUSCULAR | Status: DC | PRN
  Administered 2024-01-18 (×3): 1 mg via INTRAVENOUS

## 2024-01-18 SURGICAL SUPPLY — 13 items
CABLE SURGICAL S-101-97-12 (CABLE) ×1 IMPLANT
CATH CPS LOCATOR 3D MED (CATHETERS) IMPLANT
KIT MICROPUNCTURE NIT STIFF (SHEATH) IMPLANT
LEAD ULTIPACE 52 LPA1231/52 (Lead) IMPLANT
LEAD ULTIPACE 65 LPA1231/65 (Lead) IMPLANT
PACEMAKER ASSURITY DR-RF (Pacemaker) IMPLANT
PAD DEFIB RADIO PHYSIO CONN (PAD) ×1 IMPLANT
SHEATH 7FR PRELUDE SNAP 13 (SHEATH) IMPLANT
SHEATH 9FR PRELUDE SNAP 13 (SHEATH) IMPLANT
SLITTER AGILIS HISPRO (INSTRUMENTS) IMPLANT
TOOL HELIX LOCKING (MISCELLANEOUS) IMPLANT
TRAY PACEMAKER INSERTION (PACKS) ×1 IMPLANT
WIRE HI TORQ VERSACORE-J 145CM (WIRE) IMPLANT

## 2024-01-18 NOTE — Discharge Instructions (Addendum)
 After Your Pacemaker   You have a Abbott Pacemaker  If you have a Medtronic or Biotronik device, plug in your home monitor once you get home, and no manual interaction is required.   If you have an Abbott or [employer] device, plug your home monitor once you get home, sit near the device, and press the large activation button. Sit nearby until the process is complete, usually notated by lights on the monitor.   If you were set up for monitoring using an app on your phone, make sure the app remains open in the background and the Bluetooth remains on.  ACTIVITY Do not lift your arm above shoulder height for 1 week after your procedure. After 7 days, you may progress as below.  You should remove your sling 24 hours after your procedure, unless otherwise instructed by your provider.     Monday January 25, 2024  Tuesday January 26, 2024 Wednesday January 27, 2024 Thursday January 28, 2024   Do not lift, push, pull, or carry anything over 10 pounds with the affected arm until 6 weeks (Monday February 29, 2024 ) after your procedure.   You may drive AFTER your wound check, unless you have been told otherwise by your provider.   Ask your healthcare provider when you can go back to work   INCISION/Dressing This patient should resume their Eliquis  on Friday January 22, 2024  If large square, outer bandage is left in place, this can be removed after 24 hours from your procedure. Do not remove steri-strips or glue as below.   If a PRESSURE DRESSING (a bulky dressing that usually goes up over your shoulder) was applied or left in place, please follow instructions given by your provider on when to return to have this removed.   Monitor your Pacemaker site for redness, swelling, and drainage. Call the device clinic at [phone] if you experience these symptoms or fever/chills.  If your incision is sealed with Steri-strips or staples, you may shower 7 days after your procedure or when  told by your provider. Do not remove the steri-strips or let the shower hit directly on your site. You may wash around your site with soap and water.    If you were discharged in a sling, please do not wear this during the day more than 48 hours after your surgery unless otherwise instructed. This may increase the risk of stiffness and soreness in your shoulder.   Avoid lotions, ointments, or perfumes over your incision until it is well-healed.  You may use a hot tub or a pool AFTER your wound check appointment if the incision is completely closed.  Pacemaker Alerts:  Some alerts are vibratory and others beep. These are NOT emergencies. Please call our office to let us  know. If this occurs at night or on weekends, it can wait until the next business day. Send a remote transmission.  If your device is capable of reading fluid status (for heart failure), you will be offered monthly monitoring to review this with you.   DEVICE MANAGEMENT Remote monitoring is used to monitor your pacemaker from home. This monitoring is scheduled every 91 days by our office. It allows us  to keep an eye on the functioning of your device to ensure it is working properly. You will routinely see your Electrophysiologist annually (more often if necessary).  This will appear as a REMOTE check on your MyChart schedule. These are automatic and there is nothing for you to manually do unless  otherwise instructed.  You should receive your ID card for your new device in 4-8 weeks. Keep this card with you at all times once received. Consider wearing a medical alert bracelet or necklace.  Your Pacemaker may be MRI compatible. This will be discussed at your next office visit/wound check.  You should avoid contact with strong electric or magnetic fields.   Do not use amateur (ham) radio equipment or electric (arc) welding torches. MP3 player headphones with magnets should not be used. Some devices are safe to use if held at least 12  inches (30 cm) from your Pacemaker. These include power tools, lawn mowers, and speakers. If you are unsure if something is safe to use, ask your health care provider.  When using your cell phone, hold it to the ear that is on the opposite side from the Pacemaker. Do not leave your cell phone in a pocket over the Pacemaker.  You may safely use electric blankets, heating pads, computers, and microwave ovens.  Call the office right away if: You have chest pain. You feel more short of breath than you have felt before. You feel more light-headed than you have felt before. Your incision starts to open up.  This information is not intended to replace advice given to you by your health care provider. Make sure you discuss any questions you have with your health care provider.

## 2024-01-18 NOTE — Progress Notes (Signed)
  Patient Name: [name] Date of Encounter: 01/18/2024  Primary Cardiologist: Vina Gull, MD Electrophysiologist: None  Interval Summary   No new complaints  Tentatively on for PPM his afternoon. Understands she may be pushed pending lab availability.   Vital Signs    Vitals:   01/17/24 1300 01/17/24 2011 01/18/24 0017 01/18/24 0530  BP: (!) 174/108 131/85 122/79 (!) 121/98  Pulse:  89 (!) 120 98  Resp:  18 16 16   Temp: 97.6 F (36.4 C) 97.8 F (36.6 C) 98.2 F (36.8 C) 97.8 F (36.6 C)  TempSrc: Oral Oral Oral Oral  SpO2:  98% 97% 94%  Weight:      Height:        Intake/Output Summary (Last 24 hours) at 01/18/2024 0805 Last data filed at 01/18/2024 0531 Gross per 24 hour  Intake 395.07 ml  Output --  Net 395.07 ml   Filed Weights   01/15/24 0100  Weight: 84 kg    Physical Exam    GEN- NAD, Alert and oriented  Lungs- Clear to ausculation bilaterally, normal work of breathing Cardiac- Irregularly irregular rate and rhythm, no murmurs, rubs or gallops GI- soft, NT, ND, + BS Extremities- no clubbing or cyanosis. No edema  Telemetry    AF 80-110s with occasional pauses, longest 3 seconds (personally reviewed)  Hospital Course    [name] is a 78 y.o. female admitted for Syncope and symptomatic bradycardia / tachy-brady syndrome.  Assessment & Plan    Syncope Symptomatic Bradycardia Tachy-brady Explained risks, benefits, and alternatives to PPM implantation, including but not limited to bleeding, infection, pneumothorax, pericardial effusion, lead dislodgement, heart attack, stroke, or death.  Pt verbalized understanding and agrees to proceed.    OAC held  Tentatively on for this afternoon with Dr. Cindie.    For questions or updates, please contact  [facility] Please consult [email or web address] for contact info under     Signed, Ozell Prentice Passey, PA-C  01/18/2024, 8:05 AM

## 2024-01-19 ENCOUNTER — Encounter (HOSPITAL_COMMUNITY): Payer: Self-pay | Admitting: Cardiovascular Disease

## 2024-01-19 ENCOUNTER — Inpatient Hospital Stay (HOSPITAL_COMMUNITY)

## 2024-01-19 ENCOUNTER — Other Ambulatory Visit (HOSPITAL_COMMUNITY): Payer: Self-pay

## 2024-01-19 DIAGNOSIS — I700 Atherosclerosis of aorta: Secondary | ICD-10-CM | POA: Diagnosis not present

## 2024-01-19 DIAGNOSIS — I4430 Unspecified atrioventricular block: Secondary | ICD-10-CM | POA: Diagnosis not present

## 2024-01-19 DIAGNOSIS — I3481 Nonrheumatic mitral (valve) annulus calcification: Secondary | ICD-10-CM | POA: Diagnosis not present

## 2024-01-19 DIAGNOSIS — Z950 Presence of cardiac pacemaker: Secondary | ICD-10-CM | POA: Diagnosis not present

## 2024-01-19 LAB — CBC
HCT: 44.7 % (ref 36.0–46.0)
Hemoglobin: 14.4 g/dL (ref 12.0–15.0)
MCH: 31.8 pg (ref 26.0–34.0)
MCHC: 32.2 g/dL (ref 30.0–36.0)
MCV: 98.7 fL (ref 80.0–100.0)
Platelets: 182 K/uL (ref 150–400)
RBC: 4.53 MIL/uL (ref 3.87–5.11)
RDW: 12.5 % (ref 11.5–15.5)
WBC: 7.1 K/uL (ref 4.0–10.5)
nRBC: 0 % (ref 0.0–0.2)

## 2024-01-19 LAB — BASIC METABOLIC PANEL WITH GFR
Anion gap: 10 (ref 5–15)
BUN: 14 mg/dL (ref 8–23)
CO2: 24 mmol/L (ref 22–32)
Calcium: 9.7 mg/dL (ref 8.9–10.3)
Chloride: 105 mmol/L (ref 98–111)
Creatinine, Ser: 1.05 mg/dL — ABNORMAL HIGH (ref 0.44–1.00)
GFR, Estimated: 55 mL/min — ABNORMAL LOW (ref >60)
Glucose, Bld: 104 mg/dL — ABNORMAL HIGH (ref 70–99)
Potassium: 3.9 mmol/L (ref 3.5–5.1)
Sodium: 139 mmol/L (ref 135–145)

## 2024-01-19 LAB — MAGNESIUM: Magnesium: 1.4 mg/dL — ABNORMAL LOW (ref 1.7–2.4)

## 2024-01-19 MED ORDER — TRAMADOL HCL 50 MG PO TABS
50.0000 mg | ORAL_TABLET | Freq: Two times a day (BID) | ORAL | 0 refills | Status: AC | PRN
  Filled 2024-01-19: qty 10, 5d supply, fill #0

## 2024-01-19 MED FILL — Midazolam HCl Inj 2 MG/2ML (Base Equivalent): INTRAMUSCULAR | Qty: 3 | Status: AC

## 2024-01-19 NOTE — Discharge Summary (Signed)
 ELECTROPHYSIOLOGY PROCEDURE DISCHARGE SUMMARY    Patient ID: [name],  MRN: [id number], DOB/AGE: 1945/04/27 78 y.o.  Admit date: 01/14/2024 Discharge date: 01/19/2024  Primary Care Physician: Dayna Motto, DO  Primary Cardiologist: Vina Gull, MD  Electrophysiologist: Dr. Nancey   Primary Discharge Diagnosis:  Symptomatic bradycardia status post pacemaker implantation this admission [facility] Syncope Tachy-brady   Allergies  Allergen Reactions   Azithromycin Swelling    Other reaction(s): swelling   Other Itching, Swelling, Other (See Comments) and Cough    Horse products Other reaction(s): reaction Other reaction(s): Unknown Other reaction(s): rash   Penicillins Swelling    Has patient had a PCN reaction causing immediate rash, facial/tongue/throat swelling, SOB or lightheadedness with hypotension: Yes Has patient had a PCN reaction causing severe rash involving mucus membranes or skin necrosis: No Has patient had a PCN reaction that required hospitalization: No Has patient had a PCN reaction occurring within the last 10 years: No If all of the above answers are NO, then may proceed with Cephalosporin use.  Other reaction(s): rash Other reaction(s): rash   Tetanus Toxoid-Containing Vaccines Swelling    Other reaction(s): sickness   Gadolinium Derivatives Hives and Itching    Pt stated that her left arm was itching and I noticed that she had two hives on her chest. No difficulty breathing, sneezing. One 25mg  of benadryl was ordered by Dr. Trudy. Pt remained at facility and was monitored before going home. -ldrake   Morphine And Codeine Nausea And Vomiting   Cefazolin     Other reaction(s): rash Other reaction(s): rash   Horse-Derived Products     Other reaction(s): reaction   Latex    Penicillin G     Other reaction(s): Unknown   Tessalon [Benzonatate] Other (See Comments)    hypotension   Amoxicillin-Pot Clavulanate Rash    Has patient had a  PCN reaction causing immediate rash, facial/tongue/throat swelling, SOB or lightheadedness with hypotension: Yes Has patient had a PCN reaction causing severe rash involving mucus membranes or skin necrosis: Yes Has patient had a PCN reaction that required hospitalization:No Has patient had a PCN reaction occurring within the last 10 years: No If all of the above answers are NO, then may proceed with Cephalosporin use.  Other reaction(s): rash, swelling     Procedures This Admission:  1.  Implantation of a Abbott Dual Chamber PPM on 01/18/2024 by Dr. Nancey with details as outlined in op note.  There were no immediate post procedure complications.   2.  CXR on 01/19/2024 demonstrated no pneumothorax status post device implantation.       Brief HPI: [name] is a 78 y.o. female was admitted for syncope in the setting of bradycardia and electrophysiology team asked to see for consideration of PPM implantation.  Past medical history includes above.  The patient has had symptomatic bradycardia without reversible causes identified.  Risks, benefits, and alternatives to PPM implantation were reviewed with the patient who wished to proceed.   Hospital Course:  The patient was admitted and underwent implantation of a Abbott dual chamber PPM with details as outlined above.  She was monitored on telemetry overnight which demonstrated atrial fibrillation.  Left chest was without hematoma or ecchymosis.  The device was interrogated and found to be functioning normally.  CXR was obtained and demonstrated no pneumothorax status post device implantation.  Wound care, arm mobility, and restrictions were reviewed with the patient.  The patient was examined and considered stable  for discharge to home.    Anticoagulation resumption This patient should resume their Eliquis  on Thursday January 21, 2024    Physical Exam: Vitals:   01/18/24 1830 01/18/24 2030 01/19/24 0049 01/19/24 0818  BP: (!) 143/91  129/77 136/77 124/75  Pulse: 78 78 73 82  Resp:  16 16 17   Temp:  97.7 F (36.5 C) 97.7 F (36.5 C) 97.9 F (36.6 C)  TempSrc:  Oral Oral Oral  SpO2: 97% 95% 95% 96%  Weight:      Height:        GEN- NAD. A&O x 3.  HEENT: Normocephalic, atraumatic Lungs- CTAB, Normal effort.  Heart- RRR, No M/G/R.  GI- Soft, NT, ND.  Extremities- No clubbing, cyanosis, or edema;  Skin- warm and dry, no rash or lesion, left chest without hematoma/ecchymosis  Discharge Medications:  Allergies as of 01/19/2024       Reactions   Azithromycin Swelling   Other reaction(s): swelling   Other Itching, Swelling, Other (See Comments), Cough   Horse products Other reaction(s): reaction Other reaction(s): Unknown Other reaction(s): rash   Penicillins Swelling   Has patient had a PCN reaction causing immediate rash, facial/tongue/throat swelling, SOB or lightheadedness with hypotension: Yes Has patient had a PCN reaction causing severe rash involving mucus membranes or skin necrosis: No Has patient had a PCN reaction that required hospitalization: No Has patient had a PCN reaction occurring within the last 10 years: No If all of the above answers are NO, then may proceed with Cephalosporin use. Other reaction(s): rash Other reaction(s): rash   Tetanus Toxoid-containing Vaccines Swelling   Other reaction(s): sickness   Gadolinium Derivatives Hives, Itching   Pt stated that her left arm was itching and I noticed that she had two hives on her chest. No difficulty breathing, sneezing. One 25mg  of benadryl was ordered by Dr. Trudy. Pt remained at facility and was monitored before going home. -ldrake   Morphine And Codeine Nausea And Vomiting   Cefazolin    Other reaction(s): rash Other reaction(s): rash   Horse-derived Products    Other reaction(s): reaction   Latex    Penicillin G    Other reaction(s): Unknown   Tessalon [benzonatate] Other (See Comments)   hypotension   Amoxicillin-pot  Clavulanate Rash   Has patient had a PCN reaction causing immediate rash, facial/tongue/throat swelling, SOB or lightheadedness with hypotension: Yes Has patient had a PCN reaction causing severe rash involving mucus membranes or skin necrosis: Yes Has patient had a PCN reaction that required hospitalization:No Has patient had a PCN reaction occurring within the last 10 years: No If all of the above answers are NO, then may proceed with Cephalosporin use. Other reaction(s): rash, swelling        Medication List     PAUSE taking these medications    Eliquis  5 MG Tabs tablet Wait to take this until: January 21, 2024 Evening Generic drug: apixaban  Take 1 tablet (5 mg total) by mouth 2 (two) times daily.       TAKE these medications    acetaminophen 650 MG CR tablet Commonly known as: TYLENOL Taking 3 capsules by mouth two times daily   atorvastatin  20 MG tablet Commonly known as: LIPITOR TAKE 1 TABLET BY MOUTH DAILY AT  6 PM What changed:  how much to take when to take this   Biotin 5 MG Caps What changed: additional instructions   diltiazem  30 MG tablet Commonly known as: Cardizem  Take 1 tablet every 4  hours AS NEEDED for AFIB heart rate >110 as long as top BP >100.   hydrocortisone 2.5 % cream SMARTSIG:sparingly Topical Twice Daily   isosorbide  mononitrate 30 MG 24 hr tablet Commonly known as: IMDUR  TAKE 1 TABLET BY MOUTH DAILY   labetalol  200 MG tablet Commonly known as: NORMODYNE  Take 1 tablet (200 mg total) by mouth 2 (two) times daily.   lisinopril  10 MG tablet Commonly known as: ZESTRIL  Take 1 tablet (10 mg total) by mouth daily.   metoprolol  tartrate 25 MG tablet Commonly known as: LOPRESSOR    nitroGLYCERIN  0.4 MG SL tablet Commonly known as: NITROSTAT  DISSOLVE 1 TABLET UNDER THE  TONGUE EVERY 5 MINUTES AS NEEDED FOR CHEST PAIN. MAX OF 3 TABLETS IN 15 MINUTES. CALL 911 IF PAIN  PERSISTS.   Rewetting Drops Soln Place 1 drop 3 (three) times daily  as needed into both eyes (for dry/irritated contact lenses).   spironolactone  25 MG tablet Commonly known as: ALDACTONE  Take 0.5 tablets (12.5 mg total) by mouth daily.   traMADol 50 MG tablet Commonly known as: ULTRAM Take 1 tablet (50 mg total) by mouth every 12 (twelve) hours as needed for up to 5 days for severe pain (pain score 7-10).   Vitamin D3 125 MCG (5000 UT) Caps Take 1 capsule by mouth 2 (two) times daily.        Disposition:  Home with usual follow up as in AVS   Duration of Discharge Encounter:  APP time: 28 minutes  Signed, Ozell Prentice Passey, PA-C  01/19/2024 11:41 AM

## 2024-01-19 NOTE — Progress Notes (Signed)
   CXR and interrogation stable.   Teaching reviewed with patient who is anxious about going home. Will have PT see and repeat teaching once family arrives.   Would plan for home this afternoon if remains stable.   Full note pending disposition.   Ozell Jodie Passey, PA-C  01/19/2024 8:45 AM

## 2024-01-19 NOTE — Progress Notes (Signed)
 DISCHARGE NOTE HOME [name] to be discharged Home per MD order. Discussed prescriptions and follow up appointments with the patient. Prescriptions given to patient; medication list explained in detail. Patient verbalized understanding.  Skin clean, dry and intact without evidence of skin break down, no evidence of skin tears noted. IV catheter discontinued intact. Site without signs and symptoms of complications. Dressing and pressure applied. Pt denies pain at the site currently. No complaints noted.  See Lda for incison this admission. Patient free of lines, drains, and wounds.   An After Visit Summary (AVS) was printed and given to the patient. Patient escorted via wheelchair, and discharged home via private auto.  Peyton SHAUNNA Pepper, RN

## 2024-01-19 NOTE — TOC Transition Note (Signed)
 Transition of Care [facility]) - Discharge Note   Patient Details  Name: [name] MRN: [id number] Date of Birth: [birth year]  Transition of Care [facility]) CM/SW Contact:  Sudie Erminio Deems, RN Phone Number: 01/19/2024, 12:09 PM   Clinical Narrative:    Patient post PPM on 01-18-24 and she will transition home today. No home needs identified at this time.   Social Drivers of Health (SDOH) Interventions SDOH Screenings   Food Insecurity: No Food Insecurity (01/15/2024)  Housing: Low Risk  (01/15/2024)  Transportation Needs: No Transportation Needs (01/15/2024)  Utilities: Not At Risk (01/15/2024)  Social Connections: Unknown (01/15/2024)  Tobacco Use: Low Risk  (01/15/2024)     Readmission Risk Interventions     No data to display

## 2024-01-19 NOTE — Care Management Important Message (Signed)
 Important Message  Patient Details  Name: [name] MRN: [id number] Date of Birth: May 18, 1945   Important Message Given:  Yes - Medicare IM     Vonzell Arrie Sharps 01/19/2024, 11:20 AM

## 2024-01-19 NOTE — Evaluation (Signed)
 Physical Therapy Evaluation Patient Details Name: [name] MRN: [id number] DOB: 07-Aug-1945 Today's Date: 01/19/2024  History of Present Illness  78 y.o. female presents to [facility] hospital on 01/14/2024 for evaluation of pause. Pt with a recent history of multiple syncopal episodes. Pt underwent PPM placement on 01/18/2024. PMH includes afib, CAD s/p PCI, HFpEF, HTN, HLD, prediabetes, esophageal stricture, GERD.  Clinical Impression  Pt presents to PT with deficits in gait, balance, endurance, strength, power. Pt is able to ambulate for household distances at this time. PT encourages use of SPC to improve stability due to chronic gait deviations. PT provides education on PPM precautions regarding LUE utilization. Pt will benefit from frequent mobilization in an effort to improve mobility quality and activity tolerance. PT recommends discharge home when medically appropriate.        If plan is discharge home, recommend the following: A little help with bathing/dressing/bathroom;Assistance with cooking/housework;Assist for transportation;Help with stairs or ramp for entrance   Can travel by private vehicle        Equipment Recommendations None recommended by PT  Recommendations for Other Services       Functional Status Assessment Patient has had a recent decline in their functional status and demonstrates the ability to make significant improvements in function in a reasonable and predictable amount of time.     Precautions / Restrictions Precautions Precautions: Fall;ICD/Pacemaker Recall of Precautions/Restrictions: Intact Restrictions Weight Bearing Restrictions Per Provider Order: Yes LUE Weight Bearing Per Provider Order: Non weight bearing Other Position/Activity Restrictions: PPM precautions      Mobility  Bed Mobility Overal bed mobility: Needs Assistance Bed Mobility: Supine to Sit, Sit to Supine     Supine to sit: Min assist Sit to supine: Contact guard assist         Transfers Overall transfer level: Needs assistance Equipment used: None Transfers: Sit to/from Stand Sit to Stand: Supervision                Ambulation/Gait Ambulation/Gait assistance: Supervision Gait Distance (Feet): 120 Feet Assistive device: None Gait Pattern/deviations: Step-to pattern, Wide base of support Gait velocity: reduced Gait velocity interpretation: <1.8 ft/sec, indicate of risk for recurrent falls   General Gait Details: pt with slowed step-to gait, widened BOS. Pt reports this is typical at baseline.  Stairs            Wheelchair Mobility     Tilt Bed    Modified Rankin (Stroke Patients Only)       Balance Overall balance assessment: Needs assistance Sitting-balance support: No upper extremity supported, Feet supported Sitting balance-Leahy Scale: Good     Standing balance support: No upper extremity supported, During functional activity Standing balance-Leahy Scale: Fair                               Pertinent Vitals/Pain Pain Assessment Pain Assessment: Faces Faces Pain Scale: Hurts little more Pain Location: chest Pain Descriptors / Indicators: Sore Pain Intervention(s): Monitored during session    Home Living Family/patient expects to be discharged to:: Private residence Living Arrangements: Spouse/significant other Available Help at Discharge: Family;Available 24 hours/day (spouse with polio at baseline, recent L elbow injury) Type of Home: House Home Access: Stairs to enter Entrance Stairs-Rails: None Entrance Stairs-Number of Steps: 2   Home Layout: One level Home Equipment: Cane - single point;BSC/3in1;Rollator (4 wheels)      Prior Function Prior Level of Function : Independent/Modified Independent;Driving  Mobility Comments: ambulatory without DME       Extremity/Trunk Assessment   Upper Extremity Assessment Upper Extremity Assessment: LUE deficits/detail LUE Deficits /  Details: LUE in sling, wrist and digits ROM WFL. Shoulder ROM not formally assessed due to PPM precautions. PT encourages active elbow ROM and shoulder rolls to avoid frozen shoulder during time period of PPM restrictions    Lower Extremity Assessment Lower Extremity Assessment: Generalized weakness    Cervical / Trunk Assessment Cervical / Trunk Assessment: Kyphotic  Communication   Communication Communication: No apparent difficulties    Cognition Arousal: Alert Behavior During Therapy: WFL for tasks assessed/performed   PT - Cognitive impairments: No apparent impairments                         Following commands: Intact       Cueing Cueing Techniques: Verbal cues     General Comments General comments (skin integrity, edema, etc.): VSS on RA    Exercises     Assessment/Plan    PT Assessment Patient needs continued PT services  PT Problem List Decreased strength;Decreased activity tolerance;Decreased balance;Decreased mobility;Decreased knowledge of use of DME;Decreased knowledge of precautions       PT Treatment Interventions DME instruction;Gait training;Stair training;Therapeutic activities;Functional mobility training;Therapeutic exercise;Balance training;Neuromuscular re-education;Patient/family education    PT Goals (Current goals can be found in the Care Plan section)  Acute Rehab PT Goals Patient Stated Goal: to go home PT Goal Formulation: With patient Time For Goal Achievement: 02/02/24 Potential to Achieve Goals: Good    Frequency Min 1X/week     Co-evaluation               AM-PAC PT 6 Clicks Mobility  Outcome Measure Help needed turning from your back to your side while in a flat bed without using bedrails?: A Little Help needed moving from lying on your back to sitting on the side of a flat bed without using bedrails?: A Little Help needed moving to and from a bed to a chair (including a wheelchair)?: A Little Help needed  standing up from a chair using your arms (e.g., wheelchair or bedside chair)?: A Little Help needed to walk in hospital room?: A Little Help needed climbing 3-5 steps with a railing? : A Little 6 Click Score: 18    End of Session   Activity Tolerance: Patient tolerated treatment well Patient left: in bed;with call bell/phone within reach Nurse Communication: Mobility status PT Visit Diagnosis: Other abnormalities of gait and mobility (R26.89)    Time: 9163-9144 PT Time Calculation (min) (ACUTE ONLY): 19 min   Charges:   PT Evaluation $PT Eval Low Complexity: 1 Low   PT General Charges $$ ACUTE PT VISIT: 1 Visit         Bernardino JINNY Ruth, PT, DPT Acute Rehabilitation Office [phone]   Bernardino JINNY Ruth 01/19/2024, 9:11 AM

## 2024-01-21 ENCOUNTER — Telehealth: Payer: Self-pay | Admitting: Cardiovascular Disease

## 2024-01-21 NOTE — Telephone Encounter (Signed)
 STAT if patient feels like he/she is going to faint   1. Are you feeling dizzy, lightheaded, or faint right now? yes    2. Have you passed out?  no (If yes move to .[facility])   3. Do you have any other symptoms? fatigue   4. Have you checked your HR and BP (record if available)? 90/63 72, 120/70 72, 112/74,78, 100/74 78 Pt c/o medication issue:  1. Name of Medication:   labetalol  (NORMODYNE ) 200 MG tablet    2. How are you currently taking this medication (dosage and times per day)? As written  3. Are you having a reaction (difficulty breathing--STAT)? No   4. What is your medication issue? Pt says she thought [facility]  told her to stop this medication but she can't be sure. Please advise

## 2024-01-21 NOTE — Telephone Encounter (Signed)
 Spoke to patient she stated she had a pacemaker implant this past Monday 10/6.Stated she has had dizziness but dizziness is worse since pacemaker.B/P today 120/70,90/63 pulse 72.Appointment scheduled with Ozell Passey PA 10/17 at 9:40 am.Advised I will send message to Ozell Passey PA for advice.

## 2024-01-21 NOTE — Telephone Encounter (Signed)
 Spoke to patient Rebecca Passey PA's advice given.She will hold Labetolol.She will hold Lisinopril  for 2 days then restart.Advised to continue to monitor B/P and bring readings and all medications to appointment.

## 2024-01-26 DIAGNOSIS — I4891 Unspecified atrial fibrillation: Secondary | ICD-10-CM | POA: Diagnosis not present

## 2024-01-26 DIAGNOSIS — R634 Abnormal weight loss: Secondary | ICD-10-CM | POA: Diagnosis not present

## 2024-01-26 DIAGNOSIS — Z09 Encounter for follow-up examination after completed treatment for conditions other than malignant neoplasm: Secondary | ICD-10-CM | POA: Diagnosis not present

## 2024-01-26 DIAGNOSIS — R42 Dizziness and giddiness: Secondary | ICD-10-CM | POA: Diagnosis not present

## 2024-01-26 DIAGNOSIS — Z950 Presence of cardiac pacemaker: Secondary | ICD-10-CM | POA: Diagnosis not present

## 2024-01-26 DIAGNOSIS — R001 Bradycardia, unspecified: Secondary | ICD-10-CM | POA: Diagnosis not present

## 2024-01-28 NOTE — H&P (View-Only) (Signed)
  Electrophysiology Office Note:   ID:  Lonia, Roane Jun 12, 1945, MRN [id number]  Primary Cardiologist: Vina Gull, MD Electrophysiologist: Eulas FORBES Furbish, MD      History of Present Illness:   ADARIA HOLE is a 78 y.o. female with h/o paroxysmal AF and interrmitent heart block s/p PPM 01/18/2024 seen today for routine electrophysiology follow-up s/p Pacemaker implant.  Since last being seen in our clinic the patient reports doing poorly. Has felt wiped out. Initially felt it was due to being back on labetolol, but on device check today reveals her AF is VERY rapidly conducting. No chest pain or dyspnea. Very fatigued and dizzy at times with sudden episodes. ? If rapid variance it rates or just pauses after going so fast.   Review of systems complete and found to be negative unless listed in HPI.   EP Information / Studies Reviewed:    EKG is not ordered today. EKG from 01/14/2024 reviewed which showed AF with RVR  PPM from today show's continued AF RVR  PPM Interrogation-  reviewed in detail today,  See PACEART report.  Arrhythmia/Device History Abbott Dual Chamber PPM 01/18/2024 for tachy-brady   Physical Exam:   VS:  There were no vitals taken for this visit.   Wt Readings from Last 3 Encounters:  01/29/24 178 lb 3.2 oz (80.8 kg)  01/15/24 185 lb 3 oz (84 kg)  01/14/24 185 lb 9.6 oz (84.2 kg)     GEN: No acute distress  NECK: No JVD; No carotid bruits CARDIAC: Irregularly irregular rate and rhythm, no murmurs, rubs, gallops RESPIRATORY:  Clear to auscultation without rales, wheezing or rhonchi  ABDOMEN: Soft, non-tender, non-distended EXTREMITIES:  Trace edema; No deformity   ASSESSMENT AND PLAN:    Tachy-Brady syndrome s/p Abbott PPM  Normal PPM function See Pace Art report No changes today  Paroxysmal AF with RVR Rates >120 consistently Continue eliquis  5 mg BID for CHA2DS2/VASc of at least 6 Add toprol  50 mg daily Add amiodarone 200 mg BID. We  discussed multaqs potential high cost and low efficacy, and tikosyn requiring admission. Shared decision to start amiodarone for a short term and pursue Ablation once she is convalesced from PPM implant   Given Eliquis  interruption for PPM, will plan TEE/DCC.   We risks and benefits of transesophageal echocardiogram have been explained including risks of esophageal damage, perforation (1:10,000 risk), bleeding, pharyngeal hematoma as well as other potential complications associated. Alternatives to treatment were discussed, questions were answered. Patient is willing to proceed.   Personally discussed plan with Dr. Inocencio given her rapid HRs and recent pacemaker.   HTN Follow closely with adjustments above  Disposition:   Follow up with EP APP in 2-3 weeks  Signed, Ozell Prentice Passey, PA-C

## 2024-01-28 NOTE — Progress Notes (Signed)
  Electrophysiology Office Note:   ID:  Lonia, Roane Jun 12, 1945, MRN [id number]  Primary Cardiologist: Vina Gull, MD Electrophysiologist: Eulas FORBES Furbish, MD      History of Present Illness:   ADARIA HOLE is a 78 y.o. female with h/o paroxysmal AF and interrmitent heart block s/p PPM 01/18/2024 seen today for routine electrophysiology follow-up s/p Pacemaker implant.  Since last being seen in our clinic the patient reports doing poorly. Has felt wiped out. Initially felt it was due to being back on labetolol, but on device check today reveals her AF is VERY rapidly conducting. No chest pain or dyspnea. Very fatigued and dizzy at times with sudden episodes. ? If rapid variance it rates or just pauses after going so fast.   Review of systems complete and found to be negative unless listed in HPI.   EP Information / Studies Reviewed:    EKG is not ordered today. EKG from 01/14/2024 reviewed which showed AF with RVR  PPM from today show's continued AF RVR  PPM Interrogation-  reviewed in detail today,  See PACEART report.  Arrhythmia/Device History Abbott Dual Chamber PPM 01/18/2024 for tachy-brady   Physical Exam:   VS:  There were no vitals taken for this visit.   Wt Readings from Last 3 Encounters:  01/29/24 178 lb 3.2 oz (80.8 kg)  01/15/24 185 lb 3 oz (84 kg)  01/14/24 185 lb 9.6 oz (84.2 kg)     GEN: No acute distress  NECK: No JVD; No carotid bruits CARDIAC: Irregularly irregular rate and rhythm, no murmurs, rubs, gallops RESPIRATORY:  Clear to auscultation without rales, wheezing or rhonchi  ABDOMEN: Soft, non-tender, non-distended EXTREMITIES:  Trace edema; No deformity   ASSESSMENT AND PLAN:    Tachy-Brady syndrome s/p Abbott PPM  Normal PPM function See Pace Art report No changes today  Paroxysmal AF with RVR Rates >120 consistently Continue eliquis  5 mg BID for CHA2DS2/VASc of at least 6 Add toprol  50 mg daily Add amiodarone 200 mg BID. We  discussed multaqs potential high cost and low efficacy, and tikosyn requiring admission. Shared decision to start amiodarone for a short term and pursue Ablation once she is convalesced from PPM implant   Given Eliquis  interruption for PPM, will plan TEE/DCC.   We risks and benefits of transesophageal echocardiogram have been explained including risks of esophageal damage, perforation (1:10,000 risk), bleeding, pharyngeal hematoma as well as other potential complications associated. Alternatives to treatment were discussed, questions were answered. Patient is willing to proceed.   Personally discussed plan with Dr. Inocencio given her rapid HRs and recent pacemaker.   HTN Follow closely with adjustments above  Disposition:   Follow up with EP APP in 2-3 weeks  Signed, Ozell Prentice Passey, PA-C

## 2024-01-29 ENCOUNTER — Ambulatory Visit: Attending: Internal Medicine | Admitting: Internal Medicine

## 2024-01-29 ENCOUNTER — Encounter: Payer: Self-pay | Admitting: Student

## 2024-01-29 ENCOUNTER — Ambulatory Visit: Attending: Student | Admitting: Student

## 2024-01-29 VITALS — BP 110/70 | HR 77 | Ht 62.0 in | Wt 178.2 lb

## 2024-01-29 VITALS — BP 110/70 | HR 120

## 2024-01-29 DIAGNOSIS — I10 Essential (primary) hypertension: Secondary | ICD-10-CM

## 2024-01-29 DIAGNOSIS — Z9189 Other specified personal risk factors, not elsewhere classified: Secondary | ICD-10-CM

## 2024-01-29 DIAGNOSIS — R001 Bradycardia, unspecified: Secondary | ICD-10-CM | POA: Diagnosis not present

## 2024-01-29 DIAGNOSIS — I4891 Unspecified atrial fibrillation: Secondary | ICD-10-CM

## 2024-01-29 DIAGNOSIS — I4819 Other persistent atrial fibrillation: Secondary | ICD-10-CM | POA: Diagnosis not present

## 2024-01-29 DIAGNOSIS — Z79899 Other long term (current) drug therapy: Secondary | ICD-10-CM

## 2024-01-29 DIAGNOSIS — I5032 Chronic diastolic (congestive) heart failure: Secondary | ICD-10-CM

## 2024-01-29 LAB — CUP PACEART INCLINIC DEVICE CHECK
Battery Remaining Longevity: 104 mo
Battery Voltage: 2.95 V
Brady Statistic RA Percent Paced: 0 %
Brady Statistic RV Percent Paced: 17 %
Date Time Interrogation Session: 20251017102835
Implantable Lead Connection Status: 753985
Implantable Lead Connection Status: 753985
Implantable Lead Implant Date: 20251006
Implantable Lead Implant Date: 20251006
Implantable Lead Location: 753859
Implantable Lead Location: 753860
Implantable Pulse Generator Implant Date: 20251006
Lead Channel Impedance Value: 487.5 Ohm
Lead Channel Impedance Value: 612.5 Ohm
Lead Channel Sensing Intrinsic Amplitude: 11.9 mV
Lead Channel Sensing Intrinsic Amplitude: 2.2 mV
Lead Channel Setting Pacing Amplitude: 3.5 V
Lead Channel Setting Pacing Amplitude: 3.5 V
Lead Channel Setting Pacing Pulse Width: 0.5 ms
Lead Channel Setting Sensing Sensitivity: 2 mV
Pulse Gen Model: 2272
Pulse Gen Serial Number: 8276807

## 2024-01-29 MED ORDER — AMIODARONE HCL 200 MG PO TABS: 200.0000 mg | ORAL_TABLET | Freq: Two times a day (BID) | ORAL | 3 refills | Status: DC

## 2024-01-29 MED ORDER — METOPROLOL SUCCINATE ER 50 MG PO TB24: 50.0000 mg | ORAL_TABLET | Freq: Every day | ORAL | 3 refills | Status: DC

## 2024-01-29 NOTE — Patient Instructions (Signed)
 Medication Instructions:  Your physician recommends that you continue on your current medications as directed. Please refer to the Current Medication list given to you today.  *If you need a refill on your cardiac medications before your next appointment, please call your pharmacy*  Lab Work: None ordered today. If you have labs (blood work) drawn today and your tests are completely normal, you will receive your results only by: MyChart Message (if you have MyChart) OR A paper copy in the mail If you have any lab test that is abnormal or we need to change your treatment, we will call you to review the results.  Testing/Procedures: None ordered today.  Follow-Up: At [facility], you and your health needs are our priority.  As part of our continuing mission to provide you with exceptional heart care, our providers are all part of one team.  This team includes your primary Cardiologist (physician) and Advanced Practice Providers or APPs (Physician Assistants and Nurse Practitioners) who all work together to provide you with the care you need, when you need it.  Your next appointment:   To be determined  Provider:   Vina Gull, MD  We recommend signing up for the patient portal called MyChart.  Sign up information is provided on this After Visit Summary.  MyChart is used to connect with patients for Virtual Visits (Telemedicine).  Patients are able to view lab/test results, encounter notes, upcoming appointments, etc.  Non-urgent messages can be sent to your provider as well.    To learn more about what you can do with MyChart, go to [email or web address].

## 2024-01-29 NOTE — Progress Notes (Unsigned)
 [name]   Date:  01/29/2024   ID:  [name], [name] [name] [name], 1947, MRN [id number]  PCP:  Rebecca Motto, DO  Cardiologist:   Rebecca Gull, MD  Pt presents for follow up of CAD   History of Present Illness: [name] is a 78 y.o. female with a history of CAD, PAF, HFpEF, chronic headaches esophageal strictures and severe arthritis     Pt had PCIs in 1999 and 2002. Last LHC in 2015 showed no significant CAD     Pt also has a hx of headaches.   The pt was previously followed by [name] in clinic    Since seen she denies CP   Breathing is good   Has noted occasional heart fluttering when in bed.   Can last hours   No dizziness   No SOB Pt says her BP is always higher at home      Arthritis in legs, feet severely limit activity   She is eager to taper meds   I'm on too many  I saw the pt in Dec 2024  July 2025   Found to be in atrail fultter    Started on ELiquis     Aug 2025  Underwent DCCV   Reverted to afib within 2 days    Plan was for antiarrhythmic but pt missed [name] Aug 2025  Echo showed LVEF and RVEF normal   Severe LAE  Mild RAE   MVA 1.06 cm2  Mean gradient 8.7   Severe annular calcifications  Sept 24 2025   Had syncopal spells with quick standing     At 10/2 clinic visit BP low, dilt went to PRN   Set up for Zio patch    Monitor on 10/2 showed 12 second pause  [name] 6 2025    Pt admitted  and underwent PPM   SInce d/c from the hospital she has felt blah  Says she did not feel this way during summer or even right before PPM   Notes some dizziness   NO syncope   She denies palpitations  No CP     Breathing is OK BP at home   90 to 150/   Some readings as ERR on device       Current Meds  Medication Sig   acetaminophen (TYLENOL) 650 MG CR tablet Taking 3 capsules by mouth two times daily   atorvastatin  (LIPITOR) 20 MG tablet TAKE 1 TABLET BY MOUTH DAILY AT  6 PM (Patient taking differently: Take 10 mg by mouth daily.)   Biotin 5 MG CAPS     Cholecalciferol (VITAMIN D3) 125 MCG (5000 UT) CAPS Take 1 capsule by mouth 2 (two) times daily.   diltiazem  (CARDIZEM ) 30 MG tablet Take 1 tablet every 4 hours AS NEEDED for AFIB heart rate >110 as long as top BP >100.   ELIQUIS  5 MG TABS tablet Take 1 tablet (5 mg total) by mouth 2 (two) times daily.   isosorbide  mononitrate (IMDUR ) 30 MG 24 hr tablet TAKE 1 TABLET BY MOUTH DAILY   lisinopril  (ZESTRIL ) 10 MG tablet Take 1 tablet (10 mg total) by mouth daily.   nitroGLYCERIN  (NITROSTAT ) 0.4 MG SL tablet DISSOLVE 1 TABLET UNDER THE  TONGUE EVERY 5 MINUTES AS NEEDED FOR CHEST PAIN. MAX OF 3 TABLETS IN 15 MINUTES. CALL 911 IF PAIN  PERSISTS.   Soft Lens Products (REWETTING DROPS) SOLN Place 1 drop 3 (three) times daily as needed into both  eyes (for dry/irritated contact lenses).   spironolactone  (ALDACTONE ) 25 MG tablet Take 0.5 tablets (12.5 mg total) by mouth daily.     Allergies:   Azithromycin, Other, Penicillins, Tetanus toxoid-containing vaccines, Gadolinium derivatives, Morphine and codeine, Cefazolin, Horse-derived products, Latex, Penicillin g, Tessalon [benzonatate], and Amoxicillin-pot clavulanate   Past Medical History:  Diagnosis Date   Anxiety    Arthritis    Bone spur    Coronary artery disease    stent - 1999   Depression    Fibrocystic breast changes    GERD (gastroesophageal reflux disease)    Glucosuria    Hernia, inguinal, left 10/2002   Hypertension    Melanoma (HCC) 10/2008   right shoulder and arm   Menorrhagia    Myocardial infarction (HCC) 1999    Past Surgical History:  Procedure Laterality Date   ANGIOPLASTY  2000   APPENDECTOMY     bone chip removed from left foot      CARDIOVERSION N/A 11/18/2023   Procedure: CARDIOVERSION;  Surgeon: Rebecca Rebecca Ned, MD;  Location: [facility] INVASIVE CV LAB;  Service: Cardiovascular;  Laterality: N/A;   CHOLECYSTECTOMY     COLONOSCOPY WITH PROPOFOL  N/A 03/03/2017   Procedure: COLONOSCOPY WITH PROPOFOL ;  Surgeon: Rebecca Lamprey, MD;  Location: [facility] ENDOSCOPY;  Service: Endoscopy;  Laterality: N/A;   CORONARY ANGIOPLASTY WITH STENT PLACEMENT  10/99   ESOPHAGOGASTRODUODENOSCOPY (EGD) WITH PROPOFOL  N/A 03/03/2017   Procedure: ESOPHAGOGASTRODUODENOSCOPY (EGD) WITH PROPOFOL ;  Surgeon: Rebecca Lamprey, MD;  Location: [facility];  Service: Endoscopy;  Laterality: N/A;   HERNIA REPAIR     umbilical    HYSTEROSCOPY  2/98   D&C (polyps)   LEFT HEART CATHETERIZATION WITH CORONARY ANGIOGRAM N/A 01/25/2014   Procedure: LEFT HEART CATHETERIZATION WITH CORONARY ANGIOGRAM;  Surgeon: Rebecca GORMAN Reek, MD;  Location: [facility] CATH LAB;  Service: Cardiovascular;  Laterality: N/A;   NODE DISSECTION     neg   PACEMAKER IMPLANT N/A 01/18/2024   Procedure: PACEMAKER IMPLANT;  Surgeon: Rebecca Rebecca BRAVO, MD;  Location: [facility] INVASIVE CV LAB;  Service: Cardiovascular;  Laterality: N/A;   TOTAL ABDOMINAL HYSTERECTOMY     LSO     Failed TVH     Social History:  The patient  reports that she has never smoked. She has never used smokeless tobacco. She reports current alcohol use of about 1.0 standard drink of alcohol per week. She reports that she does not use drugs.   Family History:  The patient's family history includes Cancer in her father; Diabetes in her mother; Heart attack in her father; Heart disease in her father; Hypertension in her father and mother; Stroke in her mother.    ROS:  Please see the history of present illness. All other systems are reviewed and  Negative to the above problem except as noted.    PHYSICAL EXAM: VS:  BP 110/70   Pulse 77   Ht 5' 2 (1.575 m)   Wt 178 lb 3.2 oz (80.8 kg)   SpO2 99%   BMI 32.59 kg/m   GEN: Obese 78 yo  in no acute distress  HEENT: normal  Neck: no JVD, carotid bruits Cardiac: Irreg irreg  No S3  No murmurs  Chest  Pacer site is clean, dry Respiratory:  clear to auscultation bilaterally GI: soft, nontender No hepatomegaly  Ext   NO LE edema  EKG:  EKG is not done    Echo  12/02/23: 1. Left ventricular ejection fraction, by estimation, is 60 to 65%.  The  left ventricle has normal function. The left ventricle has no regional  wall motion abnormalities. Left ventricular diastolic function could not  be evaluated.   2. Right ventricular systolic function is normal. The right ventricular  size is mildly enlarged. There is normal pulmonary artery systolic  pressure. The estimated right ventricular systolic pressure is 30.0 mmHg.   3. Left atrial size was severely dilated.   4. Right atrial size was mildly dilated.   5. MVA 1.06 cm2 by continuity. The mitral valve is degenerative. Mild  mitral valve regurgitation. Moderate to severe mitral stenosis. The mean  mitral valve gradient is 8.7 mmHg with average heart rate of 90 bpm.  Severe mitral annular calcification.   6. The aortic valve is tricuspid. Aortic valve regurgitation is not  visualized. No aortic stenosis is present.   7. The inferior vena cava is normal in size with greater than 50%  respiratory variability, suggesting right atrial pressure of 3 mmHg.   Comparison(s): Changes from prior study are noted. LVEF unchanged. MV  stenosis is moderate to severe.   Lipid Panel    Component Value Date/Time   CHOL 105 01/15/2024 0044   TRIG 76 01/15/2024 0044   HDL 51 01/15/2024 0044   CHOLHDL 2.1 01/15/2024 0044   VLDL 15 01/15/2024 0044   LDLCALC 39 01/15/2024 0044      Wt Readings from Last 3 Encounters:  01/29/24 178 lb 3.2 oz (80.8 kg)  01/15/24 185 lb 3 oz (84 kg)  01/14/24 185 lb 9.6 oz (84.2 kg)      ASSESSMENT AND PLAN:  1 Atrial fibrillation  Pt clinically remains in afib    Now s/p PPM for long pause   Pacer site looks good   SHe says she feels blah since recent discharge No change in meds    SHe has dizziness but no syncope  BP is labile (some may be because of afib) She is due to see A Tillery today  who can  interrogate device  ? If related to afib rates     Review of echo I do  not think pt would maintain SR with MS and severe LAE    2  HTN BP is labile 90 to 150s   Follow   3  CAD   LHC in 2015  Patent stents to mid LAD with only mild instent restenosis.   Mild dz elsewhere    LVEF normal   Pt without angina   Follow risk factors  4  Hx of HFpEF   Volume status looks good   5  Mitral stenosis  Severe on recent echo with severe annular calcification, severe LAE REcomm:  Will need to follow   Lungs CTA  today  REcomm HR control  for now   5  CKD  =  Cr 1.05 a few days ago   6  HL  PT on lipitor  LDL 39  HDL 51  Trig 76  6  Metabolic   Remote A1C of 6     Follow up based on findings in EP clinic with A Tillery   Current medicines are reviewed at length with the patient today.  The patient does not have concerns regarding medicines.  Signed, Rebecca Gull, MD  01/29/2024 9:56 AM    [facility] [address] [place], [place], [place]  [address] Phone: [phone]; Fax: [phone]      [name]   Date:  01/29/2024  ID:  [name], DOB 07/22/1945, MRN [id number]  PCP:  Rebecca Motto, DO  Pt presetns for follow up of CAD  Wt Readings from Last 3 Encounters:  01/29/24 178 lb 3.2 oz (80.8 kg)  01/15/24 185 lb 3 oz (84 kg)  01/14/24 185 lb 9.6 oz (84.2 kg)       History of Present Illness: [name] is a 78 y.o. female   CAD. She had PCI in 1999 and 2002.  She had a cath without obstructive disease in 2015.   She has had a chronic headache and is now seeing neuro.   She had acute diastolic heart failure after a trip to [place]  and [place] .  She returned in March 2020.  She was tested for COVID and this was negative.   She was diuresed 20 lbs.  She may have eaten more salt.  Troponin was negative.  BNP was elevated.    She was discharged in Lasix  20 mg daily, discharged from [facility] at the end of March 2020.    She used Biotin for hair loss.    In 08/2018, Some high BP readings, up to the 160s.   At last visit, plan was : Increase amlopdine to 10 mg daily.   A few weeks later: She will move amlodipine  and lisinopril  to evening.  She will call in with readings.  If too low in AM, move amlodipine  back to AM.     At 01/14/2019 visit, plan was :Add spironolactone  25 mg daily.  Continue lasix  daily.  BMet next week.   Recheck in a few weeks.  If spironolactone  helps get BP to therapeutic range, would stop clonidine . Refill Lasix  for daily use.    Lost weight in 2021.    In 2022: She has decreasing stamina.  Gets very fatigued at the end of the day.  Walking has decreased due to pain.  She does now have some shortness of breath that is worse than before. Had a recent episode of chest pressure requiring 2 SL NTG.  BP has been low at times.  Lowest 97/59. Did not feel well those days.   Denies : Chest pain. Dizziness. Leg edema. Nitroglycerin  use. Orthopnea. Palpitations. Paroxysmal nocturnal dyspnea. Shortness of breath. Syncope.    Needs some esophageal stretching.  Has some pressure in her chest with lying down.  No exertional chest tightness.  Arthritis limits walking.   The pt wa previously seen by [name]  Couple episodes of heart flutterin in bed  Just a couple times   HOurs   No dizziness   No SOB  No CP      Pt has arthritis  Knees, feet, hips     Past Medical History:  Diagnosis Date   Anxiety    Arthritis    Bone spur    Coronary artery disease    stent - 1999   Depression    Fibrocystic breast changes    GERD (gastroesophageal reflux disease)    Glucosuria    Hernia, inguinal, left 10/2002   Hypertension    Melanoma (HCC) 10/2008   right shoulder and arm   Menorrhagia    Myocardial infarction (HCC) 1999    Past Surgical History:  Procedure Laterality Date   ANGIOPLASTY  2000   APPENDECTOMY     bone chip removed from left foot      CARDIOVERSION N/A 11/18/2023   Procedure: CARDIOVERSION;  Surgeon: Rebecca Rebecca Ned, MD;  Location: [facility] INVASIVE CV  LAB;  Service: Cardiovascular;  Laterality: N/A;   CHOLECYSTECTOMY     COLONOSCOPY WITH PROPOFOL  N/A 03/03/2017   Procedure: COLONOSCOPY WITH PROPOFOL ;  Surgeon: Rebecca Lamprey, MD;  Location: [facility] ENDOSCOPY;  Service: Endoscopy;  Laterality: N/A;   CORONARY ANGIOPLASTY WITH STENT PLACEMENT  10/99   ESOPHAGOGASTRODUODENOSCOPY (EGD) WITH PROPOFOL  N/A 03/03/2017   Procedure: ESOPHAGOGASTRODUODENOSCOPY (EGD) WITH PROPOFOL ;  Surgeon: Rebecca Lamprey, MD;  Location: [facility]OS[facility];  Service: Endoscopy;  Laterality: N/A;   HERNIA REPAIR     umbilical    HYSTEROSCOPY  2/98   D&C (polyps)   LEFT HEART CATHETERIZATION WITH CORONARY ANGIOGRAM N/A 01/25/2014   Procedure: LEFT HEART CATHETERIZATION WITH CORONARY ANGIOGRAM;  Surgeon: Rebecca GORMAN Reek, MD;  Location: [facility] CATH LAB;  Service: Cardiovascular;  Laterality: N/A;   NODE DISSECTION     neg   PACEMAKER IMPLANT N/A 01/18/2024   Procedure: PACEMAKER IMPLANT;  Surgeon: Rebecca Rebecca BRAVO, MD;  Location: [facility] INVASIVE CV LAB;  Service: Cardiovascular;  Laterality: N/A;   TOTAL ABDOMINAL HYSTERECTOMY     LSO     Failed TVH     Current Outpatient Medications  Medication Sig Dispense Refill   acetaminophen (TYLENOL) 650 MG CR tablet Taking 3 capsules by mouth two times daily     atorvastatin  (LIPITOR) 20 MG tablet TAKE 1 TABLET BY MOUTH DAILY AT  6 PM (Patient taking differently: Take 10 mg by mouth daily.) 100 tablet 1   Biotin 5 MG CAPS      Cholecalciferol (VITAMIN D3) 125 MCG (5000 UT) CAPS Take 1 capsule by mouth 2 (two) times daily.     diltiazem  (CARDIZEM ) 30 MG tablet Take 1 tablet every 4 hours AS NEEDED for AFIB heart rate >110 as long as top BP >100. 45 tablet 1   ELIQUIS  5 MG TABS tablet Take 1 tablet (5 mg total) by mouth 2 (two) times daily. 200 tablet 2   isosorbide  mononitrate (IMDUR ) 30 MG 24 hr tablet TAKE 1 TABLET BY MOUTH DAILY 100 tablet 1   lisinopril  (ZESTRIL ) 10 MG tablet Take 1 tablet (10 mg total) by mouth daily. 90 tablet 3    nitroGLYCERIN  (NITROSTAT ) 0.4 MG SL tablet DISSOLVE 1 TABLET UNDER THE  TONGUE EVERY 5 MINUTES AS NEEDED FOR CHEST PAIN. MAX OF 3 TABLETS IN 15 MINUTES. CALL 911 IF PAIN  PERSISTS. 75 tablet 3   Soft Lens Products (REWETTING DROPS) SOLN Place 1 drop 3 (three) times daily as needed into both eyes (for dry/irritated contact lenses).     spironolactone  (ALDACTONE ) 25 MG tablet Take 0.5 tablets (12.5 mg total) by mouth daily. 50 tablet 1   No current facility-administered medications for this visit.    Allergies:   Azithromycin, Other, Penicillins, Tetanus toxoid-containing vaccines, Gadolinium derivatives, Morphine and codeine, Cefazolin, Horse-derived products, Latex, Penicillin g, Tessalon [benzonatate], and Amoxicillin-pot clavulanate    Social History:  The patient  reports that she has never smoked. She has never used smokeless tobacco. She reports current alcohol use of about 1.0 standard drink of alcohol per week. She reports that she does not use drugs.   Family History:  The patient's family history includes Cancer in her father; Diabetes in her mother; Heart attack in her father; Heart disease in her father; Hypertension in her father and mother; Stroke in her mother.    ROS:  Please see the history of present illness.   Otherwise, review of systems are positive for knee pain.   All other systems are reviewed and  negative.    PHYSICAL EXAM: VS:  BP 110/70   Pulse 77   Ht 5' 2 (1.575 m)   Wt 178 lb 3.2 oz (80.8 kg)   SpO2 99%   BMI 32.59 kg/m  , BMI Body mass index is 32.59 kg/m. GEN: Well nourished, well developed, in no acute distress HEENT: normal Neck: no JVD, carotid bruits, or masses Cardiac: RRR; no murmurs, rubs, or gallops,no edema  Respiratory:  clear to auscultation bilaterally, normal work of breathing GI: soft, nontender, nondistended, + BS MS: no deformity or atrophy Skin: warm and dry, no rash Neuro:  Strength and sensation are intact Psych: euthymic mood,  full affect   EKG:   The ekg ordered today demonstrates normal ECG   Recent Labs: 01/15/2024: ALT 19; TSH 1.232 01/19/2024: BUN 14; Creatinine, Ser 1.05; Hemoglobin 14.4; Magnesium  1.4; Platelets 182; Potassium 3.9; Sodium 139   Lipid Panel    Component Value Date/Time   CHOL 105 01/15/2024 0044   TRIG 76 01/15/2024 0044   HDL 51 01/15/2024 0044   CHOLHDL 2.1 01/15/2024 0044   VLDL 15 01/15/2024 0044   LDLCALC 39 01/15/2024 0044     Other studies Reviewed: Additional studies/ records that were reviewed today with results demonstrating: labs reviewed.   ASSESSMENT AND PLAN:  Atrial fibrillation  CAD: No angina.  Continue aggressive secondary prevention.  Increase activity to 30 minutes a day 5 days a week. Hypertension: The current medical regimen is effective;  continue present plan and medications.  Chronic diastolic heart failure: appears euvolemic.   Hyperlipidemia: LDL 67 in 11/23.  PreDM: 6.0 in 2022.  Fasting glucose 92 in 2023.    Current medicines are reviewed at length with the patient today.  The patient concerns regarding her medicines were addressed.  The following changes have been made:  No change  Labs/ tests ordered today include:   No orders of the defined types were placed in this encounter.   Recommend 150 minutes/week of aerobic exercise Low fat, low carb, high fiber diet recommended  Disposition:   FU in 1 year   Signed, Rebecca Gull, MD  01/29/2024 9:56 AM    [facility] [address] [place], [place], [place]  [address] Phone: [phone]; Fax: [phone]

## 2024-01-29 NOTE — Patient Instructions (Addendum)
 Medication Instructions:  1.Start amiodarone 200 mg twice daily 2.Start metoprolol  succinate (Toprol  XL) 50 mg daily *If you need a refill on your cardiac medications before your next appointment, please call your pharmacy*  Lab Work: CMET, CBC-TODAY If you have labs (blood work) drawn today and your tests are completely normal, you will receive your results only by: MyChart Message (if you have MyChart) OR A paper copy in the mail If you have any lab test that is abnormal or we need to change your treatment, we will call you to review the results.  Testing/Procedures: You are scheduled for a TEE (Transesophageal Echocardiogram) Guided Cardioversion on Thursday, October 23 with Dr. Francyne.  Please arrive at the [facility] (Main Entrance A) at [facility]: [address] [place], [place] [address] at 9:00 AM (This time is 1 hour(s) before your procedure to ensure your preparation).   Free valet parking service is available. You will check in at ADMITTING.   *Please Note: You will receive a call the day before your procedure to confirm the appointment time. That time may have changed from the original time based on the schedule for that day.*   DIET:  Nothing to eat or drink after midnight except a sip of water with medications (see medication instructions below)  MEDICATION INSTRUCTIONS: !!IF ANY NEW MEDICATIONS ARE STARTED AFTER TODAY, PLEASE NOTIFY YOUR PROVIDER AS SOON AS POSSIBLE!!  FYI: Medications such as Semaglutide (Ozempic, [place]), Tirzepatide (Mounjaro, Zepbound), Dulaglutide (Trulicity), etc (GLP1 agonists) AND Canagliflozin (Invokana), Dapagliflozin (Farxiga), Empagliflozin (Jardiance), Ertugliflozin (Steglatro), Bexagliflozin [employer]) or any combination with one of these drugs such as Invokamet (Canagliflozin/Metformin), Synjardy (Empagliflozin/Metformin), etc (SGLT2 inhibitors) must be held around the time of a procedure. This is not a comprehensive list of all  of these drugs. Please review all of your medications and talk to your provider if you take any one of these. If you are not sure, ask your provider.   Continue taking your anticoagulant (blood thinner): Apixaban  (Eliquis ).  You will need to continue this after your procedure until you are told by your provider that it is safe to stop.    LABS:CMET, CBC-TODAY  FYI:  For your safety, and to allow us  to monitor your vital signs accurately during the surgery/procedure we request: If you have artificial nails, gel coating, SNS etc, please have those removed prior to your surgery/procedure. Not having the nail coverings /polish removed may result in cancellation or delay of your surgery/procedure.  Your support person will be asked to wait in the waiting room during your procedure.  It is OK to have someone drop you off and come back when you are ready to be discharged.  You cannot drive after the procedure and will need someone to drive you home.  Bring your insurance cards.  *Special Note: Every effort is made to have your procedure done on time. Occasionally there are emergencies that occur at the hospital that may cause delays. Please be patient if a delay does occur.    Follow-Up: At [facility], you and your health needs are our priority.  As part of our continuing mission to provide you with exceptional heart care, our providers are all part of one team.  This team includes your primary Cardiologist (physician) and Advanced Practice Providers or APPs (Physician Assistants and Nurse Practitioners) who all work together to provide you with the care you need, when you need it.  Your next appointment:   2-3 week(s)  Provider:  Ozell Jodie Passey, PA-C

## 2024-01-30 LAB — CBC
Hematocrit: 47 % — ABNORMAL HIGH (ref 34.0–46.6)
Hemoglobin: 15 g/dL (ref 11.1–15.9)
MCH: 32.1 pg (ref 26.6–33.0)
MCHC: 31.9 g/dL (ref 31.5–35.7)
MCV: 100 fL — ABNORMAL HIGH (ref 79–97)
Platelets: 271 x10E3/uL (ref 150–450)
RBC: 4.68 x10E6/uL (ref 3.77–5.28)
RDW: 12.4 % (ref 11.7–15.4)
WBC: 8 x10E3/uL (ref 3.4–10.8)

## 2024-01-30 LAB — COMPREHENSIVE METABOLIC PANEL WITH GFR
ALT: 29 IU/L (ref 0–32)
AST: 24 IU/L (ref 0–40)
Albumin: 4.9 g/dL — AB (ref 3.8–4.8)
Alkaline Phosphatase: 104 IU/L (ref 49–135)
BUN/Creatinine Ratio: 21 (ref 12–28)
BUN: 26 mg/dL (ref 8–27)
Bilirubin Total: 0.7 mg/dL (ref 0.0–1.2)
CO2: 20 mmol/L (ref 20–29)
Calcium: 10.3 mg/dL (ref 8.7–10.3)
Chloride: 101 mmol/L (ref 96–106)
Creatinine, Ser: 1.23 mg/dL — AB (ref 0.57–1.00)
Globulin, Total: 2.3 g/dL (ref 1.5–4.5)
Glucose: 97 mg/dL (ref 70–99)
Potassium: 4.3 mmol/L (ref 3.5–5.2)
Sodium: 138 mmol/L (ref 134–144)
Total Protein: 7.2 g/dL (ref 6.0–8.5)
eGFR: 45 mL/min/1.73 — AB (ref >59)

## 2024-01-31 ENCOUNTER — Ambulatory Visit: Payer: Self-pay | Admitting: Student

## 2024-02-02 ENCOUNTER — Ambulatory Visit

## 2024-02-03 ENCOUNTER — Ambulatory Visit: Admitting: Podiatry

## 2024-02-03 DIAGNOSIS — B351 Tinea unguium: Secondary | ICD-10-CM | POA: Diagnosis not present

## 2024-02-03 DIAGNOSIS — M79675 Pain in left toe(s): Secondary | ICD-10-CM | POA: Diagnosis not present

## 2024-02-03 DIAGNOSIS — M79674 Pain in right toe(s): Secondary | ICD-10-CM

## 2024-02-03 NOTE — Progress Notes (Signed)
 Pt called for pre procedure instructions. Arrival time 0900 NPO after midnight explained Instructed to take am meds with sip of water and confirmed blood thinner consistency. Instructed pt need for ride home tomorrow and have responsible adult with them for 24 hrs post procedure.

## 2024-02-04 ENCOUNTER — Ambulatory Visit (HOSPITAL_COMMUNITY)

## 2024-02-04 ENCOUNTER — Other Ambulatory Visit (HOSPITAL_COMMUNITY): Payer: Self-pay | Admitting: *Deleted

## 2024-02-04 ENCOUNTER — Encounter (HOSPITAL_COMMUNITY): Admission: RE | Disposition: A | Payer: Self-pay | Attending: Cardiovascular Disease

## 2024-02-04 ENCOUNTER — Ambulatory Visit (HOSPITAL_COMMUNITY): Admitting: Anesthesiology

## 2024-02-04 ENCOUNTER — Ambulatory Visit (HOSPITAL_COMMUNITY)
Admission: RE | Admit: 2024-02-04 | Discharge: 2024-02-04 | Disposition: A | Discharge status: Home / Self Care | Attending: Cardiovascular Disease | Admitting: Cardiovascular Disease

## 2024-02-04 ENCOUNTER — Encounter (HOSPITAL_COMMUNITY): Payer: Self-pay | Admitting: Cardiovascular Disease

## 2024-02-04 ENCOUNTER — Other Ambulatory Visit: Payer: Self-pay

## 2024-02-04 DIAGNOSIS — Z7901 Long term (current) use of anticoagulants: Secondary | ICD-10-CM | POA: Insufficient documentation

## 2024-02-04 DIAGNOSIS — I10 Essential (primary) hypertension: Secondary | ICD-10-CM | POA: Diagnosis not present

## 2024-02-04 DIAGNOSIS — I4819 Other persistent atrial fibrillation: Secondary | ICD-10-CM | POA: Insufficient documentation

## 2024-02-04 DIAGNOSIS — Z950 Presence of cardiac pacemaker: Secondary | ICD-10-CM | POA: Insufficient documentation

## 2024-02-04 DIAGNOSIS — E785 Hyperlipidemia, unspecified: Secondary | ICD-10-CM | POA: Diagnosis not present

## 2024-02-04 DIAGNOSIS — K219 Gastro-esophageal reflux disease without esophagitis: Secondary | ICD-10-CM | POA: Diagnosis not present

## 2024-02-04 DIAGNOSIS — Z79899 Other long term (current) drug therapy: Secondary | ICD-10-CM | POA: Insufficient documentation

## 2024-02-04 DIAGNOSIS — I050 Rheumatic mitral stenosis: Secondary | ICD-10-CM | POA: Diagnosis not present

## 2024-02-04 DIAGNOSIS — I2510 Atherosclerotic heart disease of native coronary artery without angina pectoris: Secondary | ICD-10-CM | POA: Diagnosis not present

## 2024-02-04 DIAGNOSIS — G4730 Sleep apnea, unspecified: Secondary | ICD-10-CM | POA: Insufficient documentation

## 2024-02-04 DIAGNOSIS — N189 Chronic kidney disease, unspecified: Secondary | ICD-10-CM | POA: Diagnosis not present

## 2024-02-04 DIAGNOSIS — I252 Old myocardial infarction: Secondary | ICD-10-CM | POA: Insufficient documentation

## 2024-02-04 DIAGNOSIS — I495 Sick sinus syndrome: Secondary | ICD-10-CM | POA: Diagnosis not present

## 2024-02-04 DIAGNOSIS — F418 Other specified anxiety disorders: Secondary | ICD-10-CM | POA: Diagnosis not present

## 2024-02-04 DIAGNOSIS — I129 Hypertensive chronic kidney disease with stage 1 through stage 4 chronic kidney disease, or unspecified chronic kidney disease: Secondary | ICD-10-CM | POA: Insufficient documentation

## 2024-02-04 DIAGNOSIS — I513 Intracardiac thrombosis, not elsewhere classified: Secondary | ICD-10-CM | POA: Insufficient documentation

## 2024-02-04 HISTORY — PX: TRANSESOPHAGEAL ECHOCARDIOGRAM (CATH LAB): EP1270

## 2024-02-04 LAB — ECHO TEE

## 2024-02-04 SURGERY — TRANSESOPHAGEAL ECHOCARDIOGRAM (TEE) (CATHLAB)
Anesthesia: Monitor Anesthesia Care

## 2024-02-04 MED ORDER — PROPOFOL 10 MG/ML IV BOLUS
INTRAVENOUS | Status: DC | PRN
  Administered 2024-02-04: 100 mg via INTRAVENOUS

## 2024-02-04 MED ORDER — LIDOCAINE 2% (20 MG/ML) 5 ML SYRINGE
INTRAMUSCULAR | Status: DC | PRN
  Administered 2024-02-04: 100 mg via INTRAVENOUS

## 2024-02-04 MED ORDER — PERFLUTREN LIPID MICROSPHERE
INTRAVENOUS | Status: DC | PRN
  Administered 2024-02-04: 2 mL via INTRAVENOUS

## 2024-02-04 MED ORDER — SODIUM CHLORIDE 0.9 % IV SOLN: INTRAVENOUS | Status: DC

## 2024-02-04 MED ORDER — PROPOFOL 500 MG/50ML IV EMUL
INTRAVENOUS | Status: DC | PRN
  Administered 2024-02-04: 50 ug/kg/min via INTRAVENOUS

## 2024-02-04 SURGICAL SUPPLY — 1 items: PAD DEFIB RADIO PHYSIO CONN (PAD) ×1 IMPLANT

## 2024-02-04 NOTE — Interval H&P Note (Signed)
 History and Physical Interval Note:  02/04/2024 9:41 AM  [name]  has presented today for surgery, with the diagnosis of AFIB.  The various methods of treatment have been discussed with the patient and family. After consideration of risks, benefits and other options for treatment, the patient has consented to  Procedure(s): TRANSESOPHAGEAL ECHOCARDIOGRAM (N/A) CARDIOVERSION (N/A) as a surgical intervention.  The patient's history has been reviewed, patient examined, no change in status, stable for surgery.  I have reviewed the patient's chart and labs.  Questions were answered to the patient's satisfaction.     Carolyn Sylvia

## 2024-02-04 NOTE — Interval H&P Note (Signed)
 History and Physical Interval Note:  02/04/2024 9:55 AM  [name]  has presented today for surgery, with the diagnosis of AFIB.  The various methods of treatment have been discussed with the patient and family. After consideration of risks, benefits and other options for treatment, the patient has consented to  Procedure(s): TRANSESOPHAGEAL ECHOCARDIOGRAM (N/A) CARDIOVERSION (N/A) as a surgical intervention.  The patient's history has been reviewed, patient examined, no change in status, stable for surgery.  I have reviewed the patient's chart and labs.  Questions were answered to the patient's satisfaction.     Ginnie Marich

## 2024-02-04 NOTE — Discharge Instructions (Signed)

## 2024-02-04 NOTE — Progress Notes (Signed)
 I would do without TEE. This could very well be an ancient clot

## 2024-02-04 NOTE — Op Note (Signed)
 INDICATIONS: Atrial fibrillation  PROCEDURE:   Informed consent was obtained prior to the procedure. The risks, benefits and alternatives for the procedure were discussed and the patient comprehended these risks.  Risks include, but are not limited to, cough, sore throat, vomiting, nausea, somnolence, esophageal and stomach trauma or perforation, bleeding, low blood pressure, aspiration, pneumonia, infection, trauma to the teeth and death.    After a procedural time-out, the oropharynx was anesthetized with 20% benzocaine spray.   During this procedure the patient was administered IV propofol  by Anesthesiology, Dr. Leopoldo.  The transesophageal probe was inserted in the esophagus and stomach without difficulty and multiple views were obtained.  The patient was kept under observation until the patient left the procedure room.  The patient left the procedure room in stable condition.   Agitated microbubble saline contrast was administered.  COMPLICATIONS:    There were no immediate complications.  FINDINGS:  Severely dilated left atrium. There is severe spontaneous echo contrast in the entire left atrium, particularly severe in the appendage, which has a retroflexed chicken wing morphology. There is a small thrombus at the tip of the appendage. This appears fixed, but cannot distinguish whether it is a chronic organized thrombus or an acute thrombus. Outlined with Definity contrast.  RECOMMENDATIONS:    Cardioversion canceled, can be rescheduled after 3 weeks of uninterrupted anticoagulation  Time Spent Directly with the Patient:  45 minutes   [name] 02/04/2024, 10:46 AM

## 2024-02-04 NOTE — Transfer of Care (Signed)
 Immediate Anesthesia Transfer of Care Note  Patient: JARA FEIDER  Procedure(s) Performed: TRANSESOPHAGEAL ECHOCARDIOGRAM  Patient Location: PACU and Cath Lab  Anesthesia Type:MAC  Level of Consciousness: awake  Airway & Oxygen Therapy: Patient Spontanous Breathing and Patient connected to face mask oxygen  Post-op Assessment: Report given to RN and Post -op Vital signs reviewed and stable  Post vital signs: Reviewed and stable  Last Vitals:  Vitals Value Taken Time  BP 109/68 02/04/24 10:49  Temp 36.3 C 02/04/24 10:49  Pulse 88 02/04/24 10:49  Resp 25 02/04/24 10:49  SpO2 100 % 02/04/24 10:49  Vitals shown include unfiled device data.  Last Pain:  Vitals:   02/04/24 1049  TempSrc: Tympanic  PainSc: Asleep         Complications: No notable events documented.

## 2024-02-04 NOTE — Anesthesia Preprocedure Evaluation (Addendum)
 Anesthesia Evaluation  Patient identified by MRN, date of birth, ID band Patient awake    Reviewed: Allergy & Precautions, NPO status , Patient's Chart, lab work & pertinent test results  History of Anesthesia Complications Negative for: history of anesthetic complications  Airway Mallampati: III  TM Distance: <3 FB Neck ROM: Full    Dental  (+) Dental Advisory Given, Teeth Intact   Pulmonary sleep apnea    breath sounds clear to auscultation       Cardiovascular hypertension, Pt. on medications and Pt. on home beta blockers + CAD and + Past MI  + dysrhythmias + pacemaker  Rhythm:Irregular  1. Left ventricular ejection fraction, by estimation, is 60 to 65%. The  left ventricle has normal function. The left ventricle has no regional  wall motion abnormalities. Left ventricular diastolic function could not  be evaluated.   2. Right ventricular systolic function is normal. The right ventricular  size is mildly enlarged. There is normal pulmonary artery systolic  pressure. The estimated right ventricular systolic pressure is 30.0 mmHg.   3. Left atrial size was severely dilated.   4. Right atrial size was mildly dilated.   5. MVA 1.06 cm2 by continuity. The mitral valve is degenerative. Mild  mitral valve regurgitation. Moderate to severe mitral stenosis. The mean  mitral valve gradient is 8.7 mmHg with average heart rate of 90 bpm.  Severe mitral annular calcification.   6. The aortic valve is tricuspid. Aortic valve regurgitation is not  visualized. No aortic stenosis is present.   7. The inferior vena cava is normal in size with greater than 50%  respiratory variability, suggesting right atrial pressure of 3 mmHg.      Neuro/Psych  PSYCHIATRIC DISORDERS Anxiety Depression    negative neurological ROS     GI/Hepatic Neg liver ROS,GERD  Medicated and Controlled,,  Endo/Other  negative endocrine ROS    Renal/GU Renal  diseaseLab Results      Component                Value               Date                      NA                       138                 01/29/2024                K                        4.3                 01/29/2024                CO2                      20                  01/29/2024                GLUCOSE                  97                  01/29/2024  BUN                      26                  01/29/2024                CREATININE               1.23 (H)            01/29/2024                CALCIUM                   10.3                01/29/2024                GFR                      88.49               01/23/2014                EGFR                     45 (L)              01/29/2024                GFRNONAA                 55 (L)              01/19/2024                Musculoskeletal  (+) Arthritis ,    Abdominal   Peds  Hematology  (+) Blood dyscrasia Lab Results      Component                Value               Date                      WBC                      8.0                 01/29/2024                HGB                      15.0                01/29/2024                HCT                      47.0 (H)            01/29/2024                MCV                      100 (H)             01/29/2024                PLT  271                 01/29/2024             eliquis    Anesthesia Other Findings   Reproductive/Obstetrics                              Anesthesia Physical Anesthesia Plan  ASA: 3  Anesthesia Plan: MAC and General   Post-op Pain Management: Minimal or no pain anticipated   Induction: Intravenous  PONV Risk Score and Plan: 2 and Propofol  infusion and Treatment may vary due to age or medical condition  Airway Management Planned: Natural Airway and Simple Face Mask  Additional Equipment: None  Intra-op Plan:   Post-operative Plan:   Informed Consent: I have reviewed the  patients History and Physical, chart, labs and discussed the procedure including the risks, benefits and alternatives for the proposed anesthesia with the patient or authorized representative who has indicated his/her understanding and acceptance.     Dental advisory given  Plan Discussed with: CRNA  Anesthesia Plan Comments:          Anesthesia Quick Evaluation

## 2024-02-04 NOTE — Progress Notes (Signed)
  Echocardiogram Echocardiogram Transesophageal has been performed.  [name] 02/04/2024, 11:02 AM

## 2024-02-05 ENCOUNTER — Telehealth: Payer: Self-pay | Admitting: Cardiovascular Disease

## 2024-02-05 NOTE — Telephone Encounter (Signed)
 Patient called because she wasn't able to get her cardioversion done yesterday and she would like to reschedule.

## 2024-02-05 NOTE — Telephone Encounter (Signed)
 RN spoke to patient's husband ( per DPR) . Patient was not at home.   Per conversation with A. Tillery,PA-C- Patient is to continue with current medication  , and keep upcoming appt on 02/17/24 . It will be discussed about  rescheduling  cardioversion.   Richard ( husband ) verbalized understanding and states he will tell patient.

## 2024-02-08 ENCOUNTER — Encounter (HOSPITAL_COMMUNITY): Payer: Self-pay | Admitting: Cardiovascular Disease

## 2024-02-08 NOTE — Anesthesia Postprocedure Evaluation (Signed)
 Anesthesia Post Note  Patient: [name]  Procedure(s) Performed: TRANSESOPHAGEAL ECHOCARDIOGRAM     Patient location during evaluation: Cath Lab Anesthesia Type: MAC Level of consciousness: awake and alert Pain management: pain level controlled Vital Signs Assessment: post-procedure vital signs reviewed and stable Respiratory status: spontaneous breathing, nonlabored ventilation and respiratory function stable Cardiovascular status: stable and blood pressure returned to baseline Postop Assessment: no apparent nausea or vomiting Anesthetic complications: no   No notable events documented.                  Kevyn Wengert

## 2024-02-10 ENCOUNTER — Encounter: Payer: Self-pay | Admitting: Podiatry

## 2024-02-10 NOTE — Progress Notes (Signed)
 Subjective:  Patient ID: ODALIS JORDAN, female    DOB: 1946/03/08,  MRN: [id number]  KEMYA SHED presents to clinic today for painful mycotic toenails of both feet that are difficult to trim. Pain interferes with daily activities and wearing enclosed shoe gear comfortably.  Chief Complaint  Patient presents with   RFC     RFC Non diabetic toenail trim. LOV with PCP 10/ 14/25   New problem(s): None.   PCP is Dayna Motto, DO.  Allergies  Allergen Reactions   Azithromycin Swelling   Other Itching, Swelling, Rash and Cough    Horse products    Penicillins Swelling and Rash    Has patient had a PCN reaction causing immediate rash, facial/tongue/throat swelling, SOB or lightheadedness with hypotension: Yes Has patient had a PCN reaction causing severe rash involving mucus membranes or skin necrosis: No Has patient had a PCN reaction that required hospitalization: No Has patient had a PCN reaction occurring within the last 10 years: No If all of the above answers are NO, then may proceed with Cephalosporin use.     Tetanus Toxoid-Containing Vaccines Swelling    Other reaction(s): sickness   Gadolinium Derivatives Hives and Itching    Pt stated that her left arm was itching and I noticed that she had two hives on her chest. No difficulty breathing, sneezing. One 25mg  of benadryl was ordered by Dr. Trudy. Pt remained at facility and was monitored before going home. -ldrake   Morphine And Codeine Nausea And Vomiting   Horse-Derived Products     Other reaction(s): reaction   Latex    Penicillin G     Other reaction(s): Unknown   Tessalon [Benzonatate] Other (See Comments)    hypotension   Amoxicillin-Pot Clavulanate Swelling and Rash    Has patient had a PCN reaction causing immediate rash, facial/tongue/throat swelling, SOB or lightheadedness with hypotension: Yes Has patient had a PCN reaction causing severe rash involving mucus membranes or skin necrosis: Yes Has  patient had a PCN reaction that required hospitalization:No Has patient had a PCN reaction occurring within the last 10 years: No If all of the above answers are NO, then may proceed with Cephalosporin use.     Cefazolin Rash    Review of Systems: Negative except as noted in the HPI.  Objective: No changes noted in today's physical examination. There were no vitals filed for this visit. HUMAIRA SCULLEY is a pleasant 78 y.o. female WD, WN in NAD. AAO x 3.  Vascular Examination: Capillary refill time immediate b/l. Palpable pedal pulses. Pedal hair present b/l. No pain with calf compression b/l. Skin temperature gradient WNL b/l. No cyanosis or clubbing b/l. No ischemia or gangrene noted b/l.   Neurological Examination: Sensation grossly intact b/l with 10 gram monofilament. Vibratory sensation intact b/l.   Dermatological Examination: Pedal skin with normal turgor, texture and tone b/l.  No open wounds. No interdigital macerations.   Toenails 1-5 b/l thick, discolored, elongated with subungual debris and pain on dorsal palpation.   Incurvated nailplate left great toe medial border(s) with tenderness to palpation. No erythema, no edema, no drainage noted.  No corns, calluses nor porokeratotic lesions noted.  Musculoskeletal Examination: Muscle strength 5/5 to all lower extremity muscle groups bilaterally. No pain, crepitus or joint limitation noted with ROM bilateral LE. No gross bony deformities bilaterally.  Radiographs: None  Assessment/Plan: 1. Pain due to onychomycosis of toenails of both feet   -Patient was evaluated today. All questions/concerns addressed on  today's visit. -Patient to continue soft, supportive shoe gear daily. -Treatment was provided by assistant Andrez Manchester under my supervision. Toenails were debrided in length and girth 2-5 bilaterally with sterile nail nippers and dremel without iatrogenic bleeding. No invasive procedure(s) performed.  Offending nail border debrided and curretaged bilateral great toes by me utilizing sterile nail nipper and currette. Border cleansed with alcohol and triple antibiotic ointment applied. No further treatment required by patient/caregiver. Call office if there are any concerns. -Patient/POA to call should there be question/concern in the interim.   Return in about 3 months (around 05/05/2024).  Delon LITTIE Merlin, DPM      [place] LOCATION: [address], [place] [address]                   Office [phone]   [facility] LOCATION: [address] [place], [place] [address] Office [phone]

## 2024-02-17 ENCOUNTER — Ambulatory Visit: Admitting: Student

## 2024-02-22 NOTE — Progress Notes (Unsigned)
  Electrophysiology Office Note:   ID:  Rebecca, Tran [birth year], MRN [id number]  Primary Cardiologist: Vina Gull, MD Electrophysiologist: Eulas FORBES Furbish, MD  {Click to update primary MD,subspecialty MD or APP then REFRESH:1}    History of Present Illness:   [name] is a 79 y.o. female with h/o paroxysmal AF and interrmitent heart block s/p PPM 01/18/2024 seen today for routine electrophysiology followup.   Since last being seen in our clinic the patient reports doing ***.  she denies chest pain, palpitations, dyspnea, PND, orthopnea, nausea, vomiting, dizziness, syncope, edema, weight gain, or early satiety.   Review of systems complete and found to be negative unless listed in HPI.   EP Information / Studies Reviewed:    EKG is ordered today. ***       PPM Interrogation-  Brief check only showed ***.  Arrhythmia/Device History Abbott Dual Chamber PPM 01/18/2024 for tachy-brady   Physical Exam:   VS:  There were no vitals taken for this visit.   Wt Readings from Last 3 Encounters:  02/04/24 175 lb (79.4 kg)  01/29/24 178 lb 3.2 oz (80.8 kg)  01/15/24 185 lb 3 oz (84 kg)     GEN: No acute distress  NECK: No JVD; No carotid bruits CARDIAC: {EPRHYTHM:28826}, no murmurs, rubs, gallops RESPIRATORY:  Clear to auscultation without rales, wheezing or rhonchi  ABDOMEN: Soft, non-tender, non-distended EXTREMITIES:  {EDEMA LEVEL:28147::No} edema; No deformity   ASSESSMENT AND PLAN:    Tachy-Brady syndrome s/p Abbott PPM  Normal PPM function See Pace Art report No changes today  Persistent AF Presented for TEE/DCC 10/23 and found to have small thrombus.  Felt OK to proceed with [facility] after 3 weeks of uninterrupted OAC.  ***  HTN Stable on current regimen   {Click here to Review PMH, Prob List, Meds, Allergies, SHx, FHx  :1}   Disposition:   Follow up with {EPPROVIDERS:28135::EP Team} {EPFOLLOW UP:28173}  Signed, Ozell Prentice Passey, PA-C

## 2024-02-22 NOTE — H&P (View-Only) (Signed)
  Electrophysiology Office Note:   ID:  Rebecca, Tran [birth year], MRN [id number]  Primary Cardiologist: Vina Gull, MD Electrophysiologist: Eulas FORBES Furbish, MD  {Click to update primary MD,subspecialty MD or APP then REFRESH:1}    History of Present Illness:   [name] is a 79 y.o. female with h/o paroxysmal AF and interrmitent heart block s/p PPM 01/18/2024 seen today for routine electrophysiology followup.   Since last being seen in our clinic the patient reports doing ***.  she denies chest pain, palpitations, dyspnea, PND, orthopnea, nausea, vomiting, dizziness, syncope, edema, weight gain, or early satiety.   Review of systems complete and found to be negative unless listed in HPI.   EP Information / Studies Reviewed:    EKG is ordered today. ***       PPM Interrogation-  Brief check only showed ***.  Arrhythmia/Device History Abbott Dual Chamber PPM 01/18/2024 for tachy-brady   Physical Exam:   VS:  There were no vitals taken for this visit.   Wt Readings from Last 3 Encounters:  02/04/24 175 lb (79.4 kg)  01/29/24 178 lb 3.2 oz (80.8 kg)  01/15/24 185 lb 3 oz (84 kg)     GEN: No acute distress  NECK: No JVD; No carotid bruits CARDIAC: {EPRHYTHM:28826}, no murmurs, rubs, gallops RESPIRATORY:  Clear to auscultation without rales, wheezing or rhonchi  ABDOMEN: Soft, non-tender, non-distended EXTREMITIES:  {EDEMA LEVEL:28147::No} edema; No deformity   ASSESSMENT AND PLAN:    Tachy-Brady syndrome s/p Abbott PPM  Normal PPM function See Pace Art report No changes today  Persistent AF Presented for TEE/DCC 10/23 and found to have small thrombus.  Felt OK to proceed with [facility] after 3 weeks of uninterrupted OAC.  ***  HTN Stable on current regimen   {Click here to Review PMH, Prob List, Meds, Allergies, SHx, FHx  :1}   Disposition:   Follow up with {EPPROVIDERS:28135::EP Team} {EPFOLLOW UP:28173}  Signed, Ozell Prentice Passey, PA-C

## 2024-02-23 ENCOUNTER — Encounter: Payer: Self-pay | Admitting: Student

## 2024-02-23 ENCOUNTER — Ambulatory Visit: Attending: Student | Admitting: Student

## 2024-02-23 ENCOUNTER — Encounter: Payer: Self-pay | Admitting: *Deleted

## 2024-02-23 VITALS — BP 167/96 | HR 72 | Ht 62.0 in | Wt 188.2 lb

## 2024-02-23 DIAGNOSIS — I4819 Other persistent atrial fibrillation: Secondary | ICD-10-CM

## 2024-02-23 DIAGNOSIS — I10 Essential (primary) hypertension: Secondary | ICD-10-CM

## 2024-02-23 DIAGNOSIS — I4891 Unspecified atrial fibrillation: Secondary | ICD-10-CM

## 2024-02-23 DIAGNOSIS — I5032 Chronic diastolic (congestive) heart failure: Secondary | ICD-10-CM | POA: Diagnosis not present

## 2024-02-23 MED ORDER — AMIODARONE HCL 200 MG PO TABS: 200.0000 mg | ORAL_TABLET | Freq: Every day | ORAL | 3 refills | Status: DC

## 2024-02-23 MED ORDER — AMIODARONE HCL 200 MG PO TABS: 200.0000 mg | ORAL_TABLET | Freq: Every day | ORAL | 3 refills | Status: AC

## 2024-02-23 NOTE — Patient Instructions (Signed)
 Medication Instructions:  Decrease amiodarone to 200 mg daily *If you need a refill on your cardiac medications before your next appointment, please call your pharmacy*  Lab Work: BMET, CBC-TODAY If you have labs (blood work) drawn today and your tests are completely normal, you will receive your results only by: MyChart Message (if you have MyChart) OR A paper copy in the mail If you have any lab test that is abnormal or we need to change your treatment, we will call you to review the results.  Testing/Procedures: Your physician has recommended that you have a Cardioversion (DCCV). Electrical Cardioversion uses a jolt of electricity to your heart either through paddles or wired patches attached to your chest. This is a controlled, usually prescheduled, procedure. Defibrillation is done under light anesthesia in the hospital, and you usually go home the day of the procedure. This is done to get your heart back into a normal rhythm. You are not awake for the procedure. Please see the instruction sheet given to you today.   Follow-Up: At [facility], you and your health needs are our priority.  As part of our continuing mission to provide you with exceptional heart care, our providers are all part of one team.  This team includes your primary Cardiologist (physician) and Advanced Practice Providers or APPs (Physician Assistants and Nurse Practitioners) who all work together to provide you with the care you need, when you need it.  Your next appointment:   As scheduled

## 2024-02-24 ENCOUNTER — Ambulatory Visit: Payer: Self-pay | Admitting: Student

## 2024-02-24 LAB — CBC
Hematocrit: 40.8 % (ref 34.0–46.6)
Hemoglobin: 13 g/dL (ref 11.1–15.9)
MCH: 31.8 pg (ref 26.6–33.0)
MCHC: 31.9 g/dL (ref 31.5–35.7)
MCV: 100 fL — ABNORMAL HIGH (ref 79–97)
Platelets: 171 x10E3/uL (ref 150–450)
RBC: 4.09 x10E6/uL (ref 3.77–5.28)
RDW: 12.7 % (ref 11.7–15.4)
WBC: 5.5 x10E3/uL (ref 3.4–10.8)

## 2024-02-24 LAB — BASIC METABOLIC PANEL WITH GFR
BUN/Creatinine Ratio: 14 (ref 12–28)
BUN: 15 mg/dL (ref 8–27)
CO2: 23 mmol/L (ref 20–29)
Calcium: 9.3 mg/dL (ref 8.7–10.3)
Chloride: 106 mmol/L (ref 96–106)
Creatinine, Ser: 1.08 mg/dL — ABNORMAL HIGH (ref 0.57–1.00)
Glucose: 94 mg/dL (ref 70–99)
Potassium: 3.8 mmol/L (ref 3.5–5.2)
Sodium: 142 mmol/L (ref 134–144)
eGFR: 53 mL/min/1.73 — ABNORMAL LOW (ref >59)

## 2024-02-26 ENCOUNTER — Other Ambulatory Visit (HOSPITAL_COMMUNITY): Payer: Self-pay

## 2024-02-26 NOTE — Progress Notes (Signed)
 Pt called for pre procedure instructions.  Message left on identified machine, encouraged to call back for questions. Arrival time 0730 NPO after midnight explained Instructed to take am meds with sip of water and confirmed blood thinner consistency Instructed pt need for ride home tomorrow and have responsible adult with them for 24 hrs post procedure.

## 2024-02-29 ENCOUNTER — Encounter (HOSPITAL_COMMUNITY)
Admission: RE | Disposition: A | Discharge status: Home / Self Care | Payer: Self-pay | Source: Home / Self Care | Attending: Cardiovascular Disease

## 2024-02-29 ENCOUNTER — Other Ambulatory Visit: Payer: Self-pay

## 2024-02-29 ENCOUNTER — Encounter (HOSPITAL_COMMUNITY): Payer: Self-pay | Admitting: Cardiovascular Disease

## 2024-02-29 ENCOUNTER — Encounter: Payer: Self-pay | Admitting: Internal Medicine

## 2024-02-29 ENCOUNTER — Ambulatory Visit (HOSPITAL_COMMUNITY): Admitting: Anesthesiology

## 2024-02-29 ENCOUNTER — Ambulatory Visit (HOSPITAL_COMMUNITY)
Admission: RE | Admit: 2024-02-29 | Discharge: 2024-02-29 | Disposition: A | Discharge status: Home / Self Care | Attending: Cardiovascular Disease | Admitting: Cardiovascular Disease

## 2024-02-29 DIAGNOSIS — I4819 Other persistent atrial fibrillation: Secondary | ICD-10-CM | POA: Insufficient documentation

## 2024-02-29 DIAGNOSIS — I495 Sick sinus syndrome: Secondary | ICD-10-CM | POA: Insufficient documentation

## 2024-02-29 DIAGNOSIS — Z79899 Other long term (current) drug therapy: Secondary | ICD-10-CM | POA: Insufficient documentation

## 2024-02-29 DIAGNOSIS — I4891 Unspecified atrial fibrillation: Secondary | ICD-10-CM

## 2024-02-29 DIAGNOSIS — Z539 Procedure and treatment not carried out, unspecified reason: Secondary | ICD-10-CM | POA: Insufficient documentation

## 2024-02-29 DIAGNOSIS — I10 Essential (primary) hypertension: Secondary | ICD-10-CM | POA: Diagnosis not present

## 2024-02-29 SURGERY — CARDIOVERSION (CATH LAB)
Anesthesia: Monitor Anesthesia Care

## 2024-02-29 MED ORDER — APIXABAN 5 MG PO TABS
ORAL_TABLET | ORAL | Status: AC
  Administered 2024-02-29: 5 mg via ORAL
  Filled 2024-02-29: qty 1

## 2024-02-29 MED ORDER — APIXABAN 5 MG PO TABS: 5.0000 mg | ORAL_TABLET | Freq: Once | ORAL | Status: AC

## 2024-02-29 MED ORDER — SODIUM CHLORIDE 0.9 % IV SOLN: INTRAVENOUS | Status: DC

## 2024-02-29 NOTE — Interval H&P Note (Signed)
 History and Physical Interval Note:  02/29/2024 8:08 AM  [name]  has presented today for surgery, with the diagnosis of AFIB.  The various methods of treatment have been discussed with the patient and family. After consideration of risks, benefits and other options for treatment, the patient has consented to  Procedure(s): CARDIOVERSION (N/A) as a surgical intervention.  The patient's history has been reviewed, patient examined, no change in status, stable for surgery.  I have reviewed the patient's chart and labs.  Questions were answered to the patient's satisfaction.     Annabella Scarce, MD

## 2024-02-29 NOTE — Anesthesia Preprocedure Evaluation (Signed)
 Anesthesia Evaluation  Patient identified by MRN, date of birth, ID band Patient awake    Reviewed: Allergy & Precautions, NPO status , Patient's Chart, lab work & pertinent test results  History of Anesthesia Complications Negative for: history of anesthetic complications  Airway Mallampati: III  TM Distance: <3 FB Neck ROM: Full    Dental  (+) Dental Advisory Given, Teeth Intact   Pulmonary sleep apnea    breath sounds clear to auscultation       Cardiovascular hypertension, Pt. on medications and Pt. on home beta blockers + CAD and + Past MI  + dysrhythmias + pacemaker  Rhythm:Irregular  1. Left ventricular ejection fraction, by estimation, is 60 to 65%. The  left ventricle has normal function. The left ventricle has no regional  wall motion abnormalities. Left ventricular diastolic function could not  be evaluated.   2. Right ventricular systolic function is normal. The right ventricular  size is mildly enlarged. There is normal pulmonary artery systolic  pressure. The estimated right ventricular systolic pressure is 30.0 mmHg.   3. Left atrial size was severely dilated.   4. Right atrial size was mildly dilated.   5. MVA 1.06 cm2 by continuity. The mitral valve is degenerative. Mild  mitral valve regurgitation. Moderate to severe mitral stenosis. The mean  mitral valve gradient is 8.7 mmHg with average heart rate of 90 bpm.  Severe mitral annular calcification.   6. The aortic valve is tricuspid. Aortic valve regurgitation is not  visualized. No aortic stenosis is present.   7. The inferior vena cava is normal in size with greater than 50%  respiratory variability, suggesting right atrial pressure of 3 mmHg.      Neuro/Psych  PSYCHIATRIC DISORDERS Anxiety Depression    negative neurological ROS     GI/Hepatic Neg liver ROS,GERD  Medicated and Controlled,,  Endo/Other  negative endocrine ROS    Renal/GU Renal  diseaseLab Results      Component                Value               Date                      NA                       142                 02/23/2024                K                        3.8                 02/23/2024                CO2                      23                  02/23/2024                GLUCOSE                  94                  02/23/2024  BUN                      15                  02/23/2024                CREATININE               1.08 (H)            02/23/2024                CALCIUM                   9.3                 02/23/2024                GFR                      88.49               01/23/2014                EGFR                     53 (L)              02/23/2024                GFRNONAA                 55 (L)              01/19/2024                 Musculoskeletal  (+) Arthritis ,    Abdominal   Peds  Hematology  (+) Blood dyscrasia Lab Results      Component                Value               Date                      WBC                      5.5                 02/23/2024                HGB                      13.0                02/23/2024                HCT                      40.8                02/23/2024                MCV                      100 (H)             02/23/2024                PLT  171                 02/23/2024              eliquis    Anesthesia Other Findings   Reproductive/Obstetrics                              Anesthesia Physical Anesthesia Plan  ASA: 3  Anesthesia Plan: General   Post-op Pain Management: Minimal or no pain anticipated   Induction: Intravenous  PONV Risk Score and Plan: 3 and Treatment may vary due to age or medical condition  Airway Management Planned: Simple Face Mask, Nasal Cannula and Natural Airway  Additional Equipment: None  Intra-op Plan:   Post-operative Plan:   Informed Consent: I have reviewed the patients History  and Physical, chart, labs and discussed the procedure including the risks, benefits and alternatives for the proposed anesthesia with the patient or authorized representative who has indicated his/her understanding and acceptance.     Dental advisory given  Plan Discussed with: CRNA  Anesthesia Plan Comments:          Anesthesia Quick Evaluation

## 2024-02-29 NOTE — H&P (Signed)
 Patient presented for cardioversion and was in sinus rhythm.  Per her device interrogation, she self-converted this AM.  [name] C. Raford, MD, [facility] 02/29/2024 9:53 AM

## 2024-03-01 MED ORDER — METOPROLOL SUCCINATE ER 50 MG PO TB24: 50.0000 mg | ORAL_TABLET | Freq: Every day | ORAL | 3 refills | Status: AC

## 2024-03-02 ENCOUNTER — Ambulatory Visit

## 2024-03-02 DIAGNOSIS — R001 Bradycardia, unspecified: Secondary | ICD-10-CM | POA: Diagnosis not present

## 2024-03-02 NOTE — Telephone Encounter (Signed)
 1  We will be able to tack when she is in SR or afib through the pacemaker Keep on anticoagulation  2  Why does she want to stop lisinopril ?  Her blood pressures are still too high

## 2024-03-04 NOTE — Progress Notes (Signed)
 Remote PPM Transmission

## 2024-03-08 LAB — CUP PACEART REMOTE DEVICE CHECK
Battery Remaining Longevity: 66 mo
Battery Remaining Percentage: 95.5 %
Battery Voltage: 2.99 V
Brady Statistic AP VP Percent: 68 %
Brady Statistic AP VS Percent: 1 %
Brady Statistic AS VP Percent: 32 %
Brady Statistic AS VS Percent: 1 %
Brady Statistic RA Percent Paced: 10 %
Brady Statistic RV Percent Paced: 67 %
Date Time Interrogation Session: 20251120103227
Implantable Lead Connection Status: 753985
Implantable Lead Connection Status: 753985
Implantable Lead Implant Date: 20251006
Implantable Lead Implant Date: 20251006
Implantable Lead Location: 753859
Implantable Lead Location: 753860
Implantable Pulse Generator Implant Date: 20251006
Lead Channel Impedance Value: 400 Ohm
Lead Channel Impedance Value: 530 Ohm
Lead Channel Pacing Threshold Amplitude: 0.75 V
Lead Channel Pacing Threshold Amplitude: 0.75 V
Lead Channel Pacing Threshold Pulse Width: 0.5 ms
Lead Channel Pacing Threshold Pulse Width: 0.5 ms
Lead Channel Sensing Intrinsic Amplitude: 10.1 mV
Lead Channel Sensing Intrinsic Amplitude: 2.2 mV
Lead Channel Setting Pacing Amplitude: 3.5 V
Lead Channel Setting Pacing Amplitude: 3.5 V
Lead Channel Setting Pacing Pulse Width: 0.5 ms
Lead Channel Setting Sensing Sensitivity: 2 mV
Pulse Gen Model: 2272
Pulse Gen Serial Number: 8276807

## 2024-03-14 ENCOUNTER — Ambulatory Visit: Payer: Self-pay | Admitting: Cardiovascular Disease

## 2024-03-27 ENCOUNTER — Encounter: Payer: Self-pay | Admitting: Internal Medicine

## 2024-03-28 MED ORDER — LISINOPRIL 20 MG PO TABS: 20.0000 mg | ORAL_TABLET | Freq: Every day | ORAL | 3 refills | Status: AC

## 2024-03-28 NOTE — Telephone Encounter (Signed)
 Blood pressure is high   I would recomm increasing lisinopril  to 20 mg     Keep track of BP and mychart responses in a few weeks

## 2024-04-19 ENCOUNTER — Ambulatory Visit: Admitting: Podiatry

## 2024-04-19 ENCOUNTER — Ambulatory Visit: Admitting: Student

## 2024-04-21 ENCOUNTER — Other Ambulatory Visit: Payer: Self-pay | Admitting: Internal Medicine

## 2024-04-21 DIAGNOSIS — I5032 Chronic diastolic (congestive) heart failure: Secondary | ICD-10-CM

## 2024-04-21 DIAGNOSIS — I2510 Atherosclerotic heart disease of native coronary artery without angina pectoris: Secondary | ICD-10-CM

## 2024-04-22 ENCOUNTER — Other Ambulatory Visit (HOSPITAL_COMMUNITY): Payer: Self-pay

## 2024-04-22 ENCOUNTER — Encounter: Payer: Self-pay | Admitting: Podiatry

## 2024-04-22 ENCOUNTER — Ambulatory Visit: Admitting: Podiatry

## 2024-04-22 DIAGNOSIS — B351 Tinea unguium: Secondary | ICD-10-CM

## 2024-04-22 DIAGNOSIS — M19072 Primary osteoarthritis, left ankle and foot: Secondary | ICD-10-CM | POA: Diagnosis not present

## 2024-04-22 DIAGNOSIS — Z7901 Long term (current) use of anticoagulants: Secondary | ICD-10-CM

## 2024-04-22 DIAGNOSIS — M79675 Pain in left toe(s): Secondary | ICD-10-CM | POA: Diagnosis not present

## 2024-04-22 DIAGNOSIS — M19071 Primary osteoarthritis, right ankle and foot: Secondary | ICD-10-CM | POA: Diagnosis not present

## 2024-04-22 DIAGNOSIS — M79674 Pain in right toe(s): Secondary | ICD-10-CM | POA: Diagnosis not present

## 2024-04-22 NOTE — Progress Notes (Signed)
"  °  Subjective:  Patient ID: [name], female    DOB: 02-08-1946,   MRN: [id number]  Chief Complaint  Patient presents with   Nail Problem    Trim toenails and take care of the ingrown toenails.    79 y.o. female presents for concern of thickened elongated and painful nails that are difficult to trim. Requesting to have them trimmed today. She is on chronic anticoagulation and at risk for foot care  Patient did relate some concerns of arthirits in both feet. She is using voltaren and taking tyelnol. She belives this helps some and wondering what can be done.   PCP:  Dayna Motto, DO    . Denies any other pedal complaints. Denies n/v/f/c.   Past Medical History:  Diagnosis Date   Anxiety    Arthritis    Bone spur    Coronary artery disease    stent - 1999   Depression    Fibrocystic breast changes    GERD (gastroesophageal reflux disease)    Glucosuria    Hernia, inguinal, left 10/2002   Hypertension    Melanoma (HCC) 10/2008   right shoulder and arm   Menorrhagia    Myocardial infarction (HCC) 1999    Objective:  Physical Exam: Vascular: DP/PT pulses 2/4 bilateral. CFT <3 seconds. Feet cold to touch. Absent hair growth on digits. Edema noted to bilateral lower extremities. Xerosis noted bilaterally.  Skin. No lacerations or abrasions bilateral feet. Nails 1-5 bilateral  are thickened discolored and elongated with subungual debris.  Musculoskeletal: MMT 5/5 bilateral lower extremities in DF, PF, Inversion and Eversion. Deceased ROM in DF of ankle joint. Mild tenderness in midfoot in area of medial tarsometatarsal joints  Neurological: Sensation intact to light touch. Protective sensation  intact  bilateral.    Assessment:   1. Pain due to onychomycosis of toenails of both feet   2. Chronic anticoagulation   3. Arthritis of left midfoot   4. Arthritis of right midfoot      Plan:  Patient was evaluated and treated and all questions answered. -Mechanically  debrided all nails 1-5 bilateral using sterile nail nipper and filed with dremel without incident  -Answered all patient questions -Patient to return  in 3 months for at risk foot care -Patient advised to call the office if any problems or questions arise in the meantime.  Discussed midfoot arthritis with patient and treatment options.  X-rays reviewed. No acute fractures or dislocations. Degenerative changes noted throughout midfoot.  Discussed NSAIDS, topicals, and possible injections.  Continue voltaren. Maybe consider stopping tylenol  to see if this acutally helps.  Discussed stiff soled shoes and carbon fiber foot plate.  Discussed if pain does not improve can discuss surgical options or injections.  Patient to follow-up as needed.     Asberry Failing, DPM    "

## 2024-05-12 ENCOUNTER — Ambulatory Visit: Admitting: Student

## 2024-06-01 ENCOUNTER — Encounter

## 2024-07-26 ENCOUNTER — Ambulatory Visit: Admitting: Podiatry

## 2024-07-29 ENCOUNTER — Ambulatory Visit: Admitting: Podiatry

## 2024-08-01 ENCOUNTER — Ambulatory Visit: Admitting: Physician Assistant

## 2024-08-03 ENCOUNTER — Ambulatory Visit: Admitting: Podiatry

## 2024-08-09 ENCOUNTER — Ambulatory Visit: Admitting: Student

## 2024-08-31 ENCOUNTER — Encounter

## 2024-11-30 ENCOUNTER — Encounter

## 2025-03-01 ENCOUNTER — Encounter

## 2025-05-31 ENCOUNTER — Encounter
